# Patient Record
Sex: Male | Born: 1945 | Race: Black or African American | Hispanic: No | Marital: Married | Smoking: Former smoker
Health system: Southern US, Community
[De-identification: ages and names within clinical notes are randomized; demographics above are authoritative.]

## PROBLEM LIST (undated history)

## (undated) DIAGNOSIS — I671 Cerebral aneurysm, nonruptured: Secondary | ICD-10-CM

## (undated) DIAGNOSIS — R7303 Prediabetes: Secondary | ICD-10-CM

## (undated) DIAGNOSIS — I10 Essential (primary) hypertension: Secondary | ICD-10-CM

## (undated) DIAGNOSIS — E039 Hypothyroidism, unspecified: Secondary | ICD-10-CM

## (undated) DIAGNOSIS — E119 Type 2 diabetes mellitus without complications: Secondary | ICD-10-CM

## (undated) DIAGNOSIS — R972 Elevated prostate specific antigen [PSA]: Secondary | ICD-10-CM

## (undated) DIAGNOSIS — Z7901 Long term (current) use of anticoagulants: Secondary | ICD-10-CM

## (undated) DIAGNOSIS — Z8719 Personal history of other diseases of the digestive system: Secondary | ICD-10-CM

## (undated) DIAGNOSIS — E785 Hyperlipidemia, unspecified: Secondary | ICD-10-CM

## (undated) DIAGNOSIS — F32A Depression, unspecified: Secondary | ICD-10-CM

## (undated) DIAGNOSIS — F419 Anxiety disorder, unspecified: Secondary | ICD-10-CM

## (undated) DIAGNOSIS — E079 Disorder of thyroid, unspecified: Secondary | ICD-10-CM

## (undated) HISTORY — DX: Elevated prostate specific antigen (PSA): R97.20

## (undated) HISTORY — DX: Long term (current) use of anticoagulants: Z79.01

## (undated) HISTORY — DX: Anxiety disorder, unspecified: F41.9

## (undated) HISTORY — DX: Hyperlipidemia, unspecified: E78.5

## (undated) HISTORY — PX: TONSILLECTOMY: SUR1361

## (undated) HISTORY — DX: Depression, unspecified: F32.A

---

## 2007-07-06 DIAGNOSIS — G459 Transient cerebral ischemic attack, unspecified: Secondary | ICD-10-CM

## 2007-07-06 HISTORY — DX: Transient cerebral ischemic attack, unspecified: G45.9

## 2011-10-11 DIAGNOSIS — I10 Essential (primary) hypertension: Secondary | ICD-10-CM | POA: Insufficient documentation

## 2011-10-11 DIAGNOSIS — E039 Hypothyroidism, unspecified: Secondary | ICD-10-CM | POA: Insufficient documentation

## 2011-10-11 DIAGNOSIS — F419 Anxiety disorder, unspecified: Secondary | ICD-10-CM | POA: Insufficient documentation

## 2011-10-11 DIAGNOSIS — G4700 Insomnia, unspecified: Secondary | ICD-10-CM | POA: Insufficient documentation

## 2013-10-31 DIAGNOSIS — R42 Dizziness and giddiness: Secondary | ICD-10-CM | POA: Insufficient documentation

## 2013-10-31 LAB — HM HEPATITIS C SCREENING LAB: HM Hepatitis Screen: POSITIVE

## 2015-01-08 LAB — HM COLONOSCOPY

## 2016-08-17 DIAGNOSIS — R457 State of emotional shock and stress, unspecified: Secondary | ICD-10-CM | POA: Insufficient documentation

## 2019-07-02 LAB — PSA: PSA: 4.39

## 2020-04-24 HISTORY — PX: CEREBRAL ANGIOGRAM: SHX1326

## 2020-05-27 DIAGNOSIS — G459 Transient cerebral ischemic attack, unspecified: Secondary | ICD-10-CM | POA: Insufficient documentation

## 2020-05-27 DIAGNOSIS — I671 Cerebral aneurysm, nonruptured: Secondary | ICD-10-CM | POA: Insufficient documentation

## 2020-08-08 HISTORY — PX: SP EMBOLIZATION INTRACRANIAL: HXRAD103

## 2020-08-08 HISTORY — PX: OTHER SURGICAL HISTORY: SHX169

## 2020-08-10 ENCOUNTER — Other Ambulatory Visit: Payer: Self-pay

## 2020-08-10 ENCOUNTER — Emergency Department
Admission: EM | Admit: 2020-08-10 | Discharge: 2020-08-10 | Disposition: A | Discharge status: Home / Self Care | Payer: Medicare Other | Attending: Emergency Medicine | Admitting: Emergency Medicine

## 2020-08-10 DIAGNOSIS — I10 Essential (primary) hypertension: Secondary | ICD-10-CM | POA: Insufficient documentation

## 2020-08-10 DIAGNOSIS — R339 Retention of urine, unspecified: Secondary | ICD-10-CM | POA: Insufficient documentation

## 2020-08-10 DIAGNOSIS — R1030 Lower abdominal pain, unspecified: Secondary | ICD-10-CM | POA: Diagnosis not present

## 2020-08-10 DIAGNOSIS — Z87891 Personal history of nicotine dependence: Secondary | ICD-10-CM | POA: Insufficient documentation

## 2020-08-10 HISTORY — DX: Disorder of thyroid, unspecified: E07.9

## 2020-08-10 HISTORY — DX: Essential (primary) hypertension: I10

## 2020-08-10 LAB — URINALYSIS, COMPLETE (UACMP) WITH MICROSCOPIC
Bacteria, UA: NONE SEEN
Bilirubin Urine: NEGATIVE
Glucose, UA: NEGATIVE mg/dL
Ketones, ur: NEGATIVE mg/dL
Leukocytes,Ua: NEGATIVE
Nitrite: NEGATIVE
Protein, ur: NEGATIVE mg/dL
Specific Gravity, Urine: 1.012 (ref 1.005–1.030)
Squamous Epithelial / HPF: NONE SEEN (ref 0–5)
pH: 6 (ref 5.0–8.0)

## 2020-08-10 NOTE — ED Provider Notes (Signed)
[facility] Emergency Department Provider Note   ____________________________________________   Event Date/Time   First MD Initiated Contact with Patient 08/10/20 1020     (approximate)  I have reviewed the triage vital signs and the nursing notes.   HISTORY  Chief Complaint Urinary Retention    HPI Christopher Arroyo is a 75 y.o. male the stated past medical history of hypertension and thyroid diseases presents for urinary retention over the past 24 hours.  Patient states that he was admitted to [facility] for a endarterectomy of his carotid and had a Foley placed for this procedure.  Patient states that when the Foley catheter came out he has peed once before discharge and then has been unable to urinate since last evening.  Patient states he does have a history of prostate issues and complains of suprapubic abdominal pain that he describes as "stretching" that does not radiate and is caused his abdomen to become swollen and hard.  Patient also relates that this pain radiates into his groin and penis.  Patient currently denies any vision changes, tinnitus, difficulty speaking, facial droop, sore throat, chest pain, shortness of breath, nausea/vomiting/diarrhea, dysuria, or weakness/numbness/paresthesias in any extremity         Past Medical History:  Diagnosis Date  . Hypertension   . Thyroid disease     There are no problems to display for this patient.   History reviewed. No pertinent surgical history.  Prior to Admission medications   Not on File    Allergies Patient has no known allergies.  History reviewed. No pertinent family history.  Social History Social History   Tobacco Use  . Smoking status: Former Smoker  Substance Use Topics  . Alcohol use: Not Currently    Review of Systems Constitutional: No fever/chills Eyes: No visual changes. ENT: No sore throat. Cardiovascular: Denies chest pain. Respiratory: Denies shortness of  breath. Gastrointestinal: Endorses abdominal pain.  No nausea, no vomiting.  No diarrhea. Genitourinary: Negative for dysuria.  Positive for urinary retention Musculoskeletal: Negative for acute arthralgias Skin: Negative for rash. Neurological: Negative for headaches, weakness/numbness/paresthesias in any extremity Psychiatric: Negative for suicidal ideation/homicidal ideation   ____________________________________________   PHYSICAL EXAM:  VITAL SIGNS: ED Triage Vitals  Enc Vitals Group     BP 08/10/20 1025 (!) 130/95     Pulse Rate 08/10/20 1025 85     Resp 08/10/20 1025 18     Temp --      Temp src --      SpO2 08/10/20 1025 100 %     Weight 08/10/20 1026 160 lb (72.6 kg)     Height 08/10/20 1026 5\' 8"  (1.727 m)     Head Circumference --      Peak Flow --      Pain Score 08/10/20 1025 7     Pain Loc --      Pain Edu? --      Excl. in GC? --    Constitutional: Alert and oriented. Well appearing and in no acute distress. Eyes: Conjunctivae are normal. PERRL. Head: Atraumatic. Nose: No congestion/rhinnorhea. Mouth/Throat: Mucous membranes are moist. Neck: No stridor Cardiovascular: Grossly normal heart sounds.  Good peripheral circulation. Respiratory: Normal respiratory effort.  No retractions. Gastrointestinal: Suprapubic abdominal distention with tenderness to palpation.  Soft. No distention. Musculoskeletal: No obvious deformities Neurologic:  Normal speech and language. No gross focal neurologic deficits are appreciated. Skin:  Skin is warm and dry. No rash noted. Psychiatric: Mood and affect are  normal. Speech and behavior are normal.  ____________________________________________   LABS (all labs ordered are listed, but only abnormal results are displayed)  Labs Reviewed  URINALYSIS, COMPLETE (UACMP) WITH MICROSCOPIC - Abnormal; Notable for the following components:      Result Value   Color, Urine YELLOW (*)    APPearance CLEAR (*)    Hgb urine dipstick  LARGE (*)    All other components within normal limits    PROCEDURES  Procedure(s) performed (including Critical Care):  .1-3 Lead EKG Interpretation Performed by: Merwyn Katos, MD Authorized by: Merwyn Katos, MD     Interpretation: normal     ECG rate:  84   ECG rate assessment: normal     Rhythm: sinus rhythm     Ectopy: none     Conduction: normal       ____________________________________________   INITIAL IMPRESSION / ASSESSMENT AND PLAN / ED COURSE  As part of my medical decision making, I reviewed the following data within the electronic MEDICAL RECORD NUMBER Nursing notes reviewed and incorporated, Labs reviewed, Old chart reviewed, and Notes from prior ED visits reviewed and incorporated        75 year old male with one day of acute urinary retention. Well appearing/nonseptic. Tolerating PO.  ED Workup: UA ED Interventions: Catheter placement  Patient exam and history not consistent with cauda equina, infectious etiology, constipation based retention/intraabdominal mass/AAA, trauma, nephro/urolithiasis, drug reaction, cancer.  Disposition: Discharge with catheter placed to leg bag and urology follow up within 1 week. Strict return precautions and catheter care discussed.      ____________________________________________   FINAL CLINICAL IMPRESSION(S) / ED DIAGNOSES  Final diagnoses:  Urinary retention     ED Discharge Orders    None       Note:  This document was prepared using Dragon voice recognition software and may include unintentional dictation errors.   Merwyn Katos, MD 08/10/20 [phone]

## 2020-08-10 NOTE — ED Triage Notes (Signed)
Pt arrives to ER via [facility] for urinary retention. Had surgery yest at [facility]. Had a foley, removed, was able to urinate before being DC. States last urination was 3pm yesterday. A&O, ambulatory. Hx prostate issues. States abd swollen and hard. States groin and penis pain as well. EMS VSS. 124/91, HR 56, 97% RA

## 2020-08-13 ENCOUNTER — Telehealth: Payer: Self-pay

## 2020-08-13 ENCOUNTER — Encounter: Payer: Self-pay | Admitting: Urology

## 2020-08-13 ENCOUNTER — Ambulatory Visit (INDEPENDENT_AMBULATORY_CARE_PROVIDER_SITE_OTHER): Payer: Medicare Other | Admitting: Physician Assistant

## 2020-08-13 ENCOUNTER — Other Ambulatory Visit: Payer: Self-pay

## 2020-08-13 ENCOUNTER — Ambulatory Visit: Payer: Medicare Other | Admitting: Physician Assistant

## 2020-08-13 VITALS — BP 160/104 | HR 71 | Ht 68.0 in | Wt 160.0 lb

## 2020-08-13 DIAGNOSIS — N401 Enlarged prostate with lower urinary tract symptoms: Secondary | ICD-10-CM

## 2020-08-13 DIAGNOSIS — N138 Other obstructive and reflux uropathy: Secondary | ICD-10-CM

## 2020-08-13 DIAGNOSIS — R3129 Other microscopic hematuria: Secondary | ICD-10-CM | POA: Diagnosis not present

## 2020-08-13 DIAGNOSIS — R972 Elevated prostate specific antigen [PSA]: Secondary | ICD-10-CM | POA: Diagnosis not present

## 2020-08-13 DIAGNOSIS — R339 Retention of urine, unspecified: Secondary | ICD-10-CM

## 2020-08-13 NOTE — Progress Notes (Signed)
Patient returned to clinic this afternoon with reports of difficulty urinating and lower abdominal discomfort.  He was offered Foley catheter replacement versus CIC teaching and elected for the former, see separate procedure note below.  He reported resolution of lower abdominal pain with Foley placement. We will plan for repeat voiding trial in 1 week on Flomax 0.8mg  daily (previously 0.4mg ).  Simple Catheter Placement  Due to urinary retention patient is present today for a foley cath placement.  Patient was cleaned and prepped in a sterile fashion with betadine. A 16 FR coude foley catheter was inserted, urine return was noted  >357ml, urine was yellow in color.  The balloon was filled with 10cc of sterile water.  A leg bag was attached for drainage. Patient tolerated well, no complications were noted   Performed by: Eligha Bridegroom, CMA  Additional notes/ Follow up: Repeat VT 1 week, double Flomax

## 2020-08-13 NOTE — Progress Notes (Signed)
08/13/2020 9:16 AM   Christopher Arroyo [birth year] [id number]  Referring provider: No referring provider defined for this encounter.  Chief Complaint  Patient presents with  . Urinary Retention    HPI: 75 year old gentleman status post endarterectomy who developed postoperative urinary retention.  He presented to the emergency room on 08/10/2020 unable to urinate.  He was able to urinate x1 prior to being discharged but then had difficulty thereafter.  He did have a Foley intraoperatively.  Urinalysis in the emergency room showed no evidence of infection, he did have incidental 6-10 red blood cells per high-power field otherwise unremarkable.  He reports that he was started on Flomax by his PCP about a year ago.  He reports that he was having occasional difficulty getting his stream started at the time.  His not had any recent issues.  He also mentions that that he was told his PSA was started to go up a little bit last year.  He is not seen his PCP this year to have this rechecked.  Review of records indicate that his PSA has been as high as 4.97 on 05/2018.  This is slowly trending back downwards to 4.02 on 06/24/2019.  He is never previously seen a urologist.  He is very anxious to have his catheter removed.  Has been leaking around the catheter.  Voiding trial today was completed, only 100 cc was able to be instilled he had a large bladder spasm and empties completely.   PMH: Past Medical History:  Diagnosis Date  . Hypertension   . Thyroid disease     Surgical History: History reviewed. No pertinent surgical history.  Home Medications:  Allergies as of 08/13/2020   No Known Allergies     Medication List       Accurate as of August 13, 2020  9:16 AM. If you have any questions, ask your nurse or doctor.        aspirin 81 MG chewable tablet Chew by mouth.   citalopram 20 MG tablet Commonly known as: CELEXA Take by mouth.   clopidogrel 75 MG tablet Commonly  known as: PLAVIX PLEASE SEE ATTACHED FOR DETAILED DIRECTIONS   Fluoxetine HCl (PMDD) 10 MG Tabs Take by mouth.   levothyroxine 75 MCG tablet Commonly known as: SYNTHROID PLEASE SEE ATTACHED FOR DETAILED DIRECTIONS   lisinopril-hydrochlorothiazide 20-25 MG tablet Commonly known as: ZESTORETIC Take 1 tablet by mouth daily.   Multi-Vitamin tablet Take 1 tablet by mouth daily.   oxyCODONE 5 MG immediate release tablet Commonly known as: Oxy IR/ROXICODONE Take by mouth.   rosuvastatin 5 MG tablet Commonly known as: CRESTOR Take 5 mg by mouth daily.   tamsulosin 0.4 MG Caps capsule Commonly known as: FLOMAX TAKE 1 CAPSULE BY MOUTH ONCE DAILY. TAKE 30 MINUTES AFTER SAME MEAL EACH DAY   traZODone 50 MG tablet Commonly known as: DESYREL TAKE 1 - 2 TABLETS BY MOUTH NIGHTLY.       Allergies: No Known Allergies  Family History: History reviewed. No pertinent family history.  Social History:  reports that he has quit smoking. He does not have any smokeless tobacco history on file. He reports previous alcohol use. No history on file for drug use.   Physical Exam: BP (!) 160/104   Pulse 71   Ht 5\' 8"  (1.727 m)   Wt 160 lb (72.6 kg)   BMI 24.33 kg/m   Constitutional:  Alert and oriented, No acute distress. HEENT: [place] AT, moist mucus membranes.  Trachea midline, no  masses. Cardiovascular: No clubbing, cyanosis, or edema. Respiratory: Normal respiratory effort, no increased work of breathing. Skin: No rashes, bruises or suspicious lesions. Neurologic: Grossly intact, no focal deficits, moving all 4 extremities. Psychiatric: Normal mood and affect.  Assessment & Plan:    1. Urinary retention Postoperative urinary retention likely multifactorial including underlying history of BPH  Successful voiding trial today, strict urinary retention precautions reviewed.  Advised to return to our clinic later this afternoon is if if he is having any difficulty urinating  whatsoever.  Plan to reassess his urinary symptoms in a few weeks to assess for any underlying incomplete bladder emptying, uncontrolled symptoms, as well as update his prostate cancer screening especially in the setting of a previously elevated PSA.  He is agreeable this plan.  Continue Flomax   2. Benign prostatic hyperplasia with urinary obstruction As above  3. Elevated PSA As above  4. Microscopic hematuria Incidental microscopic hematuria in the emergency room, likely secondary to catheter trauma from previous Foley  We'll repeat urinalysis at next visit to ensure that he doesn't need a hematuria evaluation   Return to care in 4 weeks for IPSS/PSA/DRE/PVR/ UA    Vanna Scotland, MD  [facility] [address] [place], [place] [address] [phone]

## 2020-08-13 NOTE — Telephone Encounter (Signed)
Pt called stating he is unable to urinate, has strong urge but is only dribbling. Patient had V+T this morning with Dr. Apolinar Junes. Scheduled pt to come in at 2 pm . Pt verbalized understanding.

## 2020-08-13 NOTE — Progress Notes (Signed)
Fill and Pull Catheter Removal  Patient is present today for a catheter removal.  Patient was cleaned and prepped in a sterile fashion 100 ml of sterile water/ saline was instilled into the bladder when the patient felt the urge to urinate. 10 ml of water was then drained from the balloon.  A 16 FR foley cath was removed from the bladder no complications were noted .  Patient as then given some time to void on their own.  Patient can void  100 ml on their own after some time.  Patient tolerated well.  Performed by: Gerarda Gunther, RMA  Follow up/ Additional notes:

## 2020-08-18 ENCOUNTER — Ambulatory Visit: Payer: Medicare Other | Admitting: Physician Assistant

## 2020-08-18 ENCOUNTER — Ambulatory Visit: Payer: Medicare Other | Admitting: Urology

## 2020-08-19 ENCOUNTER — Other Ambulatory Visit: Payer: Self-pay

## 2020-08-19 ENCOUNTER — Ambulatory Visit (INDEPENDENT_AMBULATORY_CARE_PROVIDER_SITE_OTHER): Payer: Medicare Other | Admitting: Physician Assistant

## 2020-08-19 ENCOUNTER — Ambulatory Visit: Payer: Medicare Other | Admitting: Physician Assistant

## 2020-08-19 DIAGNOSIS — R339 Retention of urine, unspecified: Secondary | ICD-10-CM | POA: Diagnosis not present

## 2020-08-19 LAB — BLADDER SCAN AMB NON-IMAGING

## 2020-08-19 MED ORDER — TAMSULOSIN HCL 0.4 MG PO CAPS: 0.8000 mg | ORAL_CAPSULE | Freq: Every day | ORAL | 0 refills | Status: DC

## 2020-08-19 NOTE — Progress Notes (Signed)
08/19/2020 3:33 PM   Jon Lall [birth year] [id number]  CC: Chief Complaint  Patient presents with  . Urinary Retention    Voiding Trial    HPI: Christopher Arroyo is a 75 y.o. male who recently developed postoperative urinary retention following endarterectomy who presents today for repeat voiding trial.  He was first seen in clinic by Dr. Apolinar Junes 6 days ago for his first outpatient voiding trial, which he failed.  We have increased Flomax to 0.8 mg daily since that visit.    Today he reports moderate catheter discomfort. He has been tolerating increased Flomax without orthostasis.  PMH: Past Medical History:  Diagnosis Date  . Hypertension   . Thyroid disease     Surgical History: No past surgical history on file.  Home Medications:  Allergies as of 08/19/2020   No Known Allergies     Medication List       Accurate as of August 19, 2020  3:33 PM. If you have any questions, ask your nurse or doctor.        aspirin 81 MG chewable tablet Chew by mouth.   citalopram 20 MG tablet Commonly known as: CELEXA Take by mouth.   clopidogrel 75 MG tablet Commonly known as: PLAVIX PLEASE SEE ATTACHED FOR DETAILED DIRECTIONS   Fluoxetine HCl (PMDD) 10 MG Tabs Take by mouth.   levothyroxine 75 MCG tablet Commonly known as: SYNTHROID PLEASE SEE ATTACHED FOR DETAILED DIRECTIONS   lisinopril-hydrochlorothiazide 20-25 MG tablet Commonly known as: ZESTORETIC Take 1 tablet by mouth daily.   Multi-Vitamin tablet Take 1 tablet by mouth daily.   oxyCODONE 5 MG immediate release tablet Commonly known as: Oxy IR/ROXICODONE Take by mouth.   rosuvastatin 5 MG tablet Commonly known as: CRESTOR Take 5 mg by mouth daily.   tamsulosin 0.4 MG Caps capsule Commonly known as: FLOMAX Take 2 capsules (0.8 mg total) by mouth daily. What changed: See the new instructions. Changed by: [name], PA-C   traZODone 50 MG tablet Commonly known as: DESYREL TAKE 1 - 2  TABLETS BY MOUTH NIGHTLY.       Allergies:  No Known Allergies  Family History: No family history on file.  Social History:   reports that he has quit smoking. He does not have any smokeless tobacco history on file. He reports previous alcohol use. No history on file for drug use.  Physical Exam: There were no vitals taken for this visit.  Constitutional:  Alert and oriented, no acute distress, nontoxic appearing HEENT: [place], AT Cardiovascular: No clubbing, cyanosis, or edema Respiratory: Normal respiratory effort, no increased work of breathing Skin: No rashes, bruises or suspicious lesions Neurologic: Grossly intact, no focal deficits, moving all 4 extremities Psychiatric: Normal mood and affect  Laboratory Data: Results for orders placed or performed in visit on 08/19/20  BLADDER SCAN AMB NON-IMAGING  Result Value Ref Range   Scan Result    Assessment & Plan:   1. Urinary retention Only catheter removed in the morning, see separate procedure note for details.  Patient returned to clinic in the afternoon.  He reports drinking approximately 30 ounces of fluid.  He has been able to urinate.  PVR 193 mL.  Patient denies lower abdominal discomfort.  Voiding trial passed.  Counseled him to return to clinic with lower abdominal discomfort or the inability to void.  He expressed understanding.  Counseled him to continue Flomax 0.8 mg daily with plans for symptom recheck in 4 weeks with PVR, PSA, and DRE. -  BLADDER SCAN AMB NON-IMAGING - tamsulosin (FLOMAX) 0.4 MG CAPS capsule; Take 2 capsules (0.8 mg total) by mouth daily.  Dispense: 30 capsule; Refill: 0   Return in about 4 weeks (around 09/16/2020) for Symptom recheck with UA, PVR, PSA, DRE.  [name], PA-C  [facility] [address] [place], [place] [address] [phone]

## 2020-08-19 NOTE — Progress Notes (Signed)
Catheter Removal  Patient is present today for a catheter removal.  9ml of water was drained from the balloon. A 16FR coude foley cath was removed from the bladder no complications were noted . Patient tolerated well.  Performed by: Richanda Darin, PA-C   Follow up/ Additional notes: Push fluids and RTC this afternoon for PVR. 

## 2020-09-01 ENCOUNTER — Other Ambulatory Visit: Payer: Self-pay | Admitting: Physician Assistant

## 2020-09-01 DIAGNOSIS — R339 Retention of urine, unspecified: Secondary | ICD-10-CM

## 2020-09-02 DIAGNOSIS — E119 Type 2 diabetes mellitus without complications: Secondary | ICD-10-CM

## 2020-09-02 HISTORY — DX: Type 2 diabetes mellitus without complications: E11.9

## 2020-09-02 LAB — LIPID PANEL
Cholesterol: 211 — AB (ref 0–200)
HDL: 57 (ref 35–70)
LDL Cholesterol: 120
Triglycerides: 170 — AB (ref 40–160)

## 2020-09-10 ENCOUNTER — Encounter: Payer: Self-pay | Admitting: Urology

## 2020-09-10 ENCOUNTER — Ambulatory Visit: Payer: Self-pay | Admitting: Urology

## 2020-09-16 ENCOUNTER — Ambulatory Visit: Payer: Medicare Other | Admitting: Physician Assistant

## 2020-10-07 ENCOUNTER — Other Ambulatory Visit: Payer: Self-pay | Admitting: Physician Assistant

## 2020-10-07 DIAGNOSIS — R339 Retention of urine, unspecified: Secondary | ICD-10-CM

## 2020-10-29 ENCOUNTER — Ambulatory Visit: Payer: Medicare Other | Admitting: Internal Medicine

## 2020-11-14 ENCOUNTER — Ambulatory Visit (INDEPENDENT_AMBULATORY_CARE_PROVIDER_SITE_OTHER): Payer: Medicare Other | Admitting: Urology

## 2020-11-14 ENCOUNTER — Other Ambulatory Visit: Payer: Self-pay

## 2020-11-14 ENCOUNTER — Encounter: Payer: Self-pay | Admitting: Urology

## 2020-11-14 VITALS — BP 154/86 | HR 90

## 2020-11-14 DIAGNOSIS — R972 Elevated prostate specific antigen [PSA]: Secondary | ICD-10-CM

## 2020-11-14 DIAGNOSIS — R339 Retention of urine, unspecified: Secondary | ICD-10-CM | POA: Diagnosis not present

## 2020-11-14 DIAGNOSIS — R3129 Other microscopic hematuria: Secondary | ICD-10-CM

## 2020-11-14 DIAGNOSIS — N401 Enlarged prostate with lower urinary tract symptoms: Secondary | ICD-10-CM

## 2020-11-14 DIAGNOSIS — N138 Other obstructive and reflux uropathy: Secondary | ICD-10-CM

## 2020-11-14 LAB — BLADDER SCAN AMB NON-IMAGING: Scan Result: 507

## 2020-11-14 NOTE — Progress Notes (Addendum)
11/14/2020 11:06 AM   Christopher Arroyo 20-Apr-1946 [id number]  Referring provider: No referring provider defined for this encounter.  Chief Complaint  Patient presents with  . Urinary Retention        Urological history: 1. Urinary retention -postoperative urinary retention after an endarterectomy 08/2020  2. Elevated PSA -PSA Trend  4.02 in 06/2019  4.39 in 06/2018  4.97 in 05/2018  3.93 in 06/2017  3. BPH -managed with tamsulosin 0.8 mg daily  4. High risk hematuria -former smoker -micro heme with Foley catheter   HPI: Christopher Arroyo is a 75 y.o. male who presents today for the inability to urinate.    He had failed a voiding trial on 08/13/2020, but he had a successful trial on 08/19/2020.  He was instructed to return to the office in one month for a recheck on his UA, PSA, I PSS, PVR and exam, but he did not present for that appointment.     He was under the impression he was told that everything was fine and he did not need any further follow up.    He states that he had been having difficulty with urination, a weak urinary stream and lower back pain.  He states he has been taking the tamsulosin 0.4 mg, not the 0.8 mg that was recommended.  This morning he could not urinate.  He is now having intense suprapubic pain.    Patient denies any modifying or aggravating factors.  Patient denies any gross hematuria, dysuria or flank pain.  Patient denies any fevers, chills, nausea or vomiting.   His PVR is 507 mL.    PMH: Past Medical History:  Diagnosis Date  . Hypertension   . Thyroid disease     Surgical History: No past surgical history on file.  Home Medications:  Allergies as of 11/14/2020   No Known Allergies     Medication List       Accurate as of Nov 14, 2020 11:59 PM. If you have any questions, ask your nurse or doctor.        citalopram 20 MG tablet Commonly known as: CELEXA Take by mouth.   clopidogrel 75 MG tablet Commonly known as:  PLAVIX PLEASE SEE ATTACHED FOR DETAILED DIRECTIONS   Fluoxetine HCl (PMDD) 10 MG Tabs Take by mouth.   levothyroxine 75 MCG tablet Commonly known as: SYNTHROID PLEASE SEE ATTACHED FOR DETAILED DIRECTIONS   lisinopril-hydrochlorothiazide 20-25 MG tablet Commonly known as: ZESTORETIC Take 1 tablet by mouth daily.   Multi-Vitamin tablet Take 1 tablet by mouth daily.   oxyCODONE 5 MG immediate release tablet Commonly known as: Oxy IR/ROXICODONE Take by mouth.   rosuvastatin 5 MG tablet Commonly known as: CRESTOR Take 5 mg by mouth daily.   tamsulosin 0.4 MG Caps capsule Commonly known as: FLOMAX TAKE 2 CAPSULES BY MOUTH EVERY DAY   traZODone 50 MG tablet Commonly known as: DESYREL TAKE 1 - 2 TABLETS BY MOUTH NIGHTLY.       Allergies: No Known Allergies  Family History: No family history on file.  Social History:  reports that he has quit smoking. He has never used smokeless tobacco. He reports previous alcohol use. No history on file for drug use.  ROS: Pertinent ROS in HPI  Physical Exam: BP (!) 154/86   Pulse 90   Constitutional:  Well nourished. Alert and oriented, No acute distress. HEENT: [place] AT, mask in place.  Trachea midline Cardiovascular: No clubbing, cyanosis, or edema. Respiratory: Normal respiratory effort,  no increased work of breathing. GU: No CVA tenderness.  No bladder fullness or masses.  Patient with uncircumcised phallus. Foreskin easily retracted  Urethral meatus is patent.  No penile discharge. No penile lesions or rashes. Scrotum without lesions, cysts, rashes and/or edema.   Neurologic: Grossly intact, no focal deficits, moving all 4 extremities. Psychiatric: Normal mood and affect.  Laboratory Data: Hemoglobin A1C 4.8 - 5.6 % 6.5High   Comment: A1c Glycemic Goal: <7.0%   **Goals should be individualized; more or less stringent A1c glycemic goals may be appropriate for individual patients.    (Adopted from: 2020 ADA Standards of  Medical Care In Diabetes)  Point of Care A1c testing is not FDA-approved for the diagnosis of Diabetes.  Estimated Average Glucose mg/dL 417   Resulting Agency  [facility]   Narrative Performed by [facility] Screening or Diagnosis of Diabetes Mellitus*  A1c Reference Interval    Interpretation  4.8 - 5.6           Normal  5.7 - 6.4           Dysglycemia  >6.4             Diabetes Mellitus   *Not recommended for diagnosis of diabetes in children with Cystic Fibrosis or with symptoms suggestive of acute onset type 1 diabetes.    A1c Glycemic Goal: <7.0 %   **Goals should be individualized; more or less stringent A1c glycemic goals may be appropriate for individual patients.  (Adopted from: 2020 ADA Standards of Medical Care In Diabetes)  Specimen Collected: 09/02/20 14:24 Last Resulted: 09/02/20 17:26  Received From: [facility]  Result Received: 10/08/20 07:41   Triglycerides 0 - 150 mg/dL [id number]  [facility]  Cholesterol <=200 mg/dL [id number]  [facility]  HDL 40 - 60 mg/dL 57  [facility]  LDL Calculated 40 - 99 mg/dL [id number]  [facility]  Comment: NHLBI Recommended Ranges, LDL Cholesterol, for Adults (20+yrs) (ATPIII), mg/dL  Optimal       <858  Near Optimal    100-129  Borderline High   130-159  High        160-189  Very High      >=190  NHLBI Recommended Ranges, LDL Cholesterol, for Children (2-19 yrs), mg/dL  Desirable      <850  Borderline High   110-129  High         >=130  VLDL Cholesterol Cal 12 - 42 mg/dL 34  [facility]  Chol/HDL Ratio 1.0 - 4.5 3.7  [facility]  Non-HDL Cholesterol 70 - 130 mg/dL [id number]  [facility]  Comment: Non-HDL Cholesterol Recommended  Ranges (mg/dL)  Optimal   <786  Near Optimal 130 - 159  Borderline High 160 - 189  High      190 - 219  Very High   >220  FASTING  Unknown  [facility]  Specimen Collected: 09/02/20 14:24 Last Resulted: 09/02/20 16:57  Received From: [facility]  Result Received: 10/08/20 07:41   Urinalysis    Component Value Date/Time   COLORURINE YELLOW (A) 08/10/2020 1023   APPEARANCEUR Cloudy (A) 11/17/2020 1604   LABSPEC 1.012 08/10/2020 1023   PHURINE 6.0 08/10/2020 1023   GLUCOSEU Negative 11/17/2020 1604   HGBUR LARGE (A) 08/10/2020 1023   BILIRUBINUR Negative 11/17/2020 1604   KETONESUR NEGATIVE 08/10/2020 1023   PROTEINUR  1+ (A) 11/17/2020 1604   PROTEINUR NEGATIVE 08/10/2020 1023   NITRITE Negative 11/17/2020 1604   NITRITE NEGATIVE 08/10/2020 1023   LEUKOCYTESUR Trace (A) 11/17/2020 1604   LEUKOCYTESUR NEGATIVE 08/10/2020 1023  I have reviewed the labs.   Pertinent Imaging: Results for JACHOB, MCCLEAN (MRN [id number]) as of 11/24/2020 19:52  Ref. Range 11/14/2020 09:38  Scan Result Unknown [id number]     Simple Catheter Placement Due to urinary retention patient is present today for a foley cath placement.  Patient was cleaned and prepped in a sterile fashion with betadine. A 16 FR Coude foley catheter was inserted, urine return was noted  550 ml, urine was yellow in color.  The balloon was filled with 10cc of sterile water. Foreskin was pulled over glans.  A leg bag was attached for drainage. Patient was also given a night bag to take home and was given instruction on how to change from one bag to another.  Patient was given instruction on proper catheter care.  Patient tolerated well, no complications were noted    Assessment & Plan:    1. Urinary retention -explained to the patient that he was to return in March for a follow up visit and he needed to be taking tamsulosin 0.8 mg daily -Foley placed today -he is wanting further explanation as  to why he continues to have issues with urination and I have recommended that he undergo a cystoscopy and TRUS -I have explained to the patient that they will  be scheduled for a cystoscopy in our office to evaluate their bladder.  The cystoscopy consists of passing a tube with a lens up through their urethra and into their urinary bladder.   We will inject the urethra with a lidocaine gel prior to introducing the cystoscope to help with any discomfort during the procedure.   After the procedure, they might experience blood in the urine and discomfort with urination.  This will abate after the first few voids.  I have  encouraged the patient to increase water intake  during this time.  Patient denies any allergies to lidocaine.  -explained that the TRUS involved a rectal probe to ultrasound the prostate   2. Elevated PSA -patient still needs a follow up PSA  3. BPH -continue tamsulosin 0.8 mg daily -TRUS pending   4. High risk hematuria -micro heme likely due to catheter trauma -will need follow up UA at some point    Return for cysto/TRUS for urinary retention .  These notes generated with voice recognition software. I apologize for typographical errors.  Michiel Cowboy, PA-C  [facility] [address]  [address] [place], [place] [address] [phone]

## 2020-11-17 ENCOUNTER — Ambulatory Visit: Payer: Medicare Other | Admitting: Physician Assistant

## 2020-11-17 ENCOUNTER — Ambulatory Visit (INDEPENDENT_AMBULATORY_CARE_PROVIDER_SITE_OTHER): Payer: Medicare Other | Admitting: Physician Assistant

## 2020-11-17 ENCOUNTER — Other Ambulatory Visit: Payer: Self-pay

## 2020-11-17 VITALS — BP 142/78 | HR 71 | Temp 98.0°F | Ht 68.0 in | Wt 152.0 lb

## 2020-11-17 DIAGNOSIS — N3289 Other specified disorders of bladder: Secondary | ICD-10-CM | POA: Diagnosis not present

## 2020-11-17 LAB — BLADDER SCAN AMB NON-IMAGING

## 2020-11-17 NOTE — Progress Notes (Signed)
11/17/2020 1:12 PM   Christopher Arroyo Nov 04, 1945 [id number]  CC: Chief Complaint  Patient presents with  . Urinary Retention    HPI: Christopher Arroyo is a 75 y.o. male with PMH BPH, elevated PSA, and recurrent urinary retention who presents today for evaluation of bladder spasms.  He was seen in clinic most recently 3 days ago by Christopher Arroyo and was found to be in acute urinary retention.  Foley catheter was placed at that time.  Today he reports intermittent sensations of urinary urgency and burning at the tip of his penis associated with urine leaking around his Foley catheter tubing.  He feels he has no urinary control and is quite uncomfortable. He denies fever, chills, nausea, and vomiting. Bladder scan with 44mL with Foley in place.  He states he was voiding without difficulty until the morning that he was seen in clinic, when he suddenly lost the ability to void. He denies dysuria associated with this.  He has resumed Flomax 0.8 mg daily per Christopher Arroyo's instructions last week.  He had been taking 0.4 mg daily leading up to this, despite having previously been increased to 0.8mg  daily.  PMH: Past Medical History:  Diagnosis Date  . Hypertension   . Thyroid disease     Surgical History: No past surgical history on file.  Home Medications:  Allergies as of 11/17/2020   No Known Allergies     Medication List       Accurate as of Nov 17, 2020  1:12 PM. If you have any questions, ask your nurse or doctor.        citalopram 20 MG tablet Commonly known as: CELEXA Take by mouth.   clopidogrel 75 MG tablet Commonly known as: PLAVIX PLEASE SEE ATTACHED FOR DETAILED DIRECTIONS   Fluoxetine HCl (PMDD) 10 MG Tabs Take by mouth.   levothyroxine 75 MCG tablet Commonly known as: SYNTHROID PLEASE SEE ATTACHED FOR DETAILED DIRECTIONS   lisinopril-hydrochlorothiazide 20-25 MG tablet Commonly known as: ZESTORETIC Take 1 tablet by mouth daily.   Multi-Vitamin tablet Take  1 tablet by mouth daily.   oxyCODONE 5 MG immediate release tablet Commonly known as: Oxy IR/ROXICODONE Take by mouth.   rosuvastatin 5 MG tablet Commonly known as: CRESTOR Take 5 mg by mouth daily.   tamsulosin 0.4 MG Caps capsule Commonly known as: FLOMAX TAKE 2 CAPSULES BY MOUTH EVERY DAY   traZODone 50 MG tablet Commonly known as: DESYREL TAKE 1 - 2 TABLETS BY MOUTH NIGHTLY.       Allergies:  No Known Allergies  Family History: No family history on file.  Social History:   reports that he has quit smoking. He has never used smokeless tobacco. He reports previous alcohol use. No history on file for drug use.  Physical Exam: BP (!) 142/78   Pulse 71   Temp 98 F (36.7 C) (Oral)   Ht 5\' 8"  (1.727 m)   Wt 152 lb (68.9 kg)   SpO2 98%   BMI 23.11 kg/m   Constitutional:  Alert and oriented, no acute distress, nontoxic appearing HEENT: Christopher Arroyo, AT Cardiovascular: No clubbing, cyanosis, or edema Respiratory: Normal respiratory effort, no increased work of breathing Skin: No rashes, bruises or suspicious lesions Neurologic: Grossly intact, no focal deficits, moving all 4 extremities Psychiatric: Normal mood and affect  Laboratory Data: Results for orders placed or performed in visit on 11/17/20  BLADDER SCAN AMB NON-IMAGING  Result Value Ref Range   Scan Result 8mL    Scan Result  96mL    Assessment & Plan:   1. Bladder spasm Patient experiencing painful bladder spasms and urinary leakage following Foley catheter placement 3 days ago for management of recurrent acute urinary retention.  He is now back on previously prescribed Flomax 0.8 mg daily.  We discussed that his treatment options at this point include an early voiding trial versus initiation of anticholinergic medications for management of his bladder spasms.  Given that he has resumed twice daily Flomax, he would prefer to initiate a voiding trial and I think this is reasonable.  I removed his Foley catheter in  clinic this morning, see separate procedure note for details.    Patient returned to clinic this afternoon for repeat PVR.  He has been able to urinate. PVR WNL. Voiding trial passed. He reports some dysuria today that is progressively improving. Counseled patient to continue Flomax 0.8mg  daily and keep follow-up appt with Dr. Apolinar Arroyo for cystoscopy; he expressed understanding. Will send urine for culture today and treat per results if he has persistent dysuria. - BLADDER SCAN AMB NON-IMAGING - Urinalysis, Complete - CULTURE, URINE COMPREHENSIVE   Return if symptoms worsen or fail to improve.  Christopher Ching, PA-C  [facility] [address], [address] [place], [place] [address] [phone]

## 2020-11-17 NOTE — Patient Instructions (Signed)
Continue Flomax 0.8mg  (2 capsules) daily.

## 2020-11-17 NOTE — Progress Notes (Signed)
Catheter Removal  Patient is present today for a catheter removal.  9ml of water was drained from the balloon. A 16FR coude foley cath was removed from the bladder no complications were noted . Patient tolerated well.  Performed by: Makila Colombe, PA-C   Follow up/ Additional notes: Push fluids and RTC this afternoon for PVR. 

## 2020-11-20 LAB — URINALYSIS, COMPLETE
Bilirubin, UA: NEGATIVE
Glucose, UA: NEGATIVE
Ketones, UA: NEGATIVE
Nitrite, UA: NEGATIVE
Specific Gravity, UA: 1.02 (ref 1.005–1.030)
Urobilinogen, Ur: 0.2 mg/dL (ref 0.2–1.0)
pH, UA: 6 (ref 5.0–7.5)

## 2020-11-20 LAB — MICROSCOPIC EXAMINATION

## 2020-11-21 ENCOUNTER — Other Ambulatory Visit: Payer: Self-pay | Admitting: Physician Assistant

## 2020-11-21 LAB — CULTURE, URINE COMPREHENSIVE

## 2020-11-21 MED ORDER — SULFAMETHOXAZOLE-TRIMETHOPRIM 800-160 MG PO TABS: 1.0000 | ORAL_TABLET | Freq: Two times a day (BID) | ORAL | 0 refills | Status: AC

## 2020-11-21 NOTE — Progress Notes (Signed)
Sending in Bactrim DS BID x3 days to sterilize the urine in advance of scheduled cystoscopy next week. I notified the patient via telephone and he expressed understanding. He denies dysuria today and states he is doing well on Flomax 0.8mg  daily.

## 2020-11-24 DIAGNOSIS — G44009 Cluster headache syndrome, unspecified, not intractable: Secondary | ICD-10-CM | POA: Insufficient documentation

## 2020-11-24 DIAGNOSIS — B1920 Unspecified viral hepatitis C without hepatic coma: Secondary | ICD-10-CM | POA: Insufficient documentation

## 2020-11-24 DIAGNOSIS — R768 Other specified abnormal immunological findings in serum: Secondary | ICD-10-CM | POA: Insufficient documentation

## 2020-11-26 ENCOUNTER — Ambulatory Visit (INDEPENDENT_AMBULATORY_CARE_PROVIDER_SITE_OTHER): Payer: Medicare Other | Admitting: Urology

## 2020-11-26 ENCOUNTER — Other Ambulatory Visit: Payer: Self-pay

## 2020-11-26 VITALS — BP 117/78 | HR 67 | Ht 68.0 in | Wt 152.0 lb

## 2020-11-26 DIAGNOSIS — N138 Other obstructive and reflux uropathy: Secondary | ICD-10-CM

## 2020-11-26 DIAGNOSIS — N401 Enlarged prostate with lower urinary tract symptoms: Secondary | ICD-10-CM

## 2020-11-26 DIAGNOSIS — Z87898 Personal history of other specified conditions: Secondary | ICD-10-CM

## 2020-11-26 NOTE — Patient Instructions (Signed)

## 2020-11-26 NOTE — Progress Notes (Signed)
11/26/20  No chief complaint on file.    HPI: 75 year old male with refractory urinary symptoms related to BPH who presents today to the office for cystoscopy and prostate sizing.   Please see previous notes for details.    He developed urinary retention in February and underwent multiple voiding trials.  He developed recurrent episode of retention in May.  He has been on Flomax in the interim.  He continues to have refractory symptoms.   Vitals:   11/26/20 0942  BP: 117/78  Pulse: 67   NED. A&Ox3.   No respiratory distress   Abd soft, NT, ND Normal phallus with bilateral descended testicles    Cystoscopy Procedure Note  Patient identification was confirmed, informed consent was obtained, and patient was prepped using Betadine solution.  Lidocaine jelly was administered per urethral meatus.    Preoperative abx where received prior to procedure.     Pre-Procedure: - Inspection reveals a normal caliber ureteral meatus.  Procedure: The flexible cystoscope was introduced without difficulty - No urethral strictures/lesions are present. - Enlarged prostate trilobar coaptation - Normal bladder neck - Bilateral ureteral obscured secondary to median lobe and elevated bladder neck - Bladder mucosa  reveals mild resolving catheter cystitis and posterior bladder wall otherwise no erythema or tumors - No bladder stones -Moderate trabeculation narrow posterior wall diverticulum  Retroflexion shows intravesical protrusion of lateral lobes as well as a discrete median lobe   Post-Procedure: - Patient tolerated the procedure well   Prostate transrectal ultrasound sizing   Informed consent was obtained after discussing risks/benefits of the procedure.  A time out was performed to ensure correct patient identity.   Pre-Procedure: -Transrectal probe was placed without difficulty -Transrectal Ultrasound performed revealing a 138.21 gm prostate measuring 6.56 x 5.56 x 7.23 cm  (length)     Assessment/ Plan:  1. Benign prostatic hyperplasia with urinary obstruction Lengthy discussion today about management options of his massive BPH and recurrent urinary retention  At this point, he would likely best served with an outlet procedure for the purpose of bladder preservation/health.  Alternatives including maximal pharmacotherapy were also reviewed.  We discussed alternatives including TURP vs. holmium laser enucleation of the prostate vs. greenlight laser ablation. Differences between the surgical procedures were discussed as well as the risks and benefits of each. He is most interested in HoLEP.  We discussed the common postoperative course following holep including need for overnight Foley catheter, temporary worsening of irritative voiding symptoms, and occasional stress incontinence which typically lasts up to 6 months but can persist. We discussed retrograde ejaculation and damage to surrounding structures including the urinary sphincter. Other uncommon complications including hematuria and urinary tract infection.  He understands all of the above and is willing to proceed as planned.  - Urinalysis, Complete - CULTURE, URINE COMPREHENSIVE  2. History of urinary retention Recurrent episodes, both provoked and unprovoked    Vanna Scotland, MD

## 2020-11-27 LAB — URINALYSIS, COMPLETE
Bilirubin, UA: NEGATIVE
Glucose, UA: NEGATIVE
Ketones, UA: NEGATIVE
Leukocytes,UA: NEGATIVE
Nitrite, UA: NEGATIVE
Protein,UA: NEGATIVE
RBC, UA: NEGATIVE
Specific Gravity, UA: 1.02 (ref 1.005–1.030)
Urobilinogen, Ur: 0.2 mg/dL (ref 0.2–1.0)
pH, UA: 6 (ref 5.0–7.5)

## 2020-11-27 LAB — MICROSCOPIC EXAMINATION
Bacteria, UA: NONE SEEN
RBC, Urine: NONE SEEN /hpf (ref 0–2)

## 2020-11-30 LAB — CULTURE, URINE COMPREHENSIVE

## 2020-12-03 ENCOUNTER — Telehealth: Payer: Self-pay | Admitting: *Deleted

## 2020-12-03 MED ORDER — NITROFURANTOIN MONOHYD MACRO 100 MG PO CAPS: ORAL_CAPSULE | ORAL | 0 refills | Status: DC

## 2020-12-03 NOTE — Telephone Encounter (Addendum)
Patient informed, voiced understanding. Sent in Macrobid to [facility].   ----- Message from Vanna Scotland, MD sent at 12/02/2020  8:22 AM EDT ----- This species is probably contaminant but as a precaution, go ahead treat him with Macrobid twice daily for 10 days starting 7 days prior to surgery.  Vanna Scotland, MD

## 2020-12-05 ENCOUNTER — Other Ambulatory Visit: Payer: Self-pay | Admitting: Urology

## 2020-12-05 DIAGNOSIS — N401 Enlarged prostate with lower urinary tract symptoms: Secondary | ICD-10-CM

## 2020-12-05 DIAGNOSIS — N138 Other obstructive and reflux uropathy: Secondary | ICD-10-CM

## 2020-12-15 ENCOUNTER — Encounter
Admission: RE | Admit: 2020-12-15 | Discharge: 2020-12-15 | Disposition: A | Discharge status: Home / Self Care | Payer: Medicare Other | Source: Ambulatory Visit | Attending: Urology | Admitting: Urology

## 2020-12-15 ENCOUNTER — Other Ambulatory Visit: Payer: Self-pay

## 2020-12-15 ENCOUNTER — Emergency Department
Admission: EM | Admit: 2020-12-15 | Discharge: 2020-12-15 | Disposition: A | Discharge status: Home / Self Care | Payer: Medicare Other | Attending: Emergency Medicine | Admitting: Emergency Medicine

## 2020-12-15 DIAGNOSIS — R339 Retention of urine, unspecified: Secondary | ICD-10-CM | POA: Insufficient documentation

## 2020-12-15 DIAGNOSIS — Z7982 Long term (current) use of aspirin: Secondary | ICD-10-CM | POA: Insufficient documentation

## 2020-12-15 DIAGNOSIS — Z7984 Long term (current) use of oral hypoglycemic drugs: Secondary | ICD-10-CM | POA: Insufficient documentation

## 2020-12-15 DIAGNOSIS — Z87891 Personal history of nicotine dependence: Secondary | ICD-10-CM | POA: Insufficient documentation

## 2020-12-15 DIAGNOSIS — E039 Hypothyroidism, unspecified: Secondary | ICD-10-CM | POA: Diagnosis not present

## 2020-12-15 DIAGNOSIS — I10 Essential (primary) hypertension: Secondary | ICD-10-CM | POA: Insufficient documentation

## 2020-12-15 HISTORY — DX: Depression, unspecified: F32.A

## 2020-12-15 HISTORY — DX: Personal history of other diseases of the digestive system: Z87.19

## 2020-12-15 HISTORY — DX: Prediabetes: R73.03

## 2020-12-15 LAB — URINALYSIS, COMPLETE (UACMP) WITH MICROSCOPIC
Bacteria, UA: NONE SEEN
Bilirubin Urine: NEGATIVE
Glucose, UA: NEGATIVE mg/dL
Hgb urine dipstick: NEGATIVE
Ketones, ur: NEGATIVE mg/dL
Leukocytes,Ua: NEGATIVE
Nitrite: NEGATIVE
Protein, ur: NEGATIVE mg/dL
Specific Gravity, Urine: 1.003 — ABNORMAL LOW (ref 1.005–1.030)
Squamous Epithelial / HPF: NONE SEEN (ref 0–5)
WBC, UA: NONE SEEN WBC/hpf (ref 0–5)
pH: 7 (ref 5.0–8.0)

## 2020-12-15 LAB — CBC WITH DIFFERENTIAL/PLATELET
Abs Immature Granulocytes: 0.03 10*3/uL (ref 0.00–0.07)
Basophils Absolute: 0 10*3/uL (ref 0.0–0.1)
Basophils Relative: 0 %
Eosinophils Absolute: 0.2 10*3/uL (ref 0.0–0.5)
Eosinophils Relative: 3 %
HCT: 37.7 % — ABNORMAL LOW (ref 39.0–52.0)
Hemoglobin: 12.8 g/dL — ABNORMAL LOW (ref 13.0–17.0)
Immature Granulocytes: 0 %
Lymphocytes Relative: 11 %
Lymphs Abs: 0.8 10*3/uL (ref 0.7–4.0)
MCH: 29.1 pg (ref 26.0–34.0)
MCHC: 34 g/dL (ref 30.0–36.0)
MCV: 85.7 fL (ref 80.0–100.0)
Monocytes Absolute: 0.5 10*3/uL (ref 0.1–1.0)
Monocytes Relative: 7 %
Neutro Abs: 5.4 10*3/uL (ref 1.7–7.7)
Neutrophils Relative %: 79 %
Platelets: 142 10*3/uL — ABNORMAL LOW (ref 150–400)
RBC: 4.4 MIL/uL (ref 4.22–5.81)
RDW: 13.7 % (ref 11.5–15.5)
WBC: 7 10*3/uL (ref 4.0–10.5)
nRBC: 0 % (ref 0.0–0.2)

## 2020-12-15 LAB — BASIC METABOLIC PANEL
Anion gap: 5 (ref 5–15)
BUN: 14 mg/dL (ref 8–23)
CO2: 26 mmol/L (ref 22–32)
Calcium: 8.3 mg/dL — ABNORMAL LOW (ref 8.9–10.3)
Chloride: 105 mmol/L (ref 98–111)
Creatinine, Ser: 0.94 mg/dL (ref 0.61–1.24)
GFR, Estimated: >60 mL/min (ref >60)
Glucose, Bld: 153 mg/dL — ABNORMAL HIGH (ref 70–99)
Potassium: 3.3 mmol/L — ABNORMAL LOW (ref 3.5–5.1)
Sodium: 136 mmol/L (ref 135–145)

## 2020-12-15 MED ORDER — LIDOCAINE HCL URETHRAL/MUCOSAL 2 % EX GEL
1.0000 "application " | Freq: Once | CUTANEOUS | Status: AC
  Administered 2020-12-15: 1 via URETHRAL
  Filled 2020-12-15: qty 10

## 2020-12-15 MED ORDER — CEPHALEXIN 500 MG PO CAPS: 500.0000 mg | ORAL_CAPSULE | Freq: Three times a day (TID) | ORAL | 0 refills | Status: AC

## 2020-12-15 MED ORDER — CEPHALEXIN 500 MG PO CAPS
500.0000 mg | ORAL_CAPSULE | Freq: Once | ORAL | Status: AC
  Administered 2020-12-15: 500 mg via ORAL
  Filled 2020-12-15: qty 1

## 2020-12-15 NOTE — Discharge Instructions (Addendum)
Continue your Flomax.  Take the antibiotics as prescribed 3 times a day for 5 days.  Call Dr. Delana Meyer office first thing in the morning to let her know that he had another episode of urinary retention.  Please follow the below instructions for Foley catheter.  Return to the emergency room for abdominal pain, flank pain, burning with urination or around the catheter, fever, nausea or vomiting.  Catheter care  Always wash your hands before and after touching your catheter.  Check the area around the urethra for inflammation or signs of infection. Signs of infection include irritated, swollen, red, or tender skin, or pus around the catheter.  Clean the area around the catheter with soap and water two times a day. Dry with a clean towel afterward.  Do not apply powder or lotion to the skin around the catheter.  To empty the urine collection bag  Wash your hands with soap and water.  Without touching the drain spout, remove the spout from its sleeve at the bottom of the collection bag. Open the valve on the spout.  Let the urine flow out of the bag and into the toilet or a container. Do not let the tubing or drain spout touch anything.  After you empty the bag, clean the end of the drain spout with tissue and water. Close the valve and put the drain spout back into its sleeve at the bottom of the collection bag.  Wash your hands with soap and water.

## 2020-12-15 NOTE — ED Triage Notes (Signed)
Pt states is here for urinary retention. Pt appears uncomfortable, states last was able to urinate was around 2.

## 2020-12-15 NOTE — ED Notes (Signed)
Pt immediately to restroom, will not allow vital signs at this time. [name], rn informed pt will need vital signs when he is out of restroom.

## 2020-12-15 NOTE — ED Provider Notes (Signed)
[facility] Emergency Department Provider Note  ____________________________________________  Time seen: Approximately 4:24 AM  I have reviewed the triage vital signs and the nursing notes.   HISTORY  Chief Complaint Urinary Retention   HPI Christopher Arroyo is a 75 y.o. male with a history of BPH who presents for evaluation of urinary retention.  Patient has been struggling on and off with urinary retention since having surgery February.  He is scheduled for a prostate resection.  His last Foley catheter was 2 weeks ago.  He was doing well until around 2 PM which is the last time he was able to urinate.  Patient arrives complaining of severe distention and lower abdominal pain for the last hour.  No nausea or vomiting, no dysuria, no fever or chills.  Patient endorses compliance with his Flomax.  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  Prior to Admission medications   Medication Sig Start Date End Date Taking? Authorizing Provider  cephALEXin (KEFLEX) 500 MG capsule Take 1 capsule (500 mg total) by mouth 3 (three) times daily for 5 days. 12/15/20 12/20/20 Yes Konstance Happel, [place], [place]  aspirin EC 81 MG tablet Take 81 mg by mouth daily. Swallow whole.    [provider]  b complex vitamins capsule Take 1 capsule by mouth daily.    [provider]  citalopram (CELEXA) 20 MG tablet Take 20 mg by mouth daily. 06/01/20   [provider]  clopidogrel (PLAVIX) 75 MG tablet Take 75 mg by mouth daily. 05/27/20   [provider]  dorzolamide-timolol (COSOPT) 22.3-6.8 MG/ML ophthalmic solution Place 1 drop into both eyes 2 (two) times daily.    [provider]  levothyroxine (SYNTHROID) 75 MCG tablet Take 75 mcg by mouth daily before breakfast. 06/24/20   [provider]  lisinopril-hydrochlorothiazide (ZESTORETIC) 20-25 MG tablet Take 1 tablet by mouth daily. 07/07/20   [provider]  metFORMIN (GLUCOPHAGE) 500 MG tablet Take 500 mg by mouth daily with breakfast.    [provider]  nitrofurantoin, macrocrystal-monohydrate, (MACROBID) 100 MG capsule Take 1 tablet twice a day for 10 days-start 7 days prior to surgery 12/03/20   Vanna Scotland, MD  rosuvastatin (CRESTOR) 20 MG tablet Take 20 mg by mouth daily. 03/21/20   [provider]  tamsulosin (FLOMAX) 0.4 MG CAPS capsule TAKE 2 CAPSULES BY MOUTH EVERY DAY Patient taking differently: Take 0.8 mg by mouth daily. 10/08/20   Vaillancourt, Lelon Mast, PA-C  traZODone (DESYREL) 50 MG tablet Take 50 mg by mouth at bedtime. 05/09/20   [provider]    Allergies Patient has no known allergies.  No family history on file.  Social History Social History   Tobacco Use   Smoking status: Former    Pack years: 0.00   Smokeless tobacco: Never  Substance Use Topics   Alcohol use: Not Currently    Review of Systems  Constitutional: Negative for fever. Eyes: Negative for visual changes. ENT:  Negative for sore throat. Neck: No neck pain  Cardiovascular: Negative for chest pain. Respiratory: Negative for shortness of breath. Gastrointestinal: Negative for abdominal pain, vomiting or diarrhea. Genitourinary: Negative for dysuria. + [place] retention Musculoskeletal: Negative for back pain. Skin: Negative for rash. Neurological: Negative for headaches, weakness or numbness. Psych: No SI or  HI  ____________________________________________   PHYSICAL EXAM:  VITAL SIGNS: ED Triage Vitals  Enc Vitals Group     BP 12/15/20 0408 (!) 142/106     Pulse Rate 12/15/20 0408 100     Resp 12/15/20 0359 20     Temp 12/15/20 0408 98.2 F (36.8 C)     Temp Source 12/15/20 0408 Oral     SpO2 --      Weight 12/15/20 0354 152 lb (68.9 kg)     Height 12/15/20 0354 5\' 8"  (1.727 m)     Head Circumference --      Peak Flow --      Pain Score --      Pain Loc --      Pain Edu? --      Excl. in GC? --     Constitutional: Alert and oriented, pacing in the room in significant discomfort HEENT:      Head: Normocephalic and atraumatic.         Eyes: Conjunctivae are normal. Sclera is non-icteric.       Mouth/Throat: Mucous membranes are moist.       Neck: Supple with no signs of meningismus. Cardiovascular: Regular rate and rhythm. No murmurs, gallops, or rubs.  Respiratory: Normal respiratory effort. Lungs are clear to auscultation bilaterally.  Gastrointestinal: Distended bladder with tenderness to palpation Genitourinary: Bladder scan showing 481 cc Musculoskeletal:  No edema, cyanosis, or erythema of extremities. Neurologic: Normal speech and language. Face is symmetric. Moving all extremities. No gross focal neurologic deficits are appreciated. Skin: Skin is warm, dry and intact. No rash noted. Psychiatric: Mood and affect are normal. Speech and behavior are normal.  ____________________________________________   LABS (all labs ordered are listed, but only abnormal results are displayed)  Labs Reviewed  BASIC METABOLIC PANEL - Abnormal; Notable for the following components:      Result Value   Potassium 3.3 (*)    Glucose, Bld 153 (*)    Calcium 8.3 (*)    All other components within normal limits  CBC WITH DIFFERENTIAL/PLATELET - Abnormal; Notable for the following components:   Hemoglobin 12.8 (*)    HCT 37.7 (*)    Platelets 142 (*)    All other components within  normal limits  URINALYSIS, COMPLETE (UACMP) WITH MICROSCOPIC - Abnormal; Notable for the following components:   Color, Urine YELLOW (*)    APPearance HAZY (*)    Specific Gravity, Urine 1.003 (*)    All other components within normal limits  URINE CULTURE   ____________________________________________  EKG  none  ____________________________________________  RADIOLOGY  none  ____________________________________________   PROCEDURES  Procedure(s) performed: None Procedures Critical Care performed:  None ____________________________________________   INITIAL IMPRESSION / ASSESSMENT AND PLAN / ED COURSE  75 y.o. male with a history of BPH who presents for evaluation of urinary retention.  Bladder scan showing 481 cc.  Patient pacing in the room in significant discomfort from distention.  A 16 French coud was placed with no difficulty.  UA and basic blood work has been sent to rule out overlying UTI or acute kidney injury.  Old medical records review including patient's several recent  visits to urology for BPH and intermittent urinary retention  _________________________ 5:29 AM on 12/15/2020 ----------------------------------------- Labs with no signs of UTI or acute kidney injury.  We will put patient on a short course of Keflex to prevent infection from developing.  Discussed Foley care and follow-up with urology.  Discussed my standard return precautions    _____________________________________________ Please note:  Patient was evaluated in Emergency Department today for the symptoms described in the history of present illness. Patient was evaluated in the context of the global COVID-19 pandemic, which necessitated consideration that the patient might be at risk for infection with the SARS-CoV-2 virus that causes COVID-19. Institutional protocols and algorithms that pertain to the evaluation of patients at risk for COVID-19 are in a state of rapid change based on information  released by regulatory bodies including the CDC and federal and state organizations. These policies and algorithms were followed during the patient's care in the ED.  Some ED evaluations and interventions may be delayed as a result of limited staffing during the pandemic.   [place] Controlled Substance Database was reviewed by me. ____________________________________________   FINAL CLINICAL IMPRESSION(S) / ED DIAGNOSES   Final diagnoses:  Urinary retention      NEW MEDICATIONS STARTED DURING THIS VISIT:  ED Discharge Orders          Ordered    cephALEXin (KEFLEX) 500 MG capsule  3 times daily        12/15/20 0528             Note:  This document was prepared using Dragon voice recognition software and may include unintentional dictation errors.    Don Perking, [place], MD 12/15/20 [phone]

## 2020-12-15 NOTE — Patient Instructions (Signed)
Your procedure is scheduled on: 6.23.2022 Report to DAY SURGERY DEPARTMENT LOCATED ON 2ND FLOOR MEDICAL MALL ENTRANCE. To find out your arrival time please call [phone] between 1PM - 3PM on 12/24/2020.  Remember: Instructions that are not followed completely may result in serious medical risk, up to and including death, or upon the discretion of your surgeon and anesthesiologist your surgery may need to be rescheduled.     _X__ 1. Do not eat food,or drink liquids after midnight the night before your procedure.                  __X__2.  On the morning of surgery brush your teeth with toothpaste and water, you                 may rinse your mouth with mouthwash if you wish.  Do not swallow any              toothpaste of mouthwash.     _X__ 3.  No Alcohol for 24 hours before or after surgery.   _X__ 4.  Do Not Smoke or use e-cigarettes For 24 Hours Prior to Your Surgery.                 Do not use any chewable tobacco products for at least 6 hours prior to                 surgery.  ____  5.  Bring all medications with you on the day of surgery if instructed.   __X__  6.  Notify your doctor if there is any change in your medical condition      (cold, fever, infections).     Do not wear jewelry, make-up, hairpins, clips or nail polish. Do not wear lotions, powders, or perfumes.  Do not shave 48 hours prior to surgery. Men may shave face and neck. Do not bring valuables to the hospital.    [facility] is not responsible for any belongings or valuables.  Contacts, dentures/partials or body piercings may not be worn into surgery. Bring a case for your contacts, glasses or hearing aids, a denture cup will be supplied. Leave your suitcase in the car. After surgery it may be brought to your room. For patients admitted to the hospital, discharge time is determined by your treatment team.   Patients discharged the day of surgery will not be allowed to drive home.   Please read over the  following fact sheets that you were given:    __X__ Take these medicines the morning of surgery with A SIP OF WATER:    1. citalopram (CELEXA) 20 MG tablet  2. levothyroxine (SYNTHROID) 75 MCG tablet  3. rosuvastatin (CRESTOR) 20 MG tablet  4.tamsulosin (FLOMAX) 0.4 MG CAPS capsule  5.  6.  ____ Fleet Enema (as directed)   ___ Use CHG Soap/SAGE wipes as directed  ____ Use inhalers on the day of surgery  __x__ Stop metformin/Janumet/Farxiga 2 days prior to surgery    ____ Take 1/2 of usual insulin dose the night before surgery. No insulin the morning          of surgery.   __X_ Stop Blood Thinners Coumadin/Plavix/Xarelto/Pleta/Pradaxa/Eliquis/Effient/Aspirin  on  Or contact your Surgeon, Cardiologist or Medical Doctor regarding  ability to stop your blood thinners  ( as instructed by neurologist)  __X__ Stop Anti-inflammatories 7 days before surgery such as Advil, Ibuprofen, Motrin,  BC or Goodies Powder, Naprosyn, Naproxen, Aleve,   __X__  Stop all herbal supplements, fish oil or vitamin E until after surgery for 7 days.  ____ Bring C-Pap to the hospital.

## 2020-12-16 ENCOUNTER — Telehealth: Payer: Self-pay | Admitting: Urology

## 2020-12-16 LAB — URINE CULTURE: Culture: NO GROWTH

## 2020-12-16 NOTE — Telephone Encounter (Signed)
Called Dr. Valora Piccolo back, his office closed at 430.  Left message on machine to return my message.  Left personal cell phone number.

## 2020-12-16 NOTE — Telephone Encounter (Signed)
Dr. Coralyn Pear from [facility] called and left a message requesting a return call to discuss patients clearance for Holep surgery on 12/25/20. Phone number is [phone]

## 2020-12-17 ENCOUNTER — Telehealth: Payer: Self-pay

## 2020-12-17 ENCOUNTER — Telehealth: Payer: Self-pay | Admitting: Urology

## 2020-12-17 ENCOUNTER — Other Ambulatory Visit
Admission: RE | Admit: 2020-12-17 | Discharge: 2020-12-17 | Disposition: A | Discharge status: Home / Self Care | Payer: Medicare Other | Source: Ambulatory Visit | Attending: Urology | Admitting: Urology

## 2020-12-17 ENCOUNTER — Ambulatory Visit: Payer: Medicare Other | Admitting: Internal Medicine

## 2020-12-17 ENCOUNTER — Other Ambulatory Visit: Payer: Self-pay

## 2020-12-17 DIAGNOSIS — Z01810 Encounter for preprocedural cardiovascular examination: Secondary | ICD-10-CM | POA: Insufficient documentation

## 2020-12-17 NOTE — Telephone Encounter (Signed)
Pt aware and verbalized understanding. Patient does not think he is able to do CIC. Advised pt we will call him once we hear back from neurosurgeon

## 2020-12-17 NOTE — Telephone Encounter (Signed)
I am awaiting callback from his neurosurgeon about whether or not we can stop his Plavix which is necessary for this upcoming procedure.  If he cannot safely be taken off the Plavix, his procedure will need to be postponed  Given his failed multiple voiding trials, its unlikely that he is going to be able to void spontaneously without surgery.  One option would be for him to come in and learn how to CIC.  If he is interested in this, he can be set up for this with Sam on a Friday.  Vanna Scotland, MD

## 2020-12-17 NOTE — Telephone Encounter (Signed)
Patient called the office today to schedule a follow up from his ER visit for urinary retention.  They placed a catheter and told him to call the office to schedule a voiding trial.   I noticed that he is scheduled for surgery.  Do I need to schedule a voiding trial?  Please advise.

## 2020-12-17 NOTE — Telephone Encounter (Signed)
Patient came into clinic complaining of bladder spasms and asking when he can get his catheter out.

## 2020-12-18 ENCOUNTER — Telehealth: Payer: Self-pay | Admitting: *Deleted

## 2020-12-18 NOTE — Telephone Encounter (Signed)
Dr. Valora Piccolo called to discuss patient's upcoming surgery [phone]

## 2020-12-19 ENCOUNTER — Encounter: Payer: Self-pay | Admitting: Urgent Care

## 2020-12-19 ENCOUNTER — Telehealth: Payer: Self-pay | Admitting: *Deleted

## 2020-12-19 DIAGNOSIS — R339 Retention of urine, unspecified: Secondary | ICD-10-CM

## 2020-12-19 NOTE — Telephone Encounter (Signed)
Patient called would like to clarify if it was coordinated with Neurology to discontinue Plavix

## 2020-12-19 NOTE — Telephone Encounter (Signed)
Per Dr. Apolinar Junes continue Plavix until finalized with Neurology-may have to delay surgery. Patient voiced understanding.

## 2020-12-22 ENCOUNTER — Other Ambulatory Visit: Payer: Self-pay

## 2020-12-22 ENCOUNTER — Ambulatory Visit: Payer: Medicare Other | Admitting: *Deleted

## 2020-12-22 DIAGNOSIS — R339 Retention of urine, unspecified: Secondary | ICD-10-CM

## 2020-12-22 MED ORDER — TAMSULOSIN HCL 0.4 MG PO CAPS: 0.8000 mg | ORAL_CAPSULE | Freq: Every day | ORAL | 3 refills | Status: DC

## 2020-12-22 NOTE — Progress Notes (Signed)
Patient came in today and states his leg bag is leaking. Changed  leg bag and give him a night bag too .

## 2020-12-22 NOTE — Telephone Encounter (Signed)
Left another voice mail on Dr. Arna Medici cell phone to return call  Vanna Scotland, MD

## 2020-12-22 NOTE — Telephone Encounter (Signed)
Patient called the office this morning to follow up on the status of his surgery (currently scheduled for 6/23).    Has anyone been able to reach patient's neurologist to coordinate his Plavix?  Patient is also inquiring about his catheter leaking. Please contact patient to discuss and advise.

## 2020-12-22 NOTE — Addendum Note (Signed)
Addended by: Martha Clan on: 12/22/2020 03:48 PM   Modules accepted: Orders

## 2020-12-22 NOTE — Telephone Encounter (Signed)
Spoke with patient and explained bladder spasms due to catheter in place. Went over possible solutions to help with spasms such as leg strap adjustment and slack in the line to minimize tugging and pulling. Patient should try and use pads/depends to help with leakage. Tamsulosin was refilled per his request since he is taking 2 times daily. It was explained that Dr. Apolinar Junes and Dr. Valora Piccolo are trying to get in contact with each other and both have each other's personal numbers. Once clearance is discussed a reschedule date will be found and patient will be contacted with an update

## 2020-12-24 NOTE — Telephone Encounter (Signed)
Per Dr. Apolinar Junes patient was contacted and explained that per Dr. Valora Piccolo patient should stay on Plavix for 50months to avoid increased risk of stroke. HOLEP cannot be done while on plavix so for now we will need to defer surgery until August. Patient should come to the office and learn CIC in the interm for retention management. Patient is in hesitant agreement with this plan. Scheduled for CIC teaching appointment with Sam on Friday @ 11:30. He will also see Tacey Ruiz that day to discuss rescheduling surgery to a date in august.

## 2020-12-25 ENCOUNTER — Ambulatory Visit: Admission: RE | Admit: 2020-12-25 | Payer: Medicare Other | Source: Home / Self Care | Admitting: Urology

## 2020-12-25 ENCOUNTER — Encounter: Admission: RE | Payer: Self-pay | Source: Home / Self Care

## 2020-12-25 SURGERY — ENUCLEATION, PROSTATE, USING LASER, WITH MORCELLATION
Anesthesia: General

## 2020-12-26 ENCOUNTER — Encounter: Payer: Self-pay | Admitting: Physician Assistant

## 2020-12-26 ENCOUNTER — Other Ambulatory Visit: Payer: Self-pay | Admitting: Urology

## 2020-12-26 ENCOUNTER — Ambulatory Visit: Payer: Medicare Other | Admitting: Physician Assistant

## 2020-12-26 ENCOUNTER — Other Ambulatory Visit: Payer: Self-pay

## 2020-12-26 VITALS — BP 105/69 | HR 76 | Ht 68.0 in | Wt 153.0 lb

## 2020-12-26 DIAGNOSIS — R338 Other retention of urine: Secondary | ICD-10-CM

## 2020-12-26 DIAGNOSIS — N401 Enlarged prostate with lower urinary tract symptoms: Secondary | ICD-10-CM | POA: Diagnosis not present

## 2020-12-26 NOTE — Progress Notes (Addendum)
Catheter Removal  Patient is present today for a catheter removal.  26m of water was drained from the balloon. A 16FR coude foley cath was removed from the bladder no complications were noted . Patient tolerated well.  Performed by: SDebroah Loop PA-C   Continuous Intermittent Catheterization  Due to BPH with urinary retention patient is present today for a teaching of self I & O Catheterization. Patient was given detailed verbal and printed instructions of self catheterization. Patient was cleaned and prepped in a clean fashion.  With instruction and assistance patient inserted a 14FR [employer] SpeediCath Flex Coude Pro and urine return was noted 50 ml, urine was yellow in color. Patient tolerated well, no complications were noted Patient was given a sample bag with supplies to take home.  Instructions were given for patient to cath 3+ times daily.  An order was placed with [employer] for catheters to be sent to the patient's home.  Performed by: SDebroah Loop PA-C   Additional Notes: Patient met with LDenny Peontoday to discuss surgical scheduling in August.

## 2020-12-26 NOTE — Patient Instructions (Signed)

## 2021-02-04 ENCOUNTER — Telehealth: Payer: Self-pay

## 2021-02-04 NOTE — Telephone Encounter (Signed)
Surgical Clearance for patient to stop Plavix 5-7 ok to continue ASA prior to surgery was sent to Dr. Coralyn Pear, Neurosurgery @[phone].  Awaiting response

## 2021-02-05 ENCOUNTER — Other Ambulatory Visit: Payer: Self-pay

## 2021-02-05 ENCOUNTER — Encounter: Payer: Self-pay | Admitting: Internal Medicine

## 2021-02-05 ENCOUNTER — Ambulatory Visit (INDEPENDENT_AMBULATORY_CARE_PROVIDER_SITE_OTHER): Payer: Medicare Other | Admitting: Internal Medicine

## 2021-02-05 VITALS — BP 136/86 | HR 72 | Temp 98.1°F | Ht 68.0 in | Wt 161.0 lb

## 2021-02-05 DIAGNOSIS — D539 Nutritional anemia, unspecified: Secondary | ICD-10-CM | POA: Diagnosis not present

## 2021-02-05 DIAGNOSIS — I10 Essential (primary) hypertension: Secondary | ICD-10-CM

## 2021-02-05 DIAGNOSIS — E876 Hypokalemia: Secondary | ICD-10-CM | POA: Diagnosis not present

## 2021-02-05 DIAGNOSIS — E785 Hyperlipidemia, unspecified: Secondary | ICD-10-CM | POA: Diagnosis not present

## 2021-02-05 DIAGNOSIS — Z0001 Encounter for general adult medical examination with abnormal findings: Secondary | ICD-10-CM

## 2021-02-05 DIAGNOSIS — E039 Hypothyroidism, unspecified: Secondary | ICD-10-CM | POA: Insufficient documentation

## 2021-02-05 DIAGNOSIS — E118 Type 2 diabetes mellitus with unspecified complications: Secondary | ICD-10-CM

## 2021-02-05 DIAGNOSIS — R7303 Prediabetes: Secondary | ICD-10-CM | POA: Insufficient documentation

## 2021-02-05 DIAGNOSIS — Z23 Encounter for immunization: Secondary | ICD-10-CM

## 2021-02-05 LAB — BASIC METABOLIC PANEL
BUN: 11 mg/dL (ref 6–23)
CO2: 32 mEq/L (ref 19–32)
Calcium: 9.3 mg/dL (ref 8.4–10.5)
Chloride: 100 mEq/L (ref 96–112)
Creatinine, Ser: 1.12 mg/dL (ref 0.40–1.50)
GFR: 65.59 mL/min (ref >60.00)
Glucose, Bld: 95 mg/dL (ref 70–99)
Potassium: 4.3 mEq/L (ref 3.5–5.1)
Sodium: 138 mEq/L (ref 135–145)

## 2021-02-05 LAB — CBC WITH DIFFERENTIAL/PLATELET
Basophils Absolute: 0 10*3/uL (ref 0.0–0.1)
Basophils Relative: 0.3 % (ref 0.0–3.0)
Eosinophils Absolute: 0.1 10*3/uL (ref 0.0–0.7)
Eosinophils Relative: 1.7 % (ref 0.0–5.0)
HCT: 42.4 % (ref 39.0–52.0)
Hemoglobin: 14 g/dL (ref 13.0–17.0)
Lymphocytes Relative: 18.8 % (ref 12.0–46.0)
Lymphs Abs: 1.3 10*3/uL (ref 0.7–4.0)
MCHC: 33.1 g/dL (ref 30.0–36.0)
MCV: 89.1 fl (ref 78.0–100.0)
Monocytes Absolute: 0.4 10*3/uL (ref 0.1–1.0)
Monocytes Relative: 6.2 % (ref 3.0–12.0)
Neutro Abs: 4.8 10*3/uL (ref 1.4–7.7)
Neutrophils Relative %: 73 % (ref 43.0–77.0)
Platelets: 161 10*3/uL (ref 150.0–400.0)
RBC: 4.76 Mil/uL (ref 4.22–5.81)
RDW: 15 % (ref 11.5–15.5)
WBC: 6.6 10*3/uL (ref 4.0–10.5)

## 2021-02-05 LAB — HEPATIC FUNCTION PANEL
ALT: 16 U/L (ref 0–53)
AST: 18 U/L (ref 0–37)
Albumin: 4.4 g/dL (ref 3.5–5.2)
Alkaline Phosphatase: 37 U/L — ABNORMAL LOW (ref 39–117)
Bilirubin, Direct: 0.1 mg/dL (ref 0.0–0.3)
Total Bilirubin: 0.8 mg/dL (ref 0.2–1.2)
Total Protein: 7.6 g/dL (ref 6.0–8.3)

## 2021-02-05 LAB — VITAMIN B12: Vitamin B-12: 441 pg/mL (ref 211–911)

## 2021-02-05 LAB — IRON: Iron: 134 ug/dL (ref 42–165)

## 2021-02-05 LAB — LIPID PANEL
Cholesterol: 228 mg/dL — ABNORMAL HIGH (ref 0–200)
HDL: 65 mg/dL (ref >39.00)
LDL Cholesterol: 132 mg/dL — ABNORMAL HIGH (ref 0–99)
NonHDL: 162.64
Total CHOL/HDL Ratio: 4
Triglycerides: 155 mg/dL — ABNORMAL HIGH (ref 0.0–149.0)
VLDL: 31 mg/dL (ref 0.0–40.0)

## 2021-02-05 LAB — FERRITIN: Ferritin: 200.2 ng/mL (ref 22.0–322.0)

## 2021-02-05 LAB — FOLATE: Folate: 21 ng/mL (ref >5.9)

## 2021-02-05 LAB — TSH: TSH: 2.53 u[IU]/mL (ref 0.35–5.50)

## 2021-02-05 LAB — MAGNESIUM: Magnesium: 2.2 mg/dL (ref 1.5–2.5)

## 2021-02-05 LAB — HEMOGLOBIN A1C: Hgb A1c MFr Bld: 5.9 % (ref 4.6–6.5)

## 2021-02-05 MED ORDER — LEVOTHYROXINE SODIUM 75 MCG PO TABS: 75.0000 ug | ORAL_TABLET | Freq: Every day | ORAL | 1 refills | Status: DC

## 2021-02-05 MED ORDER — ROSUVASTATIN CALCIUM 20 MG PO TABS: 20.0000 mg | ORAL_TABLET | Freq: Every day | ORAL | 1 refills | Status: DC

## 2021-02-05 NOTE — Progress Notes (Signed)
Subjective:  Patient ID: Christopher Arroyo, male    DOB: January 18, 1946  Age: 75 y.o. MRN: [id number]  CC: Annual Exam, Hypertension, Diabetes, Hyperlipidemia, Hypothyroidism, and Anemia  This visit occurred during the SARS-CoV-2 public health emergency.  Safety protocols were in place, including screening questions prior to the visit, additional usage of staff PPE, and extensive cleaning of exam room while observing appropriate contact time as indicated for disinfecting solutions.    HPI Winslow Ederer presents for a CPX and to establish.  He tells me that he has been evaluated recently by an orthopedic surgeon for shoulder and back pain.  He walks about 1.5 miles a day.  He does not experience CP, DOE, diaphoresis, dizziness, or lightheadedness.  He tells me that he is scheduled to have prostate surgery to treat BPH.  History Teresa has a past medical history of Depression, History of hiatal hernia, Hypertension, Pre-diabetes, Thyroid disease, and TIA (transient ischemic attack) (2009).   He has a past surgical history that includes Tonsillectomy.   His family history is not on file.He reports that he quit smoking about 38 years ago. His smoking use included cigarettes. He has never used smokeless tobacco. He reports previous alcohol use. He reports previous drug use. Drug: Heroin.  Outpatient Medications Prior to Visit  Medication Sig Dispense Refill   aspirin EC 81 MG tablet Take 81 mg by mouth daily. Swallow whole.     b complex vitamins capsule Take 1 capsule by mouth daily.     citalopram (CELEXA) 20 MG tablet Take 20 mg by mouth daily.     clopidogrel (PLAVIX) 75 MG tablet Take 75 mg by mouth daily.     dorzolamide-timolol (COSOPT) 22.3-6.8 MG/ML ophthalmic solution Place 1 drop into both eyes 2 (two) times daily.     lisinopril-hydrochlorothiazide (ZESTORETIC) 20-25 MG tablet Take 1 tablet by mouth daily.     tamsulosin (FLOMAX) 0.4 MG CAPS capsule Take 2 capsules (0.8 mg total) by mouth  daily. 60 capsule 3   traZODone (DESYREL) 50 MG tablet Take 50 mg by mouth at bedtime.     levothyroxine (SYNTHROID) 75 MCG tablet Take 75 mcg by mouth daily before breakfast.     metFORMIN (GLUCOPHAGE) 500 MG tablet Take 500 mg by mouth daily with breakfast.     nitrofurantoin, macrocrystal-monohydrate, (MACROBID) 100 MG capsule Take 1 tablet twice a day for 10 days-start 7 days prior to surgery 20 capsule 0   rosuvastatin (CRESTOR) 20 MG tablet Take 20 mg by mouth daily.     No facility-administered medications prior to visit.    ROS Review of Systems  Constitutional:  Negative for appetite change, chills, diaphoresis, fatigue, fever and unexpected weight change.  HENT: Negative.    Eyes: Negative.  Negative for visual disturbance.  Respiratory:  Negative for cough, chest tightness, shortness of breath and wheezing.   Cardiovascular:  Negative for chest pain, palpitations and leg swelling.  Gastrointestinal:  Negative for abdominal pain, blood in stool, diarrhea, nausea and vomiting.  Endocrine: Negative.   Genitourinary: Negative.  Negative for difficulty urinating.  Musculoskeletal:  Positive for arthralgias, back pain and gait problem. Negative for myalgias.  Skin: Negative.  Negative for pallor.  Neurological:  Positive for numbness. Negative for dizziness, weakness, light-headedness and headaches.  Hematological:  Negative for adenopathy. Does not bruise/bleed easily.  Psychiatric/Behavioral:  Positive for confusion and decreased concentration. Negative for sleep disturbance and suicidal ideas. The patient is not nervous/anxious.    Objective:  BP 136/86 (BP  Location: Left Arm, Patient Position: Sitting, Cuff Size: Large)   Pulse 72   Temp 98.1 F (36.7 C) (Oral)   Ht 5\' 8"  (1.727 m)   Wt 161 lb (73 kg)   SpO2 99%   BMI 24.48 kg/m   Physical Exam Vitals reviewed.  HENT:     Nose: Nose normal.     Mouth/Throat:     Mouth: Mucous membranes are moist.  Eyes:      Conjunctiva/sclera: Conjunctivae normal.  Cardiovascular:     Rate and Rhythm: Normal rate and regular rhythm.     Heart sounds: Normal heart sounds, S1 normal and S2 normal.     Comments: EKG- NSR, 64 bpm Normal EKG Pulmonary:     Effort: Pulmonary effort is normal.     Breath sounds: No stridor. No wheezing, rhonchi or rales.  Abdominal:     General: Abdomen is flat. There is no distension.     Palpations: There is no mass.     Tenderness: There is no abdominal tenderness. There is no guarding.     Hernia: No hernia is present.  Musculoskeletal:        General: Normal range of motion.     Cervical back: Neck supple.     Right lower leg: No edema.     Left lower leg: No edema.  Skin:    General: Skin is warm and dry.     Findings: No rash.  Neurological:     Mental Status: He is alert. Mental status is at baseline.     Cranial Nerves: No dysarthria or facial asymmetry.     Motor: Motor function is intact.     Coordination: Coordination abnormal.     Gait: Gait abnormal.  Psychiatric:        Attention and Perception: He is inattentive.        Mood and Affect: Mood normal.        Speech: Speech is tangential.        Behavior: Behavior is slowed. Behavior is not withdrawn.        Thought Content: Thought content normal.        Cognition and Memory: Cognition is impaired.    Lab Results  Component Value Date   WBC 6.6 02/05/2021   HGB 14.0 02/05/2021   HCT 42.4 02/05/2021   PLT 161.0 02/05/2021   GLUCOSE 95 02/05/2021   CHOL 228 (H) 02/05/2021   TRIG 155.0 (H) 02/05/2021   HDL 65.00 02/05/2021   LDLCALC 132 (H) 02/05/2021   ALT 16 02/05/2021   AST 18 02/05/2021   NA 138 02/05/2021   K 4.3 02/05/2021   CL 100 02/05/2021   CREATININE 1.12 02/05/2021   BUN 11 02/05/2021   CO2 32 02/05/2021   TSH 2.53 02/05/2021   PSA 4.39 07/02/2019   HGBA1C 5.9 02/05/2021    Magnetic resonance angiography, head without contrast material.   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 contrast.   MRA Head- Time of flight MRA of the intracranial circulation was performed without I.V. contrast as a separate acquisition.   FINDINGS:   MRI brain findings-   Extensive encephalomalacia and gliosis is again seen throughout  the right MCA territory prior MCA infarct. Remote infarcts are also again seen in the right cerebellar hemisphere, left frontal lobe, and bilateral occipital lobes.   Confluent FLAIR signal abnormality throughout the remaining supratentorial white matter likely reflect advanced chronic white matter microangiopathy. The ventricles are stable in size. There is no evidence of acute intracranial hemorrhage or infarct. Cortically based curvilinear SWI signal likely reflects hemosiderin deposition.   No mass. 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   There is a left petrous apex effusion, unchanged.   MRA head findings-   The MRA sequences were acquired with a separate acquisition using a pulse sequence designed to provide unique information about the blood flowing in the lumen of the intracranial vessels. This information is complimentary to, and different from the information contained in routine brain imaging sequences.   There is mild loss of signal in the right cavernous ICA related to the pipeline stent. The previously seen right cavernous ICA aneurysm is no longer seen on the time-of-flight MRA sequence following interval pipeline embolization. There is ill-defined enhancement of the excluded aneurysm on the postcontrast sequences. No new aneurysm is identified. There is no hemodynamicallysignificant  stenosis or occlusion.  Procedure Note  Lin LandsmanZamora Gonzalez, Karie Fetcharlos Armando, MD - 11/22/2020  Formatting of this note might be different from the original.  EXAM:  Magnetic resonance imaging, brain without and with contrast material.   Magnetic resonance angiography, head without contrast material.   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 contrast.   MRA Head- Time of flight MRA of the intracranial circulation was performed without I.V. contrast as a separate acquisition.   FINDINGS:   MRI brain findings-   Extensive encephalomalacia and gliosis is again seen throughout the right MCA territory prior MCA infarct. Remote infarcts are also again seen in the right cerebellar hemisphere, left frontal lobe, and bilateral occipital lobes.   Confluent FLAIR signal abnormality throughout the remaining supratentorial white matter likely reflect advanced chronic white matter microangiopathy. The ventricles are stable in size. There is no evidence of acute intracranial hemorrhage or infarct. Cortically based curvilinear SWI signal likely reflects hemosiderin deposition.   No mass. 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   There is a left petrous apex effusion, unchanged.   MRA head findings-   The MRA sequences were acquired with a separate acquisition using a pulse sequence designed to provide unique information about the blood flowing in the lumen of the intracranial vessels. This information is complimentary to, and different from the information contained in routine brain imaging sequences.   There is mild loss of signal in the right cavernous ICA  related to the pipeline stent. The previously seen right cavernous ICA aneurysm is no longer seen on the time-of-flight MRA sequence following interval pipeline embolization. There is ill-defined enhancement of the excluded aneurysm on the postcontrast sequences. No new aneurysm is identified. There is no hemodynamically significant stenosis or occlusion.   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 Postcontrast enhancement of the excluded aneurysm may reflect thrombus or granulation tissue.   Stable sequela of  multiple remote infarcts described above. Exam End: 11/22/20 13:51   Specimen Collected: 11/22/20 13:51 Last Resulted: 11/22/20 14:23      Assessment & Plan:   Dhani was seen today for annual exam, hypertension, diabetes, hyperlipidemia, hypothyroidism and anemia.  Diagnoses and all orders for this visit:  Encounter for general adult medical examination with abnormal findings- Exam completed, labs reviewed, vaccines reviewed and updated, cancer screenings are up-to-date, patient education was given.  Hypokalemia- His K+ is normal now. -     Basic metabolic panel; Future -     Magnesium; Future -     Magnesium -     Basic metabolic panel  Primary hypertension- His BP is well controlled. -     Urinalysis, Routine w reflex microscopic; Future -     EKG 12-Lead -     Urinalysis, Routine w reflex microscopic  Type II diabetes mellitus with manifestations (HCC)- His A1C is down to 5.9%. Will d'c metformin. -     Hemoglobin A1c; Future -     Microalbumin / creatinine urine ratio; Future -     HM Diabetes Foot Exam -     Ambulatory referral to Ophthalmology -     Microalbumin / creatinine urine ratio -     Hemoglobin A1c  Acquired hypothyroidism- His TSH is in the normal range.  He will stay on the current dose of levothyroxine. -     TSH; Future -     TSH  Hyperlipidemia with target LDL less  than 70- I recommend that he take a statin for CV risk reduction. -     Lipid panel; Future -     Hepatic function panel; Future -     Hepatic function panel -     Lipid panel -     levothyroxine (SYNTHROID) 75 MCG tablet; Take 1 tablet (75 mcg total) by mouth daily before breakfast. -     rosuvastatin (CRESTOR) 20 MG tablet; Take 1 tablet (20 mg total) by mouth daily.  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  I will screen for vitamin deficiencies. -     CBC with Differential/Platelet; Future -     Vitamin B12; Future -     Iron; Future -     Vitamin B1; Future -     Folate; Future -     Ferritin; Future -     Ferritin -     Folate -     Vitamin B1 -     Iron -     Vitamin B12 -     CBC with Differential/Platelet  I have discontinued Malikhi Ponciano's nitrofurantoin (macrocrystal-monohydrate) and metFORMIN. I have also changed his rosuvastatin. Additionally, I am having him start on Shingrix. Lastly, I am having him maintain his citalopram, clopidogrel, lisinopril-hydrochlorothiazide, traZODone, aspirin EC, b complex vitamins, dorzolamide-timolol, tamsulosin, and levothyroxine.  Meds ordered this encounter  Medications   DISCONTD: levothyroxine (SYNTHROID) 75 MCG tablet    Sig: Take 1 tablet (75 mcg total) by mouth daily before breakfast.    Dispense:  90 tablet    Refill:  1   rosuvastatin (CRESTOR) 20 MG tablet    Sig: Take 1 tablet (20 mg total) by mouth daily.    Dispense:  90 tablet    Refill:  1   Zoster Vaccine Adjuvanted [facility]) injection    Sig: Inject 0.5 mLs into the muscle once for 1 dose.    Dispense:  0.5 mL    Refill:  1  levothyroxine (SYNTHROID) 75 MCG tablet    Sig: Take 1 tablet (75 mcg total) by mouth daily before breakfast.    Dispense:  90 tablet    Refill:  1      Follow-up: Return in about 3 months (around 05/08/2021).  Sanda Linger, MD

## 2021-02-05 NOTE — Patient Instructions (Signed)
[employer] medicine (25th ed., pp. 1059-1068). [place], [place]: Elsevier.">  Anemia  Anemia is a condition in which there is not enough red blood cells or hemoglobin in the blood. Hemoglobin is a substance in red blood cells thatcarries oxygen. When you do not have enough red blood cells or hemoglobin (are anemic), your body cannot get enough oxygen and your organs may not work properly. Asa result, you may feel very tired or have other problems. What are the causes? Common causes of anemia include: Excessive bleeding. Anemia can be caused by excessive bleeding inside or outside the body, including bleeding from the intestines or from heavy menstrual periods in females. Poor nutrition. Long-lasting (chronic) kidney, thyroid, and liver disease. Bone marrow disorders, spleen problems, and blood disorders. Cancer and treatments for cancer. HIV (human immunodeficiency virus) and AIDS (acquired immunodeficiency syndrome). Infections, medicines, and autoimmune disorders that destroy red blood cells. What are the signs or symptoms? Symptoms of this condition include: Minor weakness. Dizziness. Headache, or difficulties concentrating and sleeping. Heartbeats that feel irregular or faster than normal (palpitations). Shortness of breath, especially with exercise. Pale skin, lips, and nails, or cold hands and feet. Indigestion and nausea. Symptoms may occur suddenly or develop slowly. If your anemia is mild, you maynot have symptoms. How is this diagnosed? This condition is diagnosed based on blood tests, your medical history, and a physical exam. In some cases, a test may be needed in which cells are removed from the soft tissue inside of a bone and looked at under a microscope (bone marrow biopsy). Your health care provider may also check your stool (feces) for blood and may do additional testing to look for the cause of yourbleeding. Other tests may include: Imaging tests, such as a CT scan or  MRI. A procedure to see inside your esophagus and stomach (endoscopy). A procedure to see inside your colon and rectum (colonoscopy). How is this treated? Treatment for this condition depends on the cause. If you continue to lose a lot of blood, you may need to be treated at a hospital. Treatment may include: Taking supplements of iron, vitamin B12, or folic acid. Taking a hormone medicine (erythropoietin) that can help to stimulate red blood cell growth. Having a blood transfusion. This may be needed if you lose a lot of blood. Making changes to your diet. Having surgery to remove your spleen. Follow these instructions at home: Take over-the-counter and prescription medicines only as told by your health care provider. Take supplements only as told by your health care provider. Follow any diet instructions that you were given by your health care provider. Keep all follow-up visits as told by your health care provider. This is important. Contact a health care provider if: You develop new bleeding anywhere in the body. Get help right away if: You are very weak. You are short of breath. You have pain in your abdomen or chest. You are dizzy or feel faint. You have trouble concentrating. You have bloody stools, black stools, or tarry stools. You vomit repeatedly or you vomit up blood. These symptoms may represent a serious problem that is an emergency. Do not wait to see if the symptoms will go away. Get medical help right away. Call your local emergency services (911 in the [place] Do not drive yourself to the hospital. Summary Anemia is a condition in which you do not have enough red blood cells or enough of a substance in your red blood cells that carries oxygen (hemoglobin). Symptoms may occur suddenly   or develop slowly. If your anemia is mild, you may not have symptoms. This condition is diagnosed with blood tests, a medical history, and a physical exam. Other tests may be  needed. Treatment for this condition depends on the cause of the anemia. This information is not intended to replace advice given to you by your health care provider. Make sure you discuss any questions you have with your healthcare provider. Document Revised: 05/29/2019 Document Reviewed: 05/29/2019 Elsevier Patient Education  2022 [employer].

## 2021-02-06 MED ORDER — LEVOTHYROXINE SODIUM 75 MCG PO TABS: 75.0000 ug | ORAL_TABLET | Freq: Every day | ORAL | 1 refills | Status: DC

## 2021-02-06 MED ORDER — SHINGRIX 50 MCG/0.5ML IM SUSR: 0.5000 mL | Freq: Once | INTRAMUSCULAR | 1 refills | Status: AC

## 2021-02-06 NOTE — Telephone Encounter (Signed)
Called Dr. Merdis Delay office to check on status of clearance [phone]), left a message for a return call. Patient called office stating that he was informed from Dr. Merdis Delay office that he will require an Angiogram prior which was scheduled for 02/19/21. Would like to see if this  can be moved up so as to avoid rescheduling surgery again per patient's request.

## 2021-02-11 ENCOUNTER — Other Ambulatory Visit: Payer: Self-pay | Admitting: Internal Medicine

## 2021-02-11 ENCOUNTER — Encounter: Payer: Self-pay | Admitting: Internal Medicine

## 2021-02-11 LAB — VITAMIN B1: Vitamin B1 (Thiamine): 13 nmol/L (ref 8–30)

## 2021-02-13 ENCOUNTER — Other Ambulatory Visit: Payer: Self-pay | Admitting: Internal Medicine

## 2021-02-16 ENCOUNTER — Other Ambulatory Visit: Admission: RE | Admit: 2021-02-16 | Payer: Medicare Other | Source: Ambulatory Visit

## 2021-02-16 HISTORY — DX: Cerebral aneurysm, nonruptured: I67.1

## 2021-02-17 ENCOUNTER — Telehealth: Payer: Self-pay | Admitting: Urology

## 2021-02-17 NOTE — Telephone Encounter (Signed)
I called Dr.Yapp's office yesterday 02/16/21 to check on clearance and spoke to [place]. She told me she would send a message to Dr.Yapp and would try to call me back by the end of the day. I told her if I was out of office please leave a detailed message on my direct line and gave her my number. I came in today and had no message so I called Dr. Merdis Delay office and spoke to [place] who checked to see if there was a response from Dr. Coralyn Pear and she said "you know what I don't even think he is in office today let me check" She then told me Dr. Coralyn Pear is on vacation and not scheduled to return until 02/20/21 which is the date of Christopher Arroyo's Angiogram. Wilburn Mylar then read notes from Christopher Arroyo's last visit with Dr. Coralyn Pear that stated pt is to continue his aspirin and Plavix until his Angiogram.

## 2021-02-18 ENCOUNTER — Other Ambulatory Visit: Payer: Self-pay

## 2021-02-18 ENCOUNTER — Encounter
Admission: RE | Admit: 2021-02-18 | Discharge: 2021-02-18 | Disposition: A | Discharge status: Home / Self Care | Payer: Medicare Other | Source: Ambulatory Visit | Attending: Urology | Admitting: Urology

## 2021-02-18 HISTORY — DX: Hypothyroidism, unspecified: E03.9

## 2021-02-18 HISTORY — DX: Type 2 diabetes mellitus without complications: E11.9

## 2021-02-18 NOTE — Patient Instructions (Addendum)
Your procedure is scheduled on: Monday, August 22 Report to the Registration Desk on the 1st floor of the [facility] To find out your arrival time, please call [phone] between 1PM - 3PM on: Friday, August 19  REMEMBER: Instructions that are not followed completely may result in serious medical risk, up to and including death; or upon the discretion of your surgeon and anesthesiologist your surgery may need to be rescheduled.  Do not eat or drink after midnight the night before surgery.  No gum chewing, lozengers or hard candies.  TAKE THESE MEDICATIONS THE MORNING OF SURGERY WITH A SIP OF WATER:  Citalopram (Celexa) Cosopt eye drops Levothyroxine Rosuvastatin (Crestor) Tamsulosin (Flomax)  Stop Metformin 2 days prior to surgery. Last day to take metformin is Friday, August 19. Resume AFTER surgery.  Per neurologist, stop Plavix today, August 17. Resume AFTER surgery per surgeons instruction.  One week prior to surgery: starting today, August 17 Stop Anti-inflammatories (NSAIDS) such as Advil, Aleve, Ibuprofen, Motrin, Naproxen, Naprosyn and Aspirin based products such as Excedrin, Goodys Powder, BC Powder. Stop ANY OVER THE COUNTER supplements until after surgery. Stop multiple vitamin You may however, continue to take Tylenol if needed for pain up until the day of surgery.  No Alcohol for 24 hours before or after surgery.  No Smoking including e-cigarettes for 24 hours prior to surgery.  No chewable tobacco products for at least 6 hours prior to surgery.  No nicotine patches on the day of surgery.  Do not use any "recreational" drugs for at least a week prior to your surgery.  Please be advised that the combination of cocaine and anesthesia may have negative outcomes, up to and including death. If you test positive for cocaine, your surgery will be cancelled.  On the morning of surgery brush your teeth with toothpaste and water, you may rinse your mouth with mouthwash  if you wish. Do not swallow any toothpaste or mouthwash.  Do not wear jewelry.  Do not wear lotions, powders, or perfumes.   Do not shave body from the neck down 48 hours prior to surgery just in case you cut yourself which could leave a site for infection.   Contact lenses, hearing aids and dentures may not be worn into surgery.  Do not bring valuables to the hospital. [facility] is not responsible for any missing/lost belongings or valuables.   Notify your doctor if there is any change in your medical condition (cold, fever, infection).  Wear comfortable clothing (specific to your surgery type) to the hospital.  After surgery, you can help prevent lung complications by doing breathing exercises.  Take deep breaths and cough every 1-2 hours. Your doctor may order a device called an Incentive Spirometer to help you take deep breaths.  If you are being discharged the day of surgery, you will not be allowed to drive home. You will need a responsible adult (18 years or older) to drive you home and stay with you that night.   If you are taking public transportation, you will need to have a responsible adult (18 years or older) with you. Please confirm with your physician that it is acceptable to use public transportation.   Please call the Pre-admissions Testing Dept. at [phone] if you have any questions about these instructions.  Surgery Visitation Policy:  Patients undergoing a surgery or procedure may have one family member or support person with them as long as that person is not COVID-19 positive or experiencing its  symptoms.  That person may remain in the waiting area during the procedure.

## 2021-02-18 NOTE — Telephone Encounter (Signed)
Christopher Arroyo with Dr. Merdis Delay office called at 11:20am to tell me Dr. Coralyn Pear sent her a message stating it is o.k for pt. To stop his Plavix today so he may have his Surgery with Dr. Apolinar Junes on Monday 02/23/21. I ask her to make sure and call the pt. To let him know. I also called the pt. To let him know.

## 2021-02-20 ENCOUNTER — Encounter: Payer: Self-pay | Admitting: Urgent Care

## 2021-02-20 ENCOUNTER — Telehealth: Payer: Self-pay | Admitting: Urgent Care

## 2021-02-20 ENCOUNTER — Encounter: Payer: Self-pay | Admitting: Urology

## 2021-02-20 HISTORY — PX: CAROTID ANGIOGRAM: SHX5765

## 2021-02-20 NOTE — Progress Notes (Signed)
Perioperative Services Pre-Admission/Anesthesia Testing    Date: 02/20/21  Name: Christopher Arroyo MRN:   [id number]  Re: Presurgical clearance  Planned Surgical Procedure(s):    Clinical Notes:  Patient is scheduled for the above procedure on 02/23/2021 with Dr. Vanna Scotland, MD.  Patient with known RIGHT clinoidal ICA aneurysm. He is s/p placement of a 4.75 x 14 mm RIGHT pipeline embolization device (PED) on 08/08/2020.  This procedure necessitated the use of daily DAPT therapy (ASA + clopidogrel) x 3-4 months postoperatively.  Patient has been taking these medications as prescribed with no evidence of GI bleeding.  Of note, urological procedure initially scheduled for 12/25/2020, however following conversation between Dr. Apolinar Junes and Dr. Valora Piccolo, the decision was made to postpone allowing for additional time on DAPT therapy and allowing for repeat imaging prior to patient receiving Arroyo anesthesia.  Patient scheduled for cerebral angiogram on 02/20/2021.  Multiple attempts have been made to reach out to neurosurgery regarding patient's upcoming urological procedure. [facility] Urology staff has spoken to nurse at [facility] spine and neurosurgery clinic regarding need for clearance to hold DAPT therapy and proceed with planned surgical intervention.  Signed clearance form has been requested, however not received.  In review of the notes from the patient surgeons office, it is noted that Dr. Arna Medici nurse Gayleen Orem, RN) called and gave verbal clearance for patient to stop his clopidogrel on 02/18/2021 and proceed with planned surgical intervention on 02/23/2021.  Ideally, we would like to have signed surgical clearance on the chart for review by the surgical/anesthetic team, therefore  clearance form was generated by PAT APP and faxed to Dr. Arna Medici office on 02/18/2021 and again on 02/20/2021. Of note, paper clearance form also sent by primary attending surgeon's office.  As of 1530 PM today, I have not received  any communication from neurosurgery office.  Care everywhere indicates that patient is currently at [facility] awaiting to undergo his cerebral angiogram. Perhaps the signed clearance form will come following completion of the angiogram later today.   Pertinent Diagnostics:  IR EMBOLIZATION INTRACRANIAL SPINAL performed on 08/08/2020 Successful placement of a pipeline embolization device 4.75 x 14 mm for treatment of a right ICA cavernous segment aneurysm.  IR CEREBRAL ARTERIOGRAM performed on 04/24/2020 9 x 8 x 9 mm aneurysm within the cavernous segment of the right internal carotid artery.  Small bleb near origin of the right ophthalmic artery, best seen on 3D reconstructions.  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  Old infarct is seen in the right cerebellar hemisphere measuring 2.3 cm x 1 cm.  Old infarct is noted in the left medial occipital lobe measuring 1.9 cm x 1.3 cm.  Old infarct is seen in the superior aspect left frontal lobe measuring 1.8 cm x 1.4 cm.  Areas of increased signal intensity are noted centrum semiovale of the superior aspect of each cerebral hemisphere consistent with microvascular ischemic change and likely old lacunar infarcts. No extra-axial fluid collections are noted. Cerebral ventricles are normal. No acute infarcts are detected.  Near the right anterior clinoid process, there appears to be a focal outpouching arising from the paraclinoid right internal carotid artery measuring 7 x 7 mm  Impression and Plan:  Christopher Arroyo has been referred for pre-anesthesia review and clearance prior to him undergoing the planned anesthetic and procedural courses.  Patient has a known clinoidal ICA aneurysm that is status post RIGHT pipeline embolization. Patient having repeat embolization. Signed  clearance form has been requested from neurosurgery by PAT and [facility] Urology staff, however to date, nothing has been received. There is clinical documentation in Epic ([facility]) that suggests that clearance has been issued. Patient has been holding his clopidogrel since 02/18/2021.  With the above in mind, barring any significant acute changes in the patient's overall condition, it is anticipated that he will be able to proceed with the planned surgical intervention. Any acute changes in clinical condition may necessitate his procedure being postponed and/or cancelled. Patient will meet with anesthesia team (MD and/or CRNA) on this day of his procedure for preoperative evaluation/assessment.   Pre-surgical instructions were reviewed with the patient during his PAT appointment and questions were fielded by PAT clinical staff. Patient was advised that if any questions or concerns arise prior to his procedure then he should return a call to PAT and/or his surgeon's office to discuss.  Quentin Mulling, MSN, APRN, FNP-C, CEN [facility]  Peri-operative Services Nurse Practitioner Phone: [phone] 02/20/21 3:30 PM  NOTE: This note has been prepared using Dragon dictation software. Despite my best ability to proofread, there is always the potential that unintentional transcriptional errors may still occur from this process.

## 2021-02-23 ENCOUNTER — Ambulatory Visit
Admission: RE | Admit: 2021-02-23 | Discharge: 2021-02-23 | Disposition: A | Discharge status: Home / Self Care | Payer: Medicare Other | Attending: Urology | Admitting: Urology

## 2021-02-23 ENCOUNTER — Encounter
Admission: RE | Disposition: A | Discharge status: Home / Self Care | Payer: Self-pay | Source: Home / Self Care | Attending: Urology

## 2021-02-23 ENCOUNTER — Ambulatory Visit: Payer: Medicare Other | Admitting: Urgent Care

## 2021-02-23 ENCOUNTER — Encounter: Payer: Self-pay | Admitting: Urology

## 2021-02-23 ENCOUNTER — Other Ambulatory Visit: Payer: Self-pay

## 2021-02-23 DIAGNOSIS — N320 Bladder-neck obstruction: Secondary | ICD-10-CM | POA: Diagnosis not present

## 2021-02-23 DIAGNOSIS — Z7901 Long term (current) use of anticoagulants: Secondary | ICD-10-CM | POA: Diagnosis not present

## 2021-02-23 DIAGNOSIS — E119 Type 2 diabetes mellitus without complications: Secondary | ICD-10-CM | POA: Diagnosis not present

## 2021-02-23 DIAGNOSIS — N138 Other obstructive and reflux uropathy: Secondary | ICD-10-CM

## 2021-02-23 DIAGNOSIS — R338 Other retention of urine: Secondary | ICD-10-CM | POA: Diagnosis not present

## 2021-02-23 DIAGNOSIS — N401 Enlarged prostate with lower urinary tract symptoms: Secondary | ICD-10-CM | POA: Diagnosis present

## 2021-02-23 DIAGNOSIS — R339 Retention of urine, unspecified: Secondary | ICD-10-CM

## 2021-02-23 HISTORY — DX: Cerebral aneurysm, nonruptured: I67.1

## 2021-02-23 LAB — GLUCOSE, CAPILLARY
Glucose-Capillary: 126 mg/dL — ABNORMAL HIGH (ref 70–99)
Glucose-Capillary: 128 mg/dL — ABNORMAL HIGH (ref 70–99)

## 2021-02-23 SURGERY — Holmium Laser Enucleation of the Prostate with Morcellation
Anesthesia: General | Site: Prostate

## 2021-02-23 MED ORDER — MEPERIDINE HCL 25 MG/ML IJ SOLN: 6.2500 mg | INTRAMUSCULAR | Status: DC | PRN

## 2021-02-23 MED ORDER — FAMOTIDINE 20 MG PO TABS: 20.0000 mg | ORAL_TABLET | Freq: Once | ORAL | Status: AC

## 2021-02-23 MED ORDER — SODIUM CHLORIDE 0.9 % IR SOLN
Status: DC | PRN
  Administered 2021-02-23 (×5): 3000 mL

## 2021-02-23 MED ORDER — HYDROCODONE-ACETAMINOPHEN 5-325 MG PO TABS
1.0000 | ORAL_TABLET | Freq: Once | ORAL | Status: AC
  Administered 2021-02-23: 1 via ORAL

## 2021-02-23 MED ORDER — PROPOFOL 10 MG/ML IV BOLUS
INTRAVENOUS | Status: AC
  Filled 2021-02-23: qty 20

## 2021-02-23 MED ORDER — ONDANSETRON HCL 4 MG/2ML IJ SOLN: 4.0000 mg | Freq: Once | INTRAMUSCULAR | Status: DC | PRN

## 2021-02-23 MED ORDER — LIDOCAINE HCL (CARDIAC) PF 100 MG/5ML IV SOSY
PREFILLED_SYRINGE | INTRAVENOUS | Status: DC | PRN
  Administered 2021-02-23: 100 mg via INTRAVENOUS

## 2021-02-23 MED ORDER — OXYBUTYNIN CHLORIDE 5 MG PO TABS: 5.0000 mg | ORAL_TABLET | Freq: Three times a day (TID) | ORAL | 0 refills | Status: DC | PRN

## 2021-02-23 MED ORDER — SODIUM CHLORIDE 0.9 % IV SOLN: INTRAVENOUS | Status: DC

## 2021-02-23 MED ORDER — SUGAMMADEX SODIUM 200 MG/2ML IV SOLN
INTRAVENOUS | Status: DC | PRN
  Administered 2021-02-23: 200 mg via INTRAVENOUS

## 2021-02-23 MED ORDER — CHLORHEXIDINE GLUCONATE 0.12 % MT SOLN
OROMUCOSAL | Status: AC
  Administered 2021-02-23: 15 mL via OROMUCOSAL
  Filled 2021-02-23: qty 15

## 2021-02-23 MED ORDER — FENTANYL CITRATE (PF) 100 MCG/2ML IJ SOLN
INTRAMUSCULAR | Status: DC | PRN
  Administered 2021-02-23: 50 ug via INTRAVENOUS

## 2021-02-23 MED ORDER — PROPOFOL 10 MG/ML IV BOLUS
INTRAVENOUS | Status: DC | PRN
  Administered 2021-02-23: 150 mg via INTRAVENOUS

## 2021-02-23 MED ORDER — CEFAZOLIN SODIUM-DEXTROSE 2-4 GM/100ML-% IV SOLN
INTRAVENOUS | Status: AC
  Filled 2021-02-23: qty 100

## 2021-02-23 MED ORDER — ONDANSETRON HCL 4 MG/2ML IJ SOLN
INTRAMUSCULAR | Status: DC | PRN
Start: 2021-02-23 — End: 2021-02-23
  Administered 2021-02-23: 4 mg via INTRAVENOUS

## 2021-02-23 MED ORDER — ACETAMINOPHEN 10 MG/ML IV SOLN
INTRAVENOUS | Status: DC | PRN
  Administered 2021-02-23: 1000 mg via INTRAVENOUS

## 2021-02-23 MED ORDER — ONDANSETRON HCL 4 MG/2ML IJ SOLN
INTRAMUSCULAR | Status: AC
  Filled 2021-02-23: qty 2

## 2021-02-23 MED ORDER — ORAL CARE MOUTH RINSE: 15.0000 mL | Freq: Once | OROMUCOSAL | Status: AC

## 2021-02-23 MED ORDER — LACTATED RINGERS IV SOLN: INTRAVENOUS | Status: DC | PRN

## 2021-02-23 MED ORDER — FENTANYL CITRATE (PF) 100 MCG/2ML IJ SOLN
INTRAMUSCULAR | Status: AC
  Filled 2021-02-23: qty 2

## 2021-02-23 MED ORDER — DEXAMETHASONE SODIUM PHOSPHATE 10 MG/ML IJ SOLN
INTRAMUSCULAR | Status: DC | PRN
  Administered 2021-02-23: 4 mg via INTRAVENOUS

## 2021-02-23 MED ORDER — FAMOTIDINE 20 MG PO TABS
ORAL_TABLET | ORAL | Status: AC
  Administered 2021-02-23: 20 mg via ORAL
  Filled 2021-02-23: qty 1

## 2021-02-23 MED ORDER — HYDROCODONE-ACETAMINOPHEN 5-325 MG PO TABS
ORAL_TABLET | ORAL | Status: AC
  Filled 2021-02-23: qty 1

## 2021-02-23 MED ORDER — HYDROCODONE-ACETAMINOPHEN 5-325 MG PO TABS: 1.0000 | ORAL_TABLET | Freq: Four times a day (QID) | ORAL | 0 refills | Status: DC | PRN

## 2021-02-23 MED ORDER — DEXAMETHASONE SODIUM PHOSPHATE 10 MG/ML IJ SOLN
INTRAMUSCULAR | Status: AC
  Filled 2021-02-23: qty 1

## 2021-02-23 MED ORDER — TAMSULOSIN HCL 0.4 MG PO CAPS: 0.4000 mg | ORAL_CAPSULE | Freq: Every day | ORAL | 3 refills | Status: DC

## 2021-02-23 MED ORDER — SODIUM CHLORIDE FLUSH 0.9 % IV SOLN
INTRAVENOUS | Status: AC
  Filled 2021-02-23: qty 10

## 2021-02-23 MED ORDER — HYDRALAZINE HCL 20 MG/ML IJ SOLN
INTRAMUSCULAR | Status: AC
  Filled 2021-02-23: qty 1

## 2021-02-23 MED ORDER — CEFAZOLIN SODIUM-DEXTROSE 2-4 GM/100ML-% IV SOLN
2.0000 g | INTRAVENOUS | Status: AC
  Administered 2021-02-23: 2 g via INTRAVENOUS

## 2021-02-23 MED ORDER — ACETAMINOPHEN 10 MG/ML IV SOLN
INTRAVENOUS | Status: AC
  Filled 2021-02-23: qty 100

## 2021-02-23 MED ORDER — PHENYLEPHRINE HCL (PRESSORS) 10 MG/ML IV SOLN
INTRAVENOUS | Status: DC | PRN
  Administered 2021-02-23 (×2): 100 ug via INTRAVENOUS

## 2021-02-23 MED ORDER — HYDRALAZINE HCL 20 MG/ML IJ SOLN
10.0000 mg | Freq: Once | INTRAMUSCULAR | Status: AC
  Administered 2021-02-23: 10 mg via INTRAVENOUS
  Filled 2021-02-23: qty 0.5

## 2021-02-23 MED ORDER — EPHEDRINE SULFATE-NACL 50-0.9 MG/10ML-% IV SOSY
PREFILLED_SYRINGE | INTRAVENOUS | Status: DC | PRN
  Administered 2021-02-23 (×3): 5 mg via INTRAVENOUS

## 2021-02-23 MED ORDER — SUGAMMADEX SODIUM 500 MG/5ML IV SOLN
INTRAVENOUS | Status: AC
  Filled 2021-02-23: qty 5

## 2021-02-23 MED ORDER — ROCURONIUM BROMIDE 100 MG/10ML IV SOLN
INTRAVENOUS | Status: DC | PRN
  Administered 2021-02-23: 5 mg via INTRAVENOUS
  Administered 2021-02-23: 50 mg via INTRAVENOUS

## 2021-02-23 MED ORDER — FUROSEMIDE 10 MG/ML IJ SOLN
INTRAMUSCULAR | Status: DC | PRN
  Administered 2021-02-23: 10 mg via INTRAMUSCULAR

## 2021-02-23 MED ORDER — CHLORHEXIDINE GLUCONATE 0.12 % MT SOLN: 15.0000 mL | Freq: Once | OROMUCOSAL | Status: AC

## 2021-02-23 MED ORDER — FENTANYL CITRATE (PF) 100 MCG/2ML IJ SOLN: 25.0000 ug | INTRAMUSCULAR | Status: DC | PRN

## 2021-02-23 MED ORDER — LIDOCAINE HCL (PF) 2 % IJ SOLN
INTRAMUSCULAR | Status: AC
  Filled 2021-02-23: qty 5

## 2021-02-23 SURGICAL SUPPLY — 32 items
ADAPTER IRRIG TUBE 2 SPIKE SOL (ADAPTER) ×4 IMPLANT
BAG URINE DRAIN 2000ML AR STRL (UROLOGICAL SUPPLIES) IMPLANT
BAG URO DRAIN 4000ML (MISCELLANEOUS) IMPLANT
CATH FOL 2WAY LX 20X30 (CATHETERS) ×2 IMPLANT
CATH FOL 2WAY LX 22X30 (CATHETERS) IMPLANT
CATH FOLEY 3WAY 30CC 22FR (CATHETERS) IMPLANT
CATH URETL OPEN 5X70 (CATHETERS) ×2 IMPLANT
CONTAINER COLLECT MORCELLATR (MISCELLANEOUS) ×1 IMPLANT
DRAPE 3/4 80X56 (DRAPES) ×2 IMPLANT
DRAPE UTILITY 15X26 TOWEL STRL (DRAPES) IMPLANT
FIBER LASER FLEXIVA PULSE 550 (Laser) ×2 IMPLANT
FILTER OVERFLOW MORCELLATOR (FILTER) ×1 IMPLANT
GAUZE 4X4 16PLY ~~LOC~~+RFID DBL (SPONGE) ×4 IMPLANT
GLOVE SURG ENC MOIS LTX SZ6.5 (GLOVE) ×4 IMPLANT
GOWN STRL REUS W/ TWL LRG LVL3 (GOWN DISPOSABLE) ×2 IMPLANT
GOWN STRL REUS W/TWL LRG LVL3 (GOWN DISPOSABLE) ×2
HOLDER FOLEY CATH W/STRAP (MISCELLANEOUS) ×2 IMPLANT
IV NS IRRIG 3000ML ARTHROMATIC (IV SOLUTION) ×18 IMPLANT
KIT TURNOVER CYSTO (KITS) ×2 IMPLANT
MBRN O SEALING YLW 17 FOR INST (MISCELLANEOUS) ×2
MEMBRANE SLNG YLW 17 FOR INST (MISCELLANEOUS) ×1 IMPLANT
MORCELLATOR COLLECT CONTAINER (MISCELLANEOUS) ×2
MORCELLATOR OVERFLOW FILTER (FILTER) ×2
MORCELLATOR ROTATION 4.75 335 (MISCELLANEOUS) ×2 IMPLANT
PACK CYSTO AR (MISCELLANEOUS) ×2 IMPLANT
SET CYSTO W/LG BORE CLAMP LF (SET/KITS/TRAYS/PACK) IMPLANT
SET IRRIG Y TYPE TUR BLADDER L (SET/KITS/TRAYS/PACK) ×2 IMPLANT
SLEEVE PROTECTION STRL DISP (MISCELLANEOUS) ×4 IMPLANT
SYR TOOMEY IRRIG 70ML (MISCELLANEOUS) ×2
SYRINGE TOOMEY IRRIG 70ML (MISCELLANEOUS) ×1 IMPLANT
TUBE PUMP MORCELLATOR PIRANHA (TUBING) ×2 IMPLANT
WATER STERILE IRR 1000ML POUR (IV SOLUTION) ×2 IMPLANT

## 2021-02-23 NOTE — Transfer of Care (Signed)
Immediate Anesthesia Transfer of Care Note  Patient: Christopher Arroyo  Procedure(s) Performed: Holmium Laser Enucleation of the Prostate with Morcellation (Prostate)  Patient Location: PACU  Anesthesia Type:General  Level of Consciousness: awake  Airway & Oxygen Therapy: Patient Spontanous Breathing  Post-op Assessment: Report given to RN  Post vital signs: stable  Last Vitals:  Vitals Value Taken Time  BP 143/97 02/23/21 1101  Temp 36 C 02/23/21 1101  Pulse 66 02/23/21 1101  Resp 18 02/23/21 1101  SpO2 90 % 02/23/21 1101  Vitals shown include unvalidated device data.  Last Pain:  Vitals:   02/23/21 1101  TempSrc: Oral  PainSc: 0-No pain         Complications: No notable events documented.

## 2021-02-23 NOTE — Anesthesia Postprocedure Evaluation (Signed)
Anesthesia Post Note  Patient: Christopher Arroyo  Procedure(s) Performed: Holmium Laser Enucleation of the Prostate with Morcellation (Prostate)  Patient location during evaluation: PACU Anesthesia Type: General Level of consciousness: awake and alert, awake and oriented Pain management: pain level controlled Vital Signs Assessment: post-procedure vital signs reviewed and stable Respiratory status: spontaneous breathing, nonlabored ventilation and respiratory function stable Cardiovascular status: blood pressure returned to baseline and stable Postop Assessment: no apparent nausea or vomiting Anesthetic complications: no   No notable events documented.   Last Vitals:  Vitals:   02/23/21 1200 02/23/21 1206  BP: (!) 141/98 (!) 160/104  Pulse: 62 65  Resp: 20 14  Temp:  (!) 36.2 C  SpO2: 99% 98%    Last Pain:  Vitals:   02/23/21 1206  TempSrc: Temporal  PainSc: 0-No pain                 Manfred Arch

## 2021-02-23 NOTE — Anesthesia Procedure Notes (Signed)
Procedure Name: Intubation Date/Time: 02/23/2021 9:10 AM Performed by: Nani Ravens, CRNA Pre-anesthesia Checklist: Patient identified, Patient being monitored, Timeout performed, Emergency Drugs available and Suction available Patient Re-evaluated:Patient Re-evaluated prior to induction Oxygen Delivery Method: Circle system utilized Preoxygenation: Pre-oxygenation with 100% oxygen Induction Type: IV induction Ventilation: Mask ventilation without difficulty Laryngoscope Size: Mac, 3, McGraph and 4 Grade View: Grade I Tube type: Oral Tube size: 7.5 mm Number of attempts: 1 Airway Equipment and Method: Stylet Placement Confirmation: ETT inserted through vocal cords under direct vision, positive ETCO2 and breath sounds checked- equal and bilateral Secured at: 23 cm Tube secured with: Tape Dental Injury: Teeth and Oropharynx as per pre-operative assessment

## 2021-02-23 NOTE — H&P (Signed)
02/23/21 RRR CTAB  Previously learned self cath, has not had to do it home in the interim  Cleared by neurosurgery, off of Plavix but on ASA  HPI: 75 year old male with refractory urinary symptoms related to BPH who presents today to the office for cystoscopy and prostate sizing.   Please see previous notes for details.    He developed urinary retention in February and underwent multiple voiding trials.  He developed recurrent episode of retention in May.   He has been on Flomax in the interim.  He continues to have refractory symptoms.      Vitals:    11/26/20 0942  BP: 117/78  Pulse: 67    NED. A&Ox3.   No respiratory distress   Abd soft, NT, ND Normal phallus with bilateral descended testicles     Cystoscopy Procedure Note   Patient identification was confirmed, informed consent was obtained, and patient was prepped using Betadine solution.  Lidocaine jelly was administered per urethral meatus.     Preoperative abx where received prior to procedure.       Pre-Procedure: - Inspection reveals a normal caliber ureteral meatus.   Procedure: The flexible cystoscope was introduced without difficulty - No urethral strictures/lesions are present. - Enlarged prostate trilobar coaptation - Normal bladder neck - Bilateral ureteral obscured secondary to median lobe and elevated bladder neck - Bladder mucosa  reveals mild resolving catheter cystitis and posterior bladder wall otherwise no erythema or tumors - No bladder stones -Moderate trabeculation narrow posterior wall diverticulum   Retroflexion shows intravesical protrusion of lateral lobes as well as a discrete median lobe     Post-Procedure: - Patient tolerated the procedure well     Prostate transrectal ultrasound sizing   Informed consent was obtained after discussing risks/benefits of the procedure.  A time out was performed to ensure correct patient identity.   Pre-Procedure: -Transrectal probe was placed  without difficulty -Transrectal Ultrasound performed revealing a 138.21 gm prostate measuring 6.56 x 5.56 x 7.23 cm (length)     Assessment/ Plan:   1. Benign prostatic hyperplasia with urinary obstruction Lengthy discussion today about management options of his massive BPH and recurrent urinary retention   At this point, he would likely best served with an outlet procedure for the purpose of bladder preservation/health.  Alternatives including maximal pharmacotherapy were also reviewed.   We discussed alternatives including TURP vs. holmium laser enucleation of the prostate vs. greenlight laser ablation. Differences between the surgical procedures were discussed as well as the risks and benefits of each.  He is most interested in HoLEP.   We discussed the common postoperative course following holep including need for overnight Foley catheter, temporary worsening of irritative voiding symptoms, and occasional stress incontinence which typically lasts up to 6 months but can persist.  We discussed retrograde ejaculation and damage to surrounding structures including the urinary sphincter. Other uncommon complications including hematuria and urinary tract infection.   He understands all of the above and is willing to proceed as planned.   - Urinalysis, Complete - CULTURE, URINE COMPREHENSIVE   2. History of urinary retention Recurrent episodes, both provoked and unprovoked       Vanna Scotland, MD

## 2021-02-23 NOTE — Anesthesia Preprocedure Evaluation (Addendum)
Anesthesia Evaluation  Patient identified by MRN, date of birth, ID band Patient awake    Reviewed: Allergy & Precautions, NPO status , Patient's Chart, lab work & pertinent test results  Airway Mallampati: III  TM Distance: >3 FB Neck ROM: Full    Dental  (+) Teeth Intact   Pulmonary neg pulmonary ROS, former smoker,    Pulmonary exam normal        Cardiovascular hypertension, Pt. on medications negative cardio ROS Normal cardiovascular exam     Neuro/Psych  Headaches, PSYCHIATRIC DISORDERS Anxiety Depression TIA   GI/Hepatic hiatal hernia, (+) Hepatitis -  Endo/Other  diabetesHypothyroidism   Renal/GU negative Renal ROS  negative genitourinary   Musculoskeletal negative musculoskeletal ROS (+)   Abdominal   Peds negative pediatric ROS (+)  Hematology negative hematology ROS (+)   Anesthesia Other Findings Anxiety and depression   Current use of long term anticoagulation  DAPT (ASA + clopidogrel) Diabetes mellitus without complication History of hiatal hernia    HLD (hyperlipidemia)    Hypertension    Hypothyroidism    PSA elevation    Right internal carotid artery aneurysm  s/p pipeline embolization on 08/08/2020  T2DM (type 2 diabetes mellitus) (HCC) 09/02/2020   TIA (transient ischemic attack) 2009 left hand weakness, decrease fine motor skills     Reproductive/Obstetrics negative OB ROS                             Anesthesia Physical Anesthesia Plan  ASA: 3  Anesthesia Plan: General   Post-op Pain Management:    Induction: Intravenous  PONV Risk Score and Plan: 2 and Midazolam and Ondansetron  Airway Management Planned: Oral ETT and Video Laryngoscope Planned  Additional Equipment:   Intra-op Plan:   Post-operative Plan: Extubation in OR  Informed Consent: I have reviewed the patients History and Physical, chart, labs and discussed the procedure including the  risks, benefits and alternatives for the proposed anesthesia with the patient or authorized representative who has indicated his/her understanding and acceptance.     Dental advisory given  Plan Discussed with: CRNA, Anesthesiologist and Surgeon  Anesthesia Plan Comments:         Anesthesia Quick Evaluation

## 2021-02-23 NOTE — Discharge Instructions (Addendum)
AMBULATORY SURGERY  DISCHARGE INSTRUCTIONS   The drugs that you were given will stay in your system until tomorrow so for the next 24 hours you should not:  Drive an automobile Make any legal decisions Drink any alcoholic beverage   You may resume regular meals tomorrow.  Today it is better to start with liquids and gradually work up to solid foods.  You may eat anything you prefer, but it is better to start with liquids, then soup and crackers, and gradually work up to solid foods.   Please notify your doctor immediately if you have any unusual bleeding, trouble breathing, redness and pain at the surgery site, drainage, fever, or pain not relieved by medication.    Additional Instructions:        Please contact your physician with any problems or Same Day Surgery at [phone], Monday through Friday 6 am to 4 pm, or [place] [facility] number at [phone].Holmium Laser Enucleation of the Prostate (HoLEP)  HoLEP is a treatment for men with benign prostatic hyperplasia (BPH). The laser surgery removed blockages of urine flow, and is done without any incisions on the body.     What is HoLEP?  HoLEP is a type of laser surgery used to treat obstruction (blockage) of urine flow as a result of benign prostatic hyperplasia (BPH). In men with BPH, the prostate gland is not cancerous, but has become enlarged. An enlarged prostate can result in a number of urinary tract symptoms such as weak urinary stream, difficulty in starting urination, inability to urinate, frequent urination, or getting up at night to urinate.  HoLEP was developed in the 1990's as a more effective and less expensive surgical option for BPH, compared to other surgical options such as laser vaporization(PVP/greenlight laser), transurethral resection of the prostate(TURP), and open simple prostatectomy.   What happens during a HoLEP?  HoLEP requires general anesthesia ("asleep" throughout the  procedure).   An antibiotic is given to reduce the risk of infection  A surgical instrument called a resectoscope is inserted through the urethra (the tube that carries urine from the bladder). The resectoscope has a camera that allows the surgeon to view the internal structure of the prostate gland, and to see where the incisions are being made during surgery.  The laser is inserted into the resectoscope and is used to enucleate (free up) the enlarged prostate tissue from the capsule (outer shell) and then to seal up any blood vessels. The tissue that has been removed is pushed back into the bladder.  A morcellator is placed through the resectoscope, and is used to suction out the prostate tissue that has been pushed into the bladder.  When the prostate tissue has been removed, the resectoscope is removed, and a foley catheter is placed to allow healing and drain the urine from the bladder.     What happens after a HoLEP?  More than 90% of patients go home the same day a few hours after surgery. Less than 10% will be admitted to the hospital overnight for observation to monitor the urine, or if they have other medical problems.  Fluid is flushed through the catheter for about 1 hour after surgery to clear any blood from the urine. It is normal to have some blood in the urine after surgery. The need for blood transfusion is extremely rare.  Eating and drinking are permitted after the procedure once the patient has fully awakened from anesthesia.  The catheter is usually removed 2-3 days   after surgery- the patient will come to clinic to have the catheter removed and make sure they can urinate on their own.  It is very important to drink lots of fluids after surgery for one week to keep the bladder flushed.  At first, there may be some burning with urination, but this typically improved within a few hours to days. Most patients do not have a significant amount of pain, and narcotic pain  medications are rarely needed.  Symptoms of urinary frequency, urgency, and even leakage are NORMAL for the first few weeks after surgery as the bladder adjusts after having to work hard against blockage from the prostate for many years. This will improve, but can sometimes take several months.  The use of pelvic floor exercises (Kegel exercises) can help improve problems with urinary incontinence.   After catheter removal, patients will be seen at 6 weeks and 6 months for symptom check  No heavy lifting for at least 2-3 weeks after surgery, however patients can walk and do light activities the first day after surgery. Return to work time depends on occupation.    What are the advantages of HoLEP?  HoLEP has been studied in many different parts of the world and has been shown to be a safe and effective procedure. Although there are many types of BPH surgeries available, HoLEP offers a unique advantage in being able to remove a large amount of tissue without any incisions on the body, even in very large prostates, while decreasing the risk of bleeding and providing tissue for pathology (to look for cancer). This decreases the need for blood transfusions during surgery, minimizes hospital stay, and reduces the risk of needing repeat treatment.  What are the side effects of HoLEP?  Temporary burning and bleeding during urination. Some blood may be seen in the urine for weeks after surgery and is part of the healing process.  Urinary incontinence (inability to control urine flow) is expected in all patients immediately after surgery and they should wear pads for the first few days/weeks. This typically improves over the course of several weeks. Performing Kegel exercises can help decrease leakage from stress maneuvers such as coughing, sneezing, or lifting. The rate of long term leakage is very low. Patients may also have leakage with urgency and this may be treated with medication. The risk of urge  incontinence can be dependent on several factors including age, prostate size, symptoms, and other medical problems.  Retrograde ejaculation or "backwards ejaculation." In 75% of cases, the patient will not see any fluid during ejaculation after surgery.  Erectile function is generally not significantly affected.   What are the risks of HoLEP?  Injury to the urethra or development of scar tissue at a later date  Injury to the capsule of the prostate (typically treated with longer catheterization).  Injury to the bladder or ureteral orifices (where the urine from the kidney drains out)  Infection of the bladder, testes, or kidneys  Return of urinary obstruction at a later date requiring another operation (<2%)  Need for blood transfusion or re-operation due to bleeding  Failure to relieve all symptoms and/or need for prolonged catheterization after surgery  5-15% of patients are found to have previously undiagnosed prostate cancer in their specimen. Prostate cancer can be treated after HoLEP.  Standard risks of anesthesia including blood clots, heart attacks, etc  When should I call my doctor?  Fever over 101.3 degrees  Inability to urinate, or large blood clots in the urine   

## 2021-02-23 NOTE — Op Note (Signed)
Date of procedure: 02/23/21  Preoperative diagnosis:  BPH with BOO History of urinary retention  Postoperative diagnosis:  same   Procedure: HoLEP with morcellation  Surgeon: Vanna Scotland, MD  Anesthesia: General  Complications: None  Intraoperative findings: Trilobar coaptation with median lobe, moderately trabeculated bladder with saccules  EBL: minimal  Specimens: prostate chips  Drains: 20 Fr 2-way foley  Indication: Christopher Arroyo is a 75 y.o. patient with history of urinary retention, BPH with BOO.  After reviewing the management options for treatment, he elected to proceed with the above surgical procedure(s). We have discussed the potential benefits and risks of the procedure, side effects of the proposed treatment, the likelihood of the patient achieving the goals of the procedure, and any potential problems that might occur during the procedure or recuperation. Informed consent has been obtained.  Description of procedure:  The patient was taken to the operating room and general anesthesia was induced.  The patient was placed in the dorsal lithotomy position, prepped and draped in the usual sterile fashion, and preoperative antibiotics were administered. A preoperative time-out was performed.     A 26 French resectoscope sheath using a blunt angled obturator was introduced without dificulty into the bladder.  The bladder was carefully inspected and noted to be moderately trabeculated.  There is an elevated bladder neck with a very small intravesical component.  The trigone was able to be visualized with some manipulation and the UOs were visible away from the bladder neck itself.  The prostatic fossa had significant trilobar coaptation with greater than 5 cm prostatic length.  A 550 m laser fiber was then brought in and using settings of 2 J's and 50 Hz, 2 incisions were created at the 5:00 and 7:00 positions of the bladder neck on either side of the median lobe down to the  level of the bladder neck/capsular fibers.  The incision was carried down caudally meeting in the midline just above the verumontanum.  The median lobe was then enucleated from a caudal to cranial direction cleaving the adenoma off the underlying capsule rolling it towards the bladder neck and ultimately cleaving the mucosa to free the median lobe into the bladder.   Next, a semilunar incision was created at the prostatic apex on the left side again freeing up the adenoma from the underlying capsule.  Care was taken to avoid any resection past the verumontanum.  This incision was carried around laterally and cranially towards the bladder neck.  Ultimately, I was able to complete the anterior commissure mucosa and the adenoma into the bladder creating a widely patent prostatic fossa.     Next, the same similar incision was created at the right prostatic apex.   Once this was completed and cleared from the bladder neck, the prostatic fossa was noted to be widely patent.  Hemostasis was achieved using hemostatic fiber settings.  Bilateral UOs were visualized and free of any injury.  Finally, the 66 French resectoscope was exchanged for nephroscope and using the Piranha handpiece morcellator, the bladder was distended in each of the prostate chips were evacuated.  The bladder was irrigated several times and smaller chips were cleared from the bladder.  This point time, there were no residual fibers appreciated in the bladder.  Hemostasis was adequate.  10 mg of IV Lasix was administered to help with postoperative diuresis.  A 20 French two-way Foley catheter was then inserted over a catheter guide with 36 cc in the balloon.  The catheter irrigated easily  and well.  Patient was then clean and dry, repositioned supine position, reversed from anesthesia, taken to PACU in stable condition.   Plan: Patient will return to the office in 2 for voiding trial.      Vanna Scotland, M.D.

## 2021-02-24 LAB — SURGICAL PATHOLOGY

## 2021-02-25 ENCOUNTER — Other Ambulatory Visit: Payer: Self-pay

## 2021-02-25 ENCOUNTER — Ambulatory Visit: Payer: Medicare Other

## 2021-02-25 DIAGNOSIS — N400 Enlarged prostate without lower urinary tract symptoms: Secondary | ICD-10-CM

## 2021-02-25 NOTE — Progress Notes (Signed)
Catheter Removal  Patient is present today for a catheter removal.  57ml of water was drained from the balloon. A 20FR foley cath was removed from the bladder no complications were noted . Patient tolerated well.  Performed by: Kiffany Schelling, CMA   

## 2021-02-26 ENCOUNTER — Telehealth: Payer: Self-pay | Admitting: *Deleted

## 2021-02-26 ENCOUNTER — Ambulatory Visit: Payer: Medicare Other | Admitting: Internal Medicine

## 2021-02-26 NOTE — Telephone Encounter (Addendum)
Left patient VM with details, asked to return call with any questions.    ----- Message from Vanna Scotland, MD sent at 02/24/2021  4:54 PM EDT ----- Surgical pathology is unremarkable.   Vanna Scotland, MD

## 2021-03-04 ENCOUNTER — Encounter: Payer: Self-pay | Admitting: Urology

## 2021-03-04 ENCOUNTER — Other Ambulatory Visit: Payer: Self-pay

## 2021-03-04 ENCOUNTER — Ambulatory Visit (INDEPENDENT_AMBULATORY_CARE_PROVIDER_SITE_OTHER): Payer: Medicare Other | Admitting: Urology

## 2021-03-04 VITALS — BP 165/93 | HR 73 | Ht 68.0 in | Wt 158.0 lb

## 2021-03-04 DIAGNOSIS — N138 Other obstructive and reflux uropathy: Secondary | ICD-10-CM

## 2021-03-04 DIAGNOSIS — R102 Pelvic and perineal pain: Secondary | ICD-10-CM

## 2021-03-04 DIAGNOSIS — R300 Dysuria: Secondary | ICD-10-CM

## 2021-03-04 DIAGNOSIS — R972 Elevated prostate specific antigen [PSA]: Secondary | ICD-10-CM

## 2021-03-04 DIAGNOSIS — N401 Enlarged prostate with lower urinary tract symptoms: Secondary | ICD-10-CM

## 2021-03-04 LAB — BLADDER SCAN AMB NON-IMAGING

## 2021-03-04 MED ORDER — SULFAMETHOXAZOLE-TRIMETHOPRIM 800-160 MG PO TABS
1.0000 | ORAL_TABLET | Freq: Two times a day (BID) | ORAL | 0 refills | Status: DC
Start: 2021-03-04 — End: 2021-04-02

## 2021-03-04 MED ORDER — UROGESIC-BLUE 81.6 MG PO TABS: 1.0000 | ORAL_TABLET | Freq: Four times a day (QID) | ORAL | 0 refills | Status: DC | PRN

## 2021-03-04 NOTE — Progress Notes (Signed)
03/04/2021 2:12 PM   Christopher Arroyo 08/05/1945 [id number]  Referring provider: Etta Grandchild, MD [address] [place],  [place] [address]  Urological history: 1. Urinary retention -postoperative urinary retention after an endarterectomy 08/2020 -s/p HoLEP 02/23/2021  2. Elevated PSA -PSA Trend  4.02 in 06/2019  4.39 in 06/2018  4.97 in 05/2018  3.93 in 06/2017 -prostate chips from HoLEP 02/2021 - negative for malignancy  3. BPH -managed with tamsulosin 0.8 mg daily  4. High risk hematuria -former smoker -micro heme with Foley catheter   Chief Complaint  Patient presents with   Dysuria     HPI: Christopher Arroyo is a 75 y.o. male who presents today for pain after urinating.   For the last 2 days he has been experiencing a suprapubic pain that begins after the completion of urination.  He states he is having urinary frequency, but he does not urinate with a strong urinary stream.  He states he is emptying his bladder.  He is wearing depends at this time due to moments of incontinence.  Patient denies any modifying or aggravating factors.  Patient denies any gross hematuria, dysuria or suprapubic/flank pain.  Patient denies any fevers, chills, nausea or vomiting.    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   T2DM (type 2 diabetes mellitus) (HCC) 09/02/2020   TIA (transient ischemic attack) 2009   left hand weakness, decrease fine motor skills    Surgical History: Past Surgical History:  Procedure Laterality Date   CAROTID ANGIOGRAM Right 02/20/2021    CEREBRAL ANGIOGRAM N/A 04/24/2020   Location: [facility]   SP EMBOLIZATION INTRACRANIAL Right 08/08/2020   4.75 x 14 mm pipeline embolization device (PED) placement to RIGHT ICA cavernous segment aneurysm; Location: [facility]; Surgeon: Johnsie Cancel, MD   TONSILLECTOMY      Home Medications:  Allergies as of 03/04/2021   No Known Allergies      Medication List        Accurate as of March 04, 2021  2:12 PM. If you have any questions, ask your nurse or doctor.          aspirin EC 81 MG tablet Take 81 mg by mouth daily. Swallow whole.   citalopram 20 MG tablet Commonly known as: CELEXA Take 20 mg by mouth daily.   clopidogrel 75 MG tablet Commonly known as: PLAVIX Take 75 mg by mouth daily.   dorzolamide-timolol 22.3-6.8 MG/ML ophthalmic solution Commonly known as: COSOPT Place 1 drop into both eyes 2 (two) times daily.   HYDROcodone-acetaminophen 5-325 MG tablet Commonly known as: NORCO/VICODIN Take 1-2 tablets by mouth every 6 (six) hours as needed for moderate pain.   levothyroxine 75 MCG tablet Commonly known as: SYNTHROID Take 1 tablet (75 mcg total) by mouth daily before breakfast.   lisinopril-hydrochlorothiazide 20-25 MG tablet Commonly known as: ZESTORETIC Take 1 tablet by mouth daily.   metFORMIN 500 MG tablet Commonly known as: GLUCOPHAGE Take 500 mg by mouth daily with breakfast.   multivitamin with  minerals Tabs tablet Take 1 tablet by mouth daily.   oxybutynin 5 MG tablet Commonly known as: DITROPAN Take 1 tablet (5 mg total) by mouth every 8 (eight) hours as needed for bladder spasms.   rosuvastatin 20 MG tablet Commonly known as: CRESTOR Take 1 tablet (20 mg total) by mouth daily.   sulfamethoxazole-trimethoprim 800-160 MG tablet Commonly known as: BACTRIM DS Take 1 tablet by mouth every 12 (twelve) hours. Started by: Michiel Cowboy, PA-C   tamsulosin 0.4 MG Caps capsule Commonly known as: FLOMAX Take 1 capsule (0.4 mg total) by mouth daily.    traZODone 50 MG tablet Commonly known as: DESYREL Take 50 mg by mouth at bedtime.   Urogesic-Blue 81.6 MG Tabs Take 1 tablet (81.6 mg total) by mouth every 6 (six) hours as needed. Started by: Michiel Cowboy, PA-C        Allergies: No Known Allergies  Family History: No family history on file.  Social History:  reports that he quit smoking about 38 years ago. His smoking use included cigarettes. He has never used smokeless tobacco. He reports that he does not currently use alcohol. 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  ROS: Pertinent ROS in HPI  Physical Exam: BP (!) 165/93   Pulse 73   Ht 5\' 8"  (1.727 m)   Wt 158 lb (71.7 kg)   BMI 24.02 kg/m   Constitutional:  Well nourished. Alert and oriented, No acute distress. HEENT: [place] AT, mask in place.  Trachea midline Cardiovascular: No clubbing, cyanosis, or edema. Respiratory: Normal respiratory effort, no increased work of breathing. GU: No CVA tenderness.  No bladder fullness or masses.  Patient with uncircumcised phallus. Foreskin easily retracted  Urethral meatus is patent.  No penile discharge. No penile lesions or rashes. Scrotum without lesions, cysts, rashes and/or edema.  Testicles are located scrotally bilaterally. No masses are appreciated in the testicles. Left and right epididymis are normal. Neurologic: Grossly intact, no focal deficits, moving all 4 extremities. Psychiatric: Normal mood and affect.   Laboratory Data: Component     Latest Ref Rng & Units 02/05/2021  WBC     4.0 - 10.5 K/uL 6.6  RBC     4.22 - 5.81 Mil/uL 4.76  Hemoglobin     13.0 - 17.0 g/dL 04/07/2021  HCT     16.1 - 09.6 % 42.4  MCV     78.0 - 100.0 fl 89.1  MCH     26.0 - 34.0 pg   MCHC     30.0 - 36.0 g/dL 04.5  RDW     40.9 - 81.1 % 15.0  Platelets     150.0 - 400.0 K/uL 161.0  nRBC     0.0 - 0.2 %   Neutrophils     43.0 - 77.0 % 73.0  NEUT#     1.4 - 7.7 K/uL 4.8   Lymphocytes     12.0 - 46.0 % 18.8  Lymphocyte #     0.7 - 4.0 K/uL 1.3  Monocytes Relative     3.0 - 12.0 % 6.2  Monocyte #     0.1 - 1.0 K/uL 0.4  Eosinophil     0.0 - 5.0 % 1.7  Eosinophils Absolute     0.0 - 0.7 K/uL 0.1  Basophil     0.0 - 3.0 % 0.3  Basophils Absolute     0.0 - 0.1 K/uL 0.0  Immature Granulocytes     %  Abs Immature Granulocytes     0.00 - 0.07 K/uL   WBC, UA     0 - 5 /hpf   Epithelial Cells (non renal)     0 - 10 /hpf   Bacteria, UA     None seen/Few    Component     Latest Ref Rng & Units 02/05/2021  Sodium     135 - 145 mEq/L 138  Potassium     3.5 - 5.1 mEq/L 4.3  Chloride     96 - 112 mEq/L 100  CO2     19 - 32 mEq/L 32  Glucose     70 - 99 mg/dL 95  BUN     6 - 23 mg/dL 11  Creatinine     1.22 - 1.50 mg/dL 4.82  GFR     >50.03 mL/min 65.59  Calcium     8.4 - 10.5 mg/dL 9.3   Urinalysis Component     Latest Ref Rng & Units 03/04/2021  Specific Gravity, UA     1.005 - 1.030 1.025  pH, UA     5.0 - 7.5 6.0  Color, UA     Yellow Orange  Appearance Ur     Clear Cloudy (A)  Leukocytes,UA     Negative 1+ (A)  Protein,UA     Negative/Trace 3+ (A)  Glucose, UA     Negative Negative  Ketones, UA     Negative Negative  RBC, UA     Negative 3+ (A)  Bilirubin, UA     Negative Negative  Urobilinogen, Ur     0.2 - 1.0 mg/dL 0.2  Nitrite, UA     Negative Negative  Microscopic Examination      See below:   Component     Latest Ref Rng & Units 03/04/2021  WBC, UA     0 - 5 /hpf 11-30 (A)  RBC     0 - 2 /hpf >30 (A)  Epithelial Cells (non renal)     0 - 10 /hpf 0-10  Casts     None seen /lpf Present (A)  Cast Type     N/A Hyaline casts  Bacteria, UA     None seen/Few Few   I have reviewed the labs.   Pertinent Imaging: Results for ANDRIC, KERCE (MRN [id number]) as of 03/04/2021 14:10  Ref. Range 03/04/2021 13:45  Scan Result Unknown 82mL   Assessment & Plan:    1. Suprapubic pain -UA -urine culture -Start  Septra DS, twice daily for 7 days- will adjust if necessary once culture is available  -Given Urogesic blue, 12 tablets samples for the pain  2. Elevated PSA -Prostate chips from HoLEP procedure August 2022 negative for malignancy  3. BPH -PVR < 300 cc -s/p HoLEP  Return for Keep follow up appointment with Dr. Apolinar Junes 09/27.  These notes generated with voice recognition software. I apologize for typographical errors.  Michiel Cowboy, PA-C  [facility] [address]  [address] [place], [place] [address] [phone]

## 2021-03-06 LAB — MICROSCOPIC EXAMINATION: RBC, Urine: >30 /hpf — AB (ref 0–2)

## 2021-03-06 LAB — URINALYSIS, COMPLETE
Bilirubin, UA: NEGATIVE
Glucose, UA: NEGATIVE
Ketones, UA: NEGATIVE
Nitrite, UA: NEGATIVE
Specific Gravity, UA: 1.025 (ref 1.005–1.030)
Urobilinogen, Ur: 0.2 mg/dL (ref 0.2–1.0)
pH, UA: 6 (ref 5.0–7.5)

## 2021-03-08 LAB — CULTURE, URINE COMPREHENSIVE

## 2021-03-12 ENCOUNTER — Telehealth: Payer: Self-pay

## 2021-03-12 DIAGNOSIS — R102 Pelvic and perineal pain: Secondary | ICD-10-CM

## 2021-03-12 MED ORDER — UROGESIC-BLUE 81.6 MG PO TABS
1.0000 | ORAL_TABLET | Freq: Four times a day (QID) | ORAL | 0 refills | Status: DC | PRN
Start: 2021-03-12 — End: 2021-03-13

## 2021-03-12 NOTE — Telephone Encounter (Signed)
Pt called office stating he is still having pain after urinating. As per Dr. Apolinar Junes have pt start the Urogesic, can take AZO OTC as well. Also advised pt supportive care. Dr. Apolinar Junes offered pt to come in to clinic to pick up lidocaine jelly to insert into the urethra . Pt would like to try medications first. Pt verbalized understanding and will proceed to pharmacy.

## 2021-03-13 ENCOUNTER — Ambulatory Visit: Payer: Medicare Other | Admitting: Internal Medicine

## 2021-03-13 ENCOUNTER — Telehealth: Payer: Self-pay

## 2021-03-13 DIAGNOSIS — R102 Pelvic and perineal pain: Secondary | ICD-10-CM

## 2021-03-13 MED ORDER — UROGESIC-BLUE 81.6 MG PO TABS
1.0000 | ORAL_TABLET | Freq: Four times a day (QID) | ORAL | 0 refills | Status: DC | PRN
Start: 2021-03-13 — End: 2021-04-14

## 2021-03-13 NOTE — Telephone Encounter (Signed)
Pt called back again stating [facility]'t [facility], [facility] and they where preparing [facility] medication.

## 2021-03-13 NOTE — Telephone Encounter (Signed)
Incoming message on triage line from pt stating that he was expecting an RX to be sent to the pharmacy for Urogesic-Blue. Pharmacy states that they do not have RX. The medication is listed in the chart as a sample.

## 2021-03-13 NOTE — Telephone Encounter (Signed)
Please call this patient, he needed to know if he is getting the script, he is in pain and needs to hear from you  [phone]   [id number] [place], [place] - [address] Phone:  [phone]  Fax:  [phone]

## 2021-03-13 NOTE — Telephone Encounter (Signed)
Medication sent to pharmacy. Pt to get OTC AZO if needed as well

## 2021-03-30 NOTE — Progress Notes (Incomplete)
03/30/21 1:00 PM   Christopher Arroyo 1945-07-11 [id number]  Referring provider:  Etta Grandchild, MD [address] [place],  [place] [address] No chief complaint on file.    HPI: Christopher Arroyo is a 75 y.o.male with a personal history of urinary retention elevated PSA, BPH, and high risk hematuria, who presents today for a 6 week post-op HoLEP follow-up with IPSS and PVR.   He is s/p endarterectomy on 08/2020.   He is also s/p HoLEP on 02/23/2021. Intraoperative findings showed trilobar coaptation with median lobe, moderately trabeculated bladder with saccules. Prostate chips showed negative for malignancy.    He was recently seen in office by Michiel Cowboy, PA-C, for pain after urinating on 02/04/2021. UA showed 11-30 WBCs, greater than 30 RBCs and few bacteria. Urine culture was negative for infection.       PMH: Past Medical History:  Diagnosis Date   Anxiety and depression    Current use of long term anticoagulation    DAPT (ASA + clopidogrel)   Diabetes mellitus without complication (HCC)    History of hiatal hernia    HLD (hyperlipidemia)    Hypertension    Hypothyroidism    PSA elevation    Right internal carotid artery aneurysm    s/p pipeline embolization on 08/08/2020   T2DM (type 2 diabetes mellitus) (HCC) 09/02/2020   TIA (transient ischemic attack) 2009   left hand weakness, decrease fine motor skills    Surgical History: Past Surgical History:  Procedure Laterality Date   CAROTID ANGIOGRAM Right 02/20/2021   CEREBRAL ANGIOGRAM N/A 04/24/2020   Location: [facility]   SP EMBOLIZATION INTRACRANIAL Right 08/08/2020   4.75 x 14 mm pipeline embolization device (PED) placement to RIGHT ICA cavernous segment aneurysm; Location: [facility]; Surgeon: Johnsie Cancel, MD   TONSILLECTOMY      Home Medications:  Allergies as of 03/31/2021   No Known Allergies      Medication List        Accurate as of March 30, 2021  1:00 PM. If you have any questions, ask your nurse  or doctor.          aspirin EC 81 MG tablet Take 81 mg by mouth daily. Swallow whole.   citalopram 20 MG tablet Commonly known as: CELEXA Take 20 mg by mouth daily.   clopidogrel 75 MG tablet Commonly known as: PLAVIX Take 75 mg by mouth daily.   dorzolamide-timolol 22.3-6.8 MG/ML ophthalmic solution Commonly known as: COSOPT Place 1 drop into both eyes 2 (two) times daily.   HYDROcodone-acetaminophen 5-325 MG tablet Commonly known as: NORCO/VICODIN Take 1-2 tablets by mouth every 6 (six) hours as needed for moderate pain.   levothyroxine 75 MCG tablet Commonly known as: SYNTHROID Take 1 tablet (75 mcg total) by mouth daily before breakfast.   lisinopril-hydrochlorothiazide 20-25 MG tablet Commonly known as: ZESTORETIC Take 1 tablet by mouth daily.   metFORMIN 500 MG tablet Commonly known as: GLUCOPHAGE Take 500 mg by mouth daily with breakfast.   multivitamin with minerals Tabs tablet Take 1 tablet by mouth daily.   oxybutynin 5 MG tablet Commonly known as: DITROPAN Take 1 tablet (5 mg total) by mouth every 8 (eight) hours as needed for bladder spasms.   rosuvastatin 20 MG tablet Commonly known as: CRESTOR Take 1 tablet (20 mg total) by mouth daily.   sulfamethoxazole-trimethoprim 800-160 MG tablet Commonly known as: BACTRIM DS Take 1 tablet by mouth every 12 (twelve) hours.   tamsulosin 0.4 MG Caps capsule  Commonly known as: FLOMAX Take 1 capsule (0.4 mg total) by mouth daily.   traZODone 50 MG tablet Commonly known as: DESYREL Take 50 mg by mouth at bedtime.   Urogesic-Blue 81.6 MG Tabs Take 1 tablet (81.6 mg total) by mouth every 6 (six) hours as needed.        Allergies: No Known Allergies  Family History: No family history on file.  Social History:  reports that he quit smoking about 38 years ago. His smoking use included cigarettes. He has never used smokeless tobacco. He reports that he does not currently use alcohol. 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   Physical Exam: There were no vitals taken for this visit.  Constitutional:  Alert and oriented, No acute distress. HEENT: [place] AT, moist mucus membranes.  Trachea midline, no masses. Cardiovascular: No clubbing, cyanosis, or edema. Respiratory: Normal respiratory effort, no increased work of breathing. Skin: No rashes, bruises or suspicious lesions. Neurologic: Grossly intact, no focal deficits, moving all 4 extremities. Psychiatric: Normal mood and affect.  Laboratory Data:  Lab Results  Component Value Date   CREATININE 1.12 02/05/2021    Lab Results  Component Value Date   PSA 4.39 07/02/2019     Lab Results  Component Value Date   HGBA1C 5.9 02/05/2021    Urinalysis   Pertinent Imaging:  Assessment & Plan:     No follow-ups on file.  I,Kailey Littlejohn,acting as a Neurosurgeon for Vanna Scotland, MD.,have documented all relevant documentation on the behalf of Vanna Scotland, MD,as directed by  Vanna Scotland, MD while in the presence of Vanna Scotland, MD.   [facility] [address], [address] [place], [place] [address] [phone]

## 2021-03-31 ENCOUNTER — Encounter: Payer: Medicare Other | Admitting: Urology

## 2021-04-01 ENCOUNTER — Encounter: Payer: Self-pay | Admitting: Urology

## 2021-04-02 ENCOUNTER — Telehealth: Payer: Self-pay | Admitting: Urology

## 2021-04-02 ENCOUNTER — Other Ambulatory Visit: Payer: Self-pay | Admitting: *Deleted

## 2021-04-02 NOTE — Telephone Encounter (Signed)
Informed per [name], PA to keep follow up with Dr. Apolinar Junes. Patient advised.Voiced understanding. Patient is aware to call with any other symptoms.

## 2021-04-02 NOTE — Telephone Encounter (Signed)
Pt called office stating he is still having pain after urination post op.  He is out of abx, he has taken the entire RX.  He states pain is not as bad as it was, but it's still there.  On a scale from 1-10, he's a 4 now.  He didn't realize he had a post-op appt and was r/s.  [phone]

## 2021-04-02 NOTE — Telephone Encounter (Signed)
Spoke with patient regarding symptoms. He has been having frequency with some dysuria and taking Urogesic blue helped with those symptoms which he clarified he ran out of, Denies any other symptoms.

## 2021-04-14 ENCOUNTER — Other Ambulatory Visit: Payer: Self-pay

## 2021-04-14 ENCOUNTER — Encounter: Payer: Self-pay | Admitting: Urology

## 2021-04-14 ENCOUNTER — Ambulatory Visit (INDEPENDENT_AMBULATORY_CARE_PROVIDER_SITE_OTHER): Payer: Medicare Other | Admitting: Urology

## 2021-04-14 VITALS — BP 191/107 | HR 65 | Ht 68.0 in | Wt 158.0 lb

## 2021-04-14 DIAGNOSIS — R972 Elevated prostate specific antigen [PSA]: Secondary | ICD-10-CM

## 2021-04-14 DIAGNOSIS — R338 Other retention of urine: Secondary | ICD-10-CM

## 2021-04-14 DIAGNOSIS — N401 Enlarged prostate with lower urinary tract symptoms: Secondary | ICD-10-CM

## 2021-04-14 DIAGNOSIS — R102 Pelvic and perineal pain: Secondary | ICD-10-CM

## 2021-04-14 LAB — BLADDER SCAN AMB NON-IMAGING: Scan Result: 71

## 2021-04-14 NOTE — Progress Notes (Signed)
04/14/2021 2:32 PM   Christopher Arroyo Mar 21, 1946 [id number]  Referring provider: Etta Grandchild, MD [address] [place],  [place] [address]  Chief Complaint  Patient presents with   Post-op Follow-up    HPI: 75 year old male with personal history of prostamegaly, BPH with outlet obstruction, urinary retention status post holep on 02/2021 who returns today for routine follow-up.  His postoperative course was complicated by suprapubic pain.  He was seen by Lionel December and managed for presumptive UTI (culture negative ) as well as Urogesic blue.  Surgical pathology was benign, 53.5 g resection.  Today, he reports that he is extremely pleased with his flow in the ability to empty.  He denies any overt dysuria.  He did have an episode of a small blood clot a few days ago but none since.  He does feel like his urine is little bit cloudy.  His primary complaint today is he feels like he is getting kicked in the perineum immediately after voiding which lasts a few minutes and then resolves.  Initially, he was told to take ibuprofen which helped but this is no longer helping.  He is worried about side effects from taking this on a daily basis.   IPSS     Row Name 04/14/21 1400         International Prostate Symptom Score   How often have you had the sensation of not emptying your bladder? Less than 1 in 5     How often have you had to urinate less than every two hours? Less than 1 in 5 times     How often have you found you stopped and started again several times when you urinated? Not at All     How often have you found it difficult to postpone urination? Almost always     How often have you had a weak urinary stream? Not at All     How often have you had to strain to start urination? Less than half the time     How many times did you typically get up at night to urinate? 3 Times     Total IPSS Score 12           Quality of Life due to urinary symptoms   If you were to spend  the rest of your life with your urinary condition just the way it is now how would you feel about that? Unhappy              Score:  1-7 Mild 8-19 Moderate 20-35 Severe   PMH: Past Medical History:  Diagnosis Date   Anxiety and depression    Current use of long term anticoagulation    DAPT (ASA + clopidogrel)   Diabetes mellitus without complication (HCC)    History of hiatal hernia    HLD (hyperlipidemia)    Hypertension    Hypothyroidism    PSA elevation    Right internal carotid artery aneurysm    s/p pipeline embolization on 08/08/2020   T2DM (type 2 diabetes mellitus) (HCC) 09/02/2020   TIA (transient ischemic attack) 2009   left hand weakness, decrease fine motor skills    Surgical History: Past Surgical History:  Procedure Laterality Date   CAROTID ANGIOGRAM Right 02/20/2021   CEREBRAL ANGIOGRAM N/A 04/24/2020   Location: [facility]   SP EMBOLIZATION INTRACRANIAL Right 08/08/2020   4.75 x 14 mm pipeline embolization device (PED) placement to RIGHT ICA cavernous segment aneurysm; Location: [facility]; Surgeon: [name]  Valora Piccolo, MD   TONSILLECTOMY      Home Medications:  Allergies as of 04/14/2021   No Known Allergies      Medication List        Accurate as of April 14, 2021  2:32 PM. If you have any questions, ask your nurse or doctor.          STOP taking these medications    HYDROcodone-acetaminophen 5-325 MG tablet Commonly known as: NORCO/VICODIN Stopped by: Vanna Scotland, MD   tamsulosin 0.4 MG Caps capsule Commonly known as: FLOMAX Stopped by: Vanna Scotland, MD   Urogesic-Blue 81.6 MG Tabs Stopped by: Vanna Scotland, MD       TAKE these medications    aspirin EC 81 MG tablet Take 81 mg by mouth daily. Swallow whole.   citalopram 20 MG tablet Commonly known as: CELEXA Take 20 mg by mouth daily.   clopidogrel 75 MG tablet Commonly known as: PLAVIX Take 75 mg by mouth daily.   dorzolamide-timolol 22.3-6.8 MG/ML ophthalmic  solution Commonly known as: COSOPT Place 1 drop into both eyes 2 (two) times daily.   levothyroxine 75 MCG tablet Commonly known as: SYNTHROID Take 1 tablet (75 mcg total) by mouth daily before breakfast.   lisinopril-hydrochlorothiazide 20-25 MG tablet Commonly known as: ZESTORETIC Take 1 tablet by mouth daily.   metFORMIN 500 MG tablet Commonly known as: GLUCOPHAGE Take 500 mg by mouth daily with breakfast.   multivitamin with minerals Tabs tablet Take 1 tablet by mouth daily.   rosuvastatin 20 MG tablet Commonly known as: CRESTOR Take 1 tablet (20 mg total) by mouth daily.   traZODone 50 MG tablet Commonly known as: DESYREL Take 50 mg by mouth at bedtime.        Allergies: No Known Allergies  Family History: No family history on file.  Social History:  reports that he quit smoking about 38 years ago. His smoking use included cigarettes. He has never used smokeless tobacco. He reports that he does not currently use alcohol. 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   Physical Exam: BP (!) 191/107   Pulse 65   Ht 5\' 8"  (1.727 m)   Wt 158 lb (71.7 kg)   BMI 24.02 kg/m   Constitutional:  Alert and oriented, No acute distress. HEENT: [place] AT, moist mucus membranes.  Trachea midline, no masses. Cardiovascular: No clubbing, cyanosis, or edema. Skin: No rashes, bruises or suspicious lesions. Neurologic: Grossly intact, no focal deficits, moving all 4 extremities. Psychiatric: Normal mood and affect.  Laboratory Data: Lab Results  Component Value Date   WBC 6.6 02/05/2021   HGB 14.0 02/05/2021   HCT 42.4 02/05/2021   MCV 89.1 02/05/2021   PLT 161.0 02/05/2021    Lab Results  Component Value Date   CREATININE 1.12 02/05/2021    Lab Results  Component Value Date   PSA 4.39 07/02/2019    Lab Results  Component Value Date   HGBA1C 5.9 02/05/2021    Urinalysis Pending, unable to void today thus we  will have him drop off a urine tomorrow  Pertinent Imaging: Results for orders placed or performed in visit on 04/14/21  Bladder Scan (Post Void Residual) in office  Result Value Ref Range   Scan Result 71     Assessment & Plan:    1. Benign prostatic hyperplasia with urinary retention Significant improvement in his obstructive urinary symptoms with adequate emptying today  No longer on  any BPH medications including off of Flomax which is reassuring  He is having some symptoms outlined below, will address - Bladder Scan (Post Void Residual) in office - Urinalysis, Complete; Future  2. Perineal pain Suspect this is normal postoperative discomfort although will check urinalysis to rule out infection  There is a chance that he has some residual debris and if his symptoms fail to improve/resolve, would consider cystoscopy to rule out underlying retained fragment  We will have him follow-up more closely, IPSS PVR again in 6 weeks and if he feels improved at that point, would recommend cystoscopy  3. Elevated PSA We will plan to recheck PSA at the 35-month follow-up    Return in about 6 weeks (around 05/26/2021) for IPSS, PVR.  Vanna Scotland, MD  [facility] [address] [place], [place] [address] [phone]

## 2021-04-15 ENCOUNTER — Other Ambulatory Visit: Payer: Medicare Other

## 2021-04-15 DIAGNOSIS — N401 Enlarged prostate with lower urinary tract symptoms: Secondary | ICD-10-CM

## 2021-04-20 LAB — CULTURE, URINE COMPREHENSIVE

## 2021-04-22 ENCOUNTER — Telehealth: Payer: Self-pay | Admitting: *Deleted

## 2021-04-22 LAB — URINALYSIS, COMPLETE
Bilirubin, UA: NEGATIVE
Glucose, UA: NEGATIVE
Ketones, UA: NEGATIVE
Nitrite, UA: NEGATIVE
Specific Gravity, UA: 1.02 (ref 1.005–1.030)
Urobilinogen, Ur: 0.2 mg/dL (ref 0.2–1.0)
pH, UA: 5.5 (ref 5.0–7.5)

## 2021-04-22 LAB — MICROSCOPIC EXAMINATION
Epithelial Cells (non renal): NONE SEEN /hpf (ref 0–10)
WBC, UA: >30 /hpf — AB (ref 0–5)

## 2021-04-22 MED ORDER — SULFAMETHOXAZOLE-TRIMETHOPRIM 800-160 MG PO TABS: 1.0000 | ORAL_TABLET | Freq: Two times a day (BID) | ORAL | 0 refills | Status: DC

## 2021-04-22 NOTE — Telephone Encounter (Addendum)
Patient informed, RX sent in to [facility] as requested  ----- Message from Vanna Scotland, MD sent at 04/21/2021  9:18 AM EDT ----- Mr. Christopher Arroyo ended up growing a fairly low colony count of Enterobacter.  In light of his recent surgery, I do think we should go ahead and treat this.  Please prescribe Bactrim DS twice daily for 7 days.  Vanna Scotland, MD

## 2021-04-29 ENCOUNTER — Telehealth: Payer: Self-pay

## 2021-04-29 NOTE — Progress Notes (Signed)
Subjective:    Patient ID: Christopher Arroyo, male    DOB: 03-22-1946, 75 y.o.   MRN: [id number]  This visit occurred during the SARS-CoV-2 public health emergency.  Safety protocols were in place, including screening questions prior to the visit, additional usage of staff PPE, and extensive cleaning of exam room while observing appropriate contact time as indicated for disinfecting solutions.    HPI The patient is here for an acute visit.  Blood blotches .bruising on left side of rib cage - 2 days he noticed it - saw it in the mirror.  Sensitvie when he touches it.  He denies any trauma.  No SOB, cough, wheeze or fever.    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     Has been on abx - bacrim - uti  - has a couple of days left - symptoms improved [facility] - LBP - mm in nature on methocarbamol.  He also has notice a little swelling in his b/l buttock regions - that has been going on for a while.  No genital swelling.      Medications and allergies reviewed with patient and updated if appropriate.  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 Swallow whole.     citalopram (CELEXA) 20 MG tablet Take 20 mg by mouth daily.     dorzolamide-timolol (COSOPT) 22.3-6.8 MG/ML ophthalmic solution Place 1 drop into both eyes 2 (two) times daily.     levothyroxine (SYNTHROID) 75 MCG tablet Take 1 tablet (75 mcg total) by mouth daily before breakfast. 90 tablet 1   lisinopril-hydrochlorothiazide (ZESTORETIC) 20-25 MG tablet Take 1 tablet by mouth daily.     metFORMIN (GLUCOPHAGE) 500 MG tablet Take 500 mg by mouth daily with breakfast.     methocarbamol (ROBAXIN) 500 MG tablet Take 500 mg by mouth 2 (two) times daily as needed.     Multiple Vitamin (MULTIVITAMIN WITH MINERALS) TABS tablet Take 1 tablet by mouth daily.     rosuvastatin (CRESTOR) 20 MG tablet Take 1 tablet (20 mg total) by mouth daily. 90 tablet 1   sulfamethoxazole-trimethoprim (BACTRIM DS) 800-160 MG tablet Take 1 tablet by mouth 2 (two) times daily. 14 tablet 0   traZODone (DESYREL) 50 MG tablet Take 50 mg by mouth at bedtime.     No current facility-administered medications on file prior to visit.    Past Medical History:  Diagnosis Date   Anxiety and depression    Current use of long term anticoagulation    DAPT (ASA + clopidogrel)   Diabetes mellitus without complication (HCC)    History of  hiatal hernia    HLD (hyperlipidemia)    Hypertension    Hypothyroidism    PSA elevation    Right internal carotid artery aneurysm    s/p pipeline embolization on 08/08/2020   T2DM (type 2 diabetes mellitus) ([place]) 09/02/2020   TIA (transient ischemic attack) 2009   left hand weakness, decrease fine motor skills    Past Surgical History:  Procedure Laterality Date   CAROTID ANGIOGRAM Right 02/20/2021   CEREBRAL ANGIOGRAM N/A 04/24/2020   Location: [facility]   SP EMBOLIZATION INTRACRANIAL Right 08/08/2020   4.75 x 14 mm pipeline embolization device (PED) placement to RIGHT ICA cavernous segment aneurysm; Location: [facility]; Surgeon:  Ara Kussmaul, MD   TONSILLECTOMY      Social History   Socioeconomic History   Marital status: Married    Spouse name: Not on file   Number of children: Not on file   Years of education: Not on file   Highest education level: Not on file  Occupational History   Not on file  Tobacco Use   Smoking status: Former    Types: Cigarettes    Quit date: 70    Years since quitting: 38.8   Smokeless tobacco: Never  Vaping Use   Vaping Use: Never used  Substance and Sexual Activity   Alcohol use: Not Currently   Drug use: Not Currently    Types: Heroin    Comment: 1972 quit   Sexual activity: Not on file  Other Topics Concern   Not on file  Social History Narrative   Not on file   Social Determinants of Health   Financial Resource Strain: Not on file  Food Insecurity: Not on file  Transportation Needs: Not on file  Physical Activity: Not on file  Stress: Not on file  Social Connections: Not on file    No family history on file.  Review of Systems  Constitutional:  Positive for chills (3-4 nights ago). Negative for fever.  Respiratory:  Negative for cough, chest tightness, shortness of breath and wheezing.   Cardiovascular:  Negative for chest pain, palpitations and leg swelling.  Gastrointestinal:  Negative for abdominal pain and nausea.  Genitourinary:  Positive for frequency. Negative for dysuria and hematuria.  Neurological:  Negative for dizziness, light-headedness and headaches.      Objective:   Vitals:   04/30/21 1056  BP: 140/80  Pulse: 73  Temp: 98.1 F (36.7 C)  SpO2: 99%   BP Readings from Last 3 Encounters:  04/30/21 140/80  04/14/21 (!) 191/107  03/04/21 (!) 165/93   Wt Readings from Last 3 Encounters:  04/30/21 155 lb (70.3 kg)  04/14/21 158 lb (71.7 kg)  03/04/21 158 lb (71.7 kg)   Body mass index is 23.57 kg/m.   Physical Exam    Constitutional: Appears well-developed and well-nourished. No distress.  Head: Normocephalic and  atraumatic.  Neck: Neck supple. No tracheal deviation present. No thyromegaly present.  No cervical lymphadenopathy Cardiovascular: Normal rate, regular rhythm and normal heart sounds.  No murmur heard. No carotid bruit .  No edema Pulmonary/Chest: bruising on lateral left rib cage - two dark red bruises with surrounding faint yellow/blue colors, mild hematoma under darker areas, no swelling, mild tenderness with palpation.  No fluctuance.  No deformity.  Effort normal and breath sounds normal. No respiratory distress. No has no wheezes. No rales.  Abdomen: soft, NT, ND Skin: Skin is warm and dry. Not diaphoretic.        Assessment & Plan:  Bruising left lateral rib cage: Acute He noticed it two days ago - denies any trauma - even mild ? Cause On ASA but has been off of plavix for over two months No respiratory symptoms No other bruising Will check rib/chest xray Check cbc, cmp, pt-inr, esr   Overall he feels ok except for mild symptoms from his other medical problems   Will determine if further evaluation is needed after above tests.

## 2021-04-30 ENCOUNTER — Ambulatory Visit (INDEPENDENT_AMBULATORY_CARE_PROVIDER_SITE_OTHER): Payer: Medicare Other

## 2021-04-30 ENCOUNTER — Encounter: Payer: Self-pay | Admitting: Internal Medicine

## 2021-04-30 ENCOUNTER — Ambulatory Visit (INDEPENDENT_AMBULATORY_CARE_PROVIDER_SITE_OTHER): Payer: Medicare Other | Admitting: Internal Medicine

## 2021-04-30 ENCOUNTER — Other Ambulatory Visit: Payer: Self-pay

## 2021-04-30 VITALS — BP 140/80 | HR 73 | Temp 98.1°F | Ht 68.0 in | Wt 155.0 lb

## 2021-04-30 DIAGNOSIS — T148XXA Other injury of unspecified body region, initial encounter: Secondary | ICD-10-CM

## 2021-04-30 DIAGNOSIS — S299XXA Unspecified injury of thorax, initial encounter: Secondary | ICD-10-CM | POA: Diagnosis not present

## 2021-04-30 LAB — CBC WITH DIFFERENTIAL/PLATELET
Basophils Absolute: 0 10*3/uL (ref 0.0–0.1)
Basophils Relative: 0.5 % (ref 0.0–3.0)
Eosinophils Absolute: 0.1 10*3/uL (ref 0.0–0.7)
Eosinophils Relative: 1.5 % (ref 0.0–5.0)
HCT: 44.2 % (ref 39.0–52.0)
Hemoglobin: 14.6 g/dL (ref 13.0–17.0)
Lymphocytes Relative: 21 % (ref 12.0–46.0)
Lymphs Abs: 1.2 10*3/uL (ref 0.7–4.0)
MCHC: 33.1 g/dL (ref 30.0–36.0)
MCV: 87.3 fl (ref 78.0–100.0)
Monocytes Absolute: 0.4 10*3/uL (ref 0.1–1.0)
Monocytes Relative: 7.1 % (ref 3.0–12.0)
Neutro Abs: 4 10*3/uL (ref 1.4–7.7)
Neutrophils Relative %: 69.9 % (ref 43.0–77.0)
Platelets: 216 10*3/uL (ref 150.0–400.0)
RBC: 5.07 Mil/uL (ref 4.22–5.81)
RDW: 15 % (ref 11.5–15.5)
WBC: 5.7 10*3/uL (ref 4.0–10.5)

## 2021-04-30 LAB — COMPREHENSIVE METABOLIC PANEL
ALT: 19 U/L (ref 0–53)
AST: 29 U/L (ref 0–37)
Albumin: 5.1 g/dL (ref 3.5–5.2)
Alkaline Phosphatase: 44 U/L (ref 39–117)
BUN: 15 mg/dL (ref 6–23)
CO2: 29 mEq/L (ref 19–32)
Calcium: 9.6 mg/dL (ref 8.4–10.5)
Chloride: 97 mEq/L (ref 96–112)
Creatinine, Ser: 1.34 mg/dL (ref 0.40–1.50)
GFR: 52.8 mL/min — ABNORMAL LOW (ref >60.00)
Glucose, Bld: 89 mg/dL (ref 70–99)
Potassium: 3.9 mEq/L (ref 3.5–5.1)
Sodium: 133 mEq/L — ABNORMAL LOW (ref 135–145)
Total Bilirubin: 0.7 mg/dL (ref 0.2–1.2)
Total Protein: 8.9 g/dL — ABNORMAL HIGH (ref 6.0–8.3)

## 2021-04-30 LAB — SEDIMENTATION RATE: Sed Rate: 37 mm/hr — ABNORMAL HIGH (ref 0–20)

## 2021-04-30 LAB — PROTIME-INR
INR: 1 ratio (ref 0.8–1.0)
Prothrombin Time: 11.1 s (ref 9.6–13.1)

## 2021-04-30 NOTE — Telephone Encounter (Signed)
Patient called stating that he has "two red circle bruise like areas" on his left flank/back area. Patient states he has had no injury/fall to the area and is tender to the touch. The spots are not raised and do not appear rash like and do not itch. Patient wonders if this could be a possible side effect from the abx currently taking for UTI or from his COVID & Flu vaccine he received a week ago. It was explained that this would not be a typical reaction from abx and reactions typically occur with in 24-72 hours after starting medications. He denies fever, chills and does not have any urinary symptoms. Patient was advised to check in with PCP for further evaluation and follow up. Patient verbalized understanding

## 2021-04-30 NOTE — Patient Instructions (Signed)
   Have an xray and Blood work today.    Medications changes include :   none

## 2021-05-01 ENCOUNTER — Encounter: Payer: Self-pay | Admitting: Internal Medicine

## 2021-05-13 ENCOUNTER — Other Ambulatory Visit: Payer: Self-pay

## 2021-05-13 ENCOUNTER — Ambulatory Visit (INDEPENDENT_AMBULATORY_CARE_PROVIDER_SITE_OTHER): Payer: Medicare Other | Admitting: Internal Medicine

## 2021-05-13 ENCOUNTER — Encounter: Payer: Self-pay | Admitting: Internal Medicine

## 2021-05-13 ENCOUNTER — Ambulatory Visit (INDEPENDENT_AMBULATORY_CARE_PROVIDER_SITE_OTHER): Payer: Medicare Other

## 2021-05-13 VITALS — BP 160/104 | HR 64 | Temp 97.8°F | Resp 16 | Ht 68.0 in | Wt 159.0 lb

## 2021-05-13 DIAGNOSIS — M5441 Lumbago with sciatica, right side: Secondary | ICD-10-CM

## 2021-05-13 DIAGNOSIS — G8929 Other chronic pain: Secondary | ICD-10-CM

## 2021-05-13 DIAGNOSIS — Z125 Encounter for screening for malignant neoplasm of prostate: Secondary | ICD-10-CM | POA: Insufficient documentation

## 2021-05-13 DIAGNOSIS — E039 Hypothyroidism, unspecified: Secondary | ICD-10-CM | POA: Diagnosis not present

## 2021-05-13 DIAGNOSIS — I119 Hypertensive heart disease without heart failure: Secondary | ICD-10-CM

## 2021-05-13 DIAGNOSIS — R3121 Asymptomatic microscopic hematuria: Secondary | ICD-10-CM

## 2021-05-13 DIAGNOSIS — M5442 Lumbago with sciatica, left side: Secondary | ICD-10-CM | POA: Diagnosis not present

## 2021-05-13 DIAGNOSIS — I10 Essential (primary) hypertension: Secondary | ICD-10-CM

## 2021-05-13 LAB — BASIC METABOLIC PANEL
BUN: 10 mg/dL (ref 6–23)
CO2: 30 mEq/L (ref 19–32)
Calcium: 9.3 mg/dL (ref 8.4–10.5)
Chloride: 100 mEq/L (ref 96–112)
Creatinine, Ser: 1.01 mg/dL (ref 0.40–1.50)
GFR: 74.11 mL/min (ref >60.00)
Glucose, Bld: 103 mg/dL — ABNORMAL HIGH (ref 70–99)
Potassium: 4 mEq/L (ref 3.5–5.1)
Sodium: 136 mEq/L (ref 135–145)

## 2021-05-13 LAB — TSH: TSH: 4.6 u[IU]/mL (ref 0.35–5.50)

## 2021-05-13 LAB — PSA: PSA: 0.18 ng/mL (ref 0.10–4.00)

## 2021-05-13 NOTE — Patient Instructions (Signed)

## 2021-05-13 NOTE — Progress Notes (Signed)
Subjective:  Patient ID: Christopher Arroyo, male    DOB: 1946-01-17  Age: 75 y.o. MRN: [id number]  CC: Hypertension and Back Pain  This visit occurred during the SARS-CoV-2 public health emergency.  Safety protocols were in place, including screening questions prior to the visit, additional usage of staff PPE, and extensive cleaning of exam room while observing appropriate contact time as indicated for disinfecting solutions.    HPI Christopher Arroyo presents for f/up -  He complains of a 4 month hx of LBP that radiates into both glutes. He feels like there are "knots" in the area. He has gotten some symptom relief with muscle relaxants. His BP has been high (145/100,152/110) so he has been doubling the dose of the ACEI/thiazide diuretic.  Outpatient Medications Prior to Visit  Medication Sig Dispense Refill   aspirin EC 81 MG tablet Take 81 mg by mouth daily. Swallow whole.     citalopram (CELEXA) 20 MG tablet Take 20 mg by mouth daily.     dorzolamide-timolol (COSOPT) 22.3-6.8 MG/ML ophthalmic solution Place 1 drop into both eyes 2 (two) times daily.     levothyroxine (SYNTHROID) 75 MCG tablet Take 1 tablet (75 mcg total) by mouth daily before breakfast. 90 tablet 1   methocarbamol (ROBAXIN) 500 MG tablet Take 500 mg by mouth 2 (two) times daily as needed.     Multiple Vitamin (MULTIVITAMIN WITH MINERALS) TABS tablet Take 1 tablet by mouth daily.     rosuvastatin (CRESTOR) 20 MG tablet Take 1 tablet (20 mg total) by mouth daily. 90 tablet 1   traZODone (DESYREL) 50 MG tablet Take 50 mg by mouth at bedtime.     lisinopril-hydrochlorothiazide (ZESTORETIC) 20-25 MG tablet Take 1 tablet by mouth daily.     metFORMIN (GLUCOPHAGE) 500 MG tablet Take 500 mg by mouth daily with breakfast.     sulfamethoxazole-trimethoprim (BACTRIM DS) 800-160 MG tablet Take 1 tablet by mouth 2 (two) times daily. 14 tablet 0   No facility-administered medications prior to visit.    ROS Review of Systems   Constitutional:  Negative for appetite change, chills, diaphoresis, fatigue and fever.  HENT: Negative.  Negative for sore throat.   Eyes: Negative.   Respiratory:  Negative for cough, chest tightness, shortness of breath and wheezing.   Cardiovascular:  Negative for chest pain, palpitations and leg swelling.  Gastrointestinal:  Negative for abdominal pain, constipation, diarrhea, nausea and vomiting.  Endocrine: Negative.   Genitourinary: Negative.  Negative for difficulty urinating, dysuria, hematuria, scrotal swelling and urgency.  Musculoskeletal:  Positive for back pain. Negative for myalgias and neck pain.  Hematological:  Negative for adenopathy. Does not bruise/bleed easily.  Psychiatric/Behavioral: Negative.     Objective:  BP (!) 160/104 (BP Location: Right Arm, Patient Position: Sitting, Cuff Size: Large)   Pulse 64   Temp 97.8 F (36.6 C) (Oral)   Resp 16   Ht 5\' 8"  (1.727 m)   Wt 159 lb (72.1 kg)   SpO2 99%   BMI 24.18 kg/m   BP Readings from Last 3 Encounters:  05/13/21 (!) 160/104  04/30/21 140/80  04/14/21 (!) 191/107    Wt Readings from Last 3 Encounters:  05/13/21 159 lb (72.1 kg)  04/30/21 155 lb (70.3 kg)  04/14/21 158 lb (71.7 kg)    Physical Exam Vitals reviewed.  Constitutional:      Appearance: Normal appearance.  HENT:     Nose: Nose normal.     Mouth/Throat:     Mouth: Mucous  membranes are moist.  Eyes:     General: No scleral icterus.    Conjunctiva/sclera: Conjunctivae normal.  Cardiovascular:     Rate and Rhythm: Normal rate and regular rhythm.     Heart sounds: No murmur heard. Pulmonary:     Effort: Pulmonary effort is normal.     Breath sounds: No stridor. No wheezing, rhonchi or rales.  Abdominal:     General: Abdomen is flat.     Palpations: There is no mass.     Tenderness: There is no abdominal tenderness. There is no guarding.     Hernia: No hernia is present.  Musculoskeletal:        General: Normal range of motion.      Cervical back: Normal and neck supple.     Thoracic back: Normal.     Lumbar back: Normal. No tenderness or bony tenderness. Normal range of motion. Negative right straight leg raise test and negative left straight leg raise test.     Right lower leg: No edema.     Left lower leg: No edema.  Lymphadenopathy:     Cervical: No cervical adenopathy.  Skin:    General: Skin is warm and dry.     Findings: No rash.  Neurological:     General: No focal deficit present.     Mental Status: He is alert.  Psychiatric:        Mood and Affect: Mood normal.        Behavior: Behavior normal.    Lab Results  Component Value Date   WBC 5.7 04/30/2021   HGB 14.6 04/30/2021   HCT 44.2 04/30/2021   PLT 216.0 04/30/2021   GLUCOSE 103 (H) 05/13/2021   CHOL 228 (H) 02/05/2021   TRIG 155.0 (H) 02/05/2021   HDL 65.00 02/05/2021   LDLCALC 132 (H) 02/05/2021   ALT 19 04/30/2021   AST 29 04/30/2021   NA 136 05/13/2021   K 4.0 05/13/2021   CL 100 05/13/2021   CREATININE 1.01 05/13/2021   BUN 10 05/13/2021   CO2 30 05/13/2021   TSH 4.60 05/13/2021   PSA 0.18 05/13/2021   INR 1.0 04/30/2021   HGBA1C 5.9 02/05/2021    DG Lumbar Spine Complete  Result Date: 05/15/2021 CLINICAL DATA:  Continuous low back pain for several months EXAM: LUMBAR SPINE - COMPLETE 4+ VIEW COMPARISON:  None. FINDINGS: Frontal, bilateral oblique, lateral views of the lumbar spine are obtained. There are 5 non-rib-bearing lumbar type vertebral bodies in anatomic alignment. No acute displaced fractures. There is multilevel spondylosis and facet hypertrophy greatest at L4-5 and L5-S1. Sacroiliac joints are normal. Mild degenerative changes of the bilateral hips. IMPRESSION: 1. Multilevel spondylosis and facet hypertrophy, greatest in the lower lumbar spine. 2. No acute bony abnormality. Electronically Signed   By: Randa Ngo M.D.   On: 05/15/2021 12:16     Assessment & Plan:   Christopher Arroyo was seen today for hypertension and back  pain.  Diagnoses and all orders for this visit:  Primary hypertension- His blood pressure is not adequately well controlled.  I will check labs to screen for secondary causes and endorgan damage.  I do not think he is benefiting much from the ABC inhibitor so will discontinue it.  I have also asked him to upgrade to a more potent thiazide diuretic and to add a vasodilator and a CCB. -     Aldosterone + renin activity w/ ratio; Future -     Urinalysis, Routine w reflex  microscopic; Future -     Basic metabolic panel; Future -     TSH; Future -     Sodium, urine, random; Future -     Sodium, urine, random -     TSH -     Basic metabolic panel -     Urinalysis, Routine w reflex microscopic -     Aldosterone + renin activity w/ ratio -     amLODipine (NORVASC) 5 MG tablet; Take 1 tablet (5 mg total) by mouth daily. -     hydrALAZINE (APRESOLINE) 25 MG tablet; Take 1 tablet (25 mg total) by mouth 3 (three) times daily. -     indapamide (LOZOL) 1.25 MG tablet; Take 1 tablet (1.25 mg total) by mouth daily.  Chronic bilateral low back pain with bilateral sciatica- His plain films show DDD.  Will continue the muscle relaxers. -     DG Lumbar Spine Complete; Future  Acquired hypothyroidism- His TSH is in the normal range.  He will stay on the current dose of levothyroxine. -     TSH; Future -     TSH  Screening for prostate cancer -     PSA; Future -     PSA  LVH (left ventricular hypertrophy) due to hypertensive disease, without heart failure -     amLODipine (NORVASC) 5 MG tablet; Take 1 tablet (5 mg total) by mouth daily. -     hydrALAZINE (APRESOLINE) 25 MG tablet; Take 1 tablet (25 mg total) by mouth 3 (three) times daily. -     indapamide (LOZOL) 1.25 MG tablet; Take 1 tablet (1.25 mg total) by mouth daily.  Asymptomatic microscopic hematuria- I have asked him to undergo a CT (renal protocol) to screen for renal cell carcinoma, nephrolithiasis, or renal cysts. -     CT RENAL STONE  STUDY; Future  I have discontinued Christopher Arroyo's lisinopril-hydrochlorothiazide, metFORMIN, and sulfamethoxazole-trimethoprim. I am also having him start on amLODipine, hydrALAZINE, and indapamide. Additionally, I am having him maintain his citalopram, traZODone, aspirin EC, dorzolamide-timolol, rosuvastatin, levothyroxine, multivitamin with minerals, and methocarbamol.  Meds ordered this encounter  Medications   amLODipine (NORVASC) 5 MG tablet    Sig: Take 1 tablet (5 mg total) by mouth daily.    Dispense:  90 tablet    Refill:  0   hydrALAZINE (APRESOLINE) 25 MG tablet    Sig: Take 1 tablet (25 mg total) by mouth 3 (three) times daily.    Dispense:  270 tablet    Refill:  0   indapamide (LOZOL) 1.25 MG tablet    Sig: Take 1 tablet (1.25 mg total) by mouth daily.    Dispense:  90 tablet    Refill:  0      Follow-up: Return in about 4 weeks (around 06/10/2021).  Sanda Linger, MD

## 2021-05-14 DIAGNOSIS — R3121 Asymptomatic microscopic hematuria: Secondary | ICD-10-CM | POA: Insufficient documentation

## 2021-05-14 LAB — URINALYSIS, ROUTINE W REFLEX MICROSCOPIC
Bilirubin Urine: NEGATIVE
Ketones, ur: NEGATIVE
Leukocytes,Ua: NEGATIVE
Nitrite: NEGATIVE
Specific Gravity, Urine: <=1.005 — AB (ref 1.000–1.030)
Total Protein, Urine: NEGATIVE
Urine Glucose: NEGATIVE
Urobilinogen, UA: 0.2 (ref 0.0–1.0)
pH: 6 (ref 5.0–8.0)

## 2021-05-14 MED ORDER — HYDRALAZINE HCL 25 MG PO TABS: 25.0000 mg | ORAL_TABLET | Freq: Three times a day (TID) | ORAL | 0 refills | Status: DC

## 2021-05-14 MED ORDER — INDAPAMIDE 1.25 MG PO TABS: 1.2500 mg | ORAL_TABLET | Freq: Every day | ORAL | 0 refills | Status: DC

## 2021-05-14 MED ORDER — AMLODIPINE BESYLATE 5 MG PO TABS: 5.0000 mg | ORAL_TABLET | Freq: Every day | ORAL | 0 refills | Status: DC

## 2021-05-18 ENCOUNTER — Telehealth: Payer: Self-pay | Admitting: Internal Medicine

## 2021-05-18 NOTE — Telephone Encounter (Signed)
Pt scheduled for 11/15 @ 2.20pm

## 2021-05-18 NOTE — Telephone Encounter (Signed)
Patient calling in  Patient says at last visit provider discontinued lisinopril & had him start taking 3 new meds:  indapamide (LOZOL) 1.25 MG tablet amLODipine (NORVASC) 5 MG tablet hydrALAZINE (APRESOLINE) 25 MG tablet   Patient says since he has started taking these new meds he is having chest pains, feeling extremely tired & lethargic, & also feeling dizzy  Patient says he believes the dosage of these meds are too high & that is why he is getting sick & wants to stop taking these meds  Please call patient [phone]

## 2021-05-19 ENCOUNTER — Encounter: Payer: Self-pay | Admitting: Internal Medicine

## 2021-05-19 ENCOUNTER — Other Ambulatory Visit: Payer: Self-pay

## 2021-05-19 ENCOUNTER — Ambulatory Visit (INDEPENDENT_AMBULATORY_CARE_PROVIDER_SITE_OTHER): Payer: Medicare Other | Admitting: Internal Medicine

## 2021-05-19 VITALS — BP 142/90 | HR 73 | Temp 98.4°F | Resp 16 | Ht 68.0 in | Wt 154.0 lb

## 2021-05-19 DIAGNOSIS — I119 Hypertensive heart disease without heart failure: Secondary | ICD-10-CM | POA: Diagnosis not present

## 2021-05-19 DIAGNOSIS — I10 Essential (primary) hypertension: Secondary | ICD-10-CM | POA: Diagnosis not present

## 2021-05-19 LAB — ALDOSTERONE + RENIN ACTIVITY W/ RATIO
ALDO / PRA Ratio: 0.9 Ratio (ref 0.9–28.9)
Aldosterone: 4 ng/dL
Renin Activity: 4.66 ng/mL/h (ref 0.25–5.82)

## 2021-05-19 LAB — SODIUM, URINE, RANDOM: Sodium, Ur: 40 mmol/L (ref 28–272)

## 2021-05-19 MED ORDER — LISINOPRIL-HYDROCHLOROTHIAZIDE 20-25 MG PO TABS: 1.0000 | ORAL_TABLET | Freq: Every day | ORAL | 0 refills | Status: DC

## 2021-05-19 NOTE — Patient Instructions (Signed)

## 2021-05-19 NOTE — Progress Notes (Signed)
Subjective:  Patient ID: Christopher Arroyo, male    DOB: 1946-06-09  Age: 75 y.o. MRN: [id number]  CC: Hypertension  This visit occurred during the SARS-CoV-2 public health emergency.  Safety protocols were in place, including screening questions prior to the visit, additional usage of staff PPE, and extensive cleaning of exam room while observing appropriate contact time as indicated for disinfecting solutions.    HPI Christopher Arroyo presents for f/up -  He did not tolerate the combination of amlodipine, hydralazine, and indapamide.  He says it caused dizziness.  He is taking the ACE inhibitor and thiazide diuretic and says the dizziness has resolved and he thinks his blood pressure is well controlled.  Outpatient Medications Prior to Visit  Medication Sig Dispense Refill   amLODipine (NORVASC) 5 MG tablet Take 1 tablet (5 mg total) by mouth daily. 90 tablet 0   aspirin EC 81 MG tablet Take 81 mg by mouth daily. Swallow whole.     citalopram (CELEXA) 20 MG tablet Take 20 mg by mouth daily.     dorzolamide-timolol (COSOPT) 22.3-6.8 MG/ML ophthalmic solution Place 1 drop into both eyes 2 (two) times daily.     levothyroxine (SYNTHROID) 75 MCG tablet Take 1 tablet (75 mcg total) by mouth daily before breakfast. 90 tablet 1   methocarbamol (ROBAXIN) 500 MG tablet Take 500 mg by mouth 2 (two) times daily as needed.     Multiple Vitamin (MULTIVITAMIN WITH MINERALS) TABS tablet Take 1 tablet by mouth daily.     rosuvastatin (CRESTOR) 20 MG tablet Take 1 tablet (20 mg total) by mouth daily. 90 tablet 1   traZODone (DESYREL) 50 MG tablet Take 50 mg by mouth at bedtime.     hydrALAZINE (APRESOLINE) 25 MG tablet Take 1 tablet (25 mg total) by mouth 3 (three) times daily. 270 tablet 0   indapamide (LOZOL) 1.25 MG tablet Take 1 tablet (1.25 mg total) by mouth daily. 90 tablet 0   No facility-administered medications prior to visit.    ROS Review of Systems  Constitutional:  Negative for diaphoresis,  fatigue and unexpected weight change.  HENT: Negative.    Eyes: Negative.  Negative for visual disturbance.  Respiratory:  Negative for cough, shortness of breath and wheezing.   Cardiovascular:  Negative for chest pain, palpitations and leg swelling.  Gastrointestinal:  Negative for abdominal pain, diarrhea, nausea and vomiting.  Endocrine: Negative.   Genitourinary: Negative.  Negative for difficulty urinating and hematuria.  Musculoskeletal: Negative.  Negative for myalgias.  Skin: Negative.  Negative for pallor.  Neurological:  Negative for dizziness, weakness and light-headedness.  Hematological:  Negative for adenopathy. Does not bruise/bleed easily.  Psychiatric/Behavioral: Negative.     Objective:  BP (!) 142/90 (BP Location: Left Arm, Patient Position: Sitting, Cuff Size: Large)   Pulse 73   Temp 98.4 F (36.9 C) (Oral)   Resp 16   Ht 5\' 8"  (1.727 m)   Wt 154 lb (69.9 kg)   SpO2 98%   BMI 23.42 kg/m   BP Readings from Last 3 Encounters:  05/19/21 (!) 142/90  05/13/21 (!) 160/104  04/30/21 140/80    Wt Readings from Last 3 Encounters:  05/19/21 154 lb (69.9 kg)  05/13/21 159 lb (72.1 kg)  04/30/21 155 lb (70.3 kg)    Physical Exam Vitals reviewed.  HENT:     Nose: Nose normal.     Mouth/Throat:     Mouth: Mucous membranes are moist.  Eyes:     Conjunctiva/sclera:  Conjunctivae normal.  Cardiovascular:     Rate and Rhythm: Normal rate and regular rhythm.     Heart sounds: No murmur heard. Pulmonary:     Effort: Pulmonary effort is normal.     Breath sounds: No stridor. No wheezing, rhonchi or rales.  Abdominal:     General: Abdomen is flat. Bowel sounds are normal. There is no distension.     Palpations: Abdomen is soft. There is no hepatomegaly, splenomegaly or mass.     Tenderness: There is no abdominal tenderness.  Musculoskeletal:        General: Normal range of motion.     Cervical back: Neck supple.     Right lower leg: No edema.     Left lower  leg: No edema.  Lymphadenopathy:     Cervical: No cervical adenopathy.  Skin:    General: Skin is warm and dry.  Neurological:     General: No focal deficit present.     Mental Status: He is alert.  Psychiatric:        Mood and Affect: Mood normal.        Behavior: Behavior normal.    Lab Results  Component Value Date   WBC 5.7 04/30/2021   HGB 14.6 04/30/2021   HCT 44.2 04/30/2021   PLT 216.0 04/30/2021   GLUCOSE 103 (H) 05/13/2021   CHOL 228 (H) 02/05/2021   TRIG 155.0 (H) 02/05/2021   HDL 65.00 02/05/2021   LDLCALC 132 (H) 02/05/2021   ALT 19 04/30/2021   AST 29 04/30/2021   NA 136 05/13/2021   K 4.0 05/13/2021   CL 100 05/13/2021   CREATININE 1.01 05/13/2021   BUN 10 05/13/2021   CO2 30 05/13/2021   TSH 4.60 05/13/2021   PSA 0.18 05/13/2021   INR 1.0 04/30/2021   HGBA1C 5.9 02/05/2021    No results found.  Assessment & Plan:   Christopher Arroyo was seen today for hypertension.  Diagnoses and all orders for this visit:  Primary hypertension- He has not achieved his blood pressure goal of 130/80.  Will try to control his blood pressure with the combination of amlodipine, lisinopril, and hydrochlorothiazide. -     lisinopril-hydrochlorothiazide (ZESTORETIC) 20-25 MG tablet; Take 1 tablet by mouth daily.  LVH (left ventricular hypertrophy) due to hypertensive disease, without heart failure- There is no evidence of heart failure.  Will try to get better control of his blood pressure. -     lisinopril-hydrochlorothiazide (ZESTORETIC) 20-25 MG tablet; Take 1 tablet by mouth daily.  I have discontinued Christopher Arroyo's hydrALAZINE and indapamide. I am also having him maintain his citalopram, traZODone, aspirin EC, dorzolamide-timolol, rosuvastatin, levothyroxine, multivitamin with minerals, methocarbamol, amLODipine, and lisinopril-hydrochlorothiazide.  Meds ordered this encounter  Medications   lisinopril-hydrochlorothiazide (ZESTORETIC) 20-25 MG tablet    Sig: Take 1 tablet  by mouth daily.    Dispense:  90 tablet    Refill:  0     Follow-up: Return in about 3 months (around 08/19/2021).  Sanda Linger, MD

## 2021-05-20 ENCOUNTER — Inpatient Hospital Stay: Admission: RE | Admit: 2021-05-20 | Payer: Medicare Other | Source: Ambulatory Visit

## 2021-05-20 ENCOUNTER — Telehealth: Payer: Self-pay

## 2021-05-20 NOTE — Telephone Encounter (Signed)
Pt called stating he saw a new PCP yesterday who did a UA. The pcp found blood in urine and wanted pt to get CT scan today. Pt called to let us know he is canceling the appointment and would like Dr. Apolinar Junes to exam him and see if the CT scan is needed.

## 2021-05-25 NOTE — Progress Notes (Signed)
05/26/21 11:17 AM   Christopher Arroyo 10/08/1945 [id number]  Referring provider:  Janith Lima, MD [address] [place],  [place] [address] Chief Complaint  Patient presents with   Benign Prostatic Hypertrophy     HPI: Christopher Arroyo is a 75 y.o.male with a personal history of prostamegaly, BPH with outlet obstruction, urinary retention status post holep on 02/2021, and perineal pain who returns today for 6 week follow-up with IPSS, UA, and PVR.   His postoperative course was complicated by suprapubic pain.  He was seen by Christopher Arroyo and managed for presumptive UTI (culture negative ) as well as Urogesic blue.   Surgical pathology was benign, 53.5 g resection.  His most recent PSA as of 05/13/2021 was 0.18.   He reports today that he is doing better. He has been experiencing leakage after urination. He has a new PCP, who did a urine sample that revealed blood in his urine, his PCP suggested CT scan.  He reports that he is off all prostate medications.   He does mention that he had a urinalysis checked by his new primary care physician which showed some minor microscopic hematuria.  He reports that a noncontrast CT scan was ordered and recommended but he wanted the opinion of his urologist prior to pursuing this.   IPSS     Row Name 05/26/21 1100         International Prostate Symptom Score   How often have you had the sensation of not emptying your bladder? Less than 1 in 5     How often have you had to urinate less than every two hours? Less than 1 in 5 times     How often have you found you stopped and started again several times when you urinated? Not at All     How often have you found it difficult to postpone urination? More than half the time     How often have you had a weak urinary stream? Not at All     How often have you had to strain to start urination? Not at All     How many times did you typically get up at night to urinate? 2 Times     Total IPSS Score  8       Quality of Life due to urinary symptoms   If you were to spend the rest of your life with your urinary condition just the way it is now how would you feel about that? 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 on 08/08/2020   T2DM (type 2 diabetes mellitus) ([place]) 09/02/2020   TIA (transient ischemic attack) 2009   left hand weakness, decrease fine motor skills    Surgical History: Past Surgical History:  Procedure Laterality Date   CAROTID ANGIOGRAM Right 02/20/2021   CEREBRAL ANGIOGRAM N/A 04/24/2020   Location: [facility]   SP EMBOLIZATION INTRACRANIAL Right 08/08/2020   4.75 x 14 mm pipeline embolization device (PED) placement  to RIGHT ICA cavernous segment aneurysm; Location: [facility]; Surgeon: Christopher Kussmaul, MD   TONSILLECTOMY      Home Medications:  Allergies as of 05/26/2021   No Known Allergies      Medication List        Accurate as of May 26, 2021 11:17 AM. If you have any questions, ask your nurse or doctor.          amLODipine 5 MG tablet Commonly known as: NORVASC Take 1 tablet (5 mg total) by mouth daily.   aspirin EC 81 MG tablet Take 81 mg by mouth daily. Swallow whole.   citalopram 20 MG tablet Commonly known as: CELEXA Take 20 mg by mouth daily.   dorzolamide-timolol 22.3-6.8 MG/ML ophthalmic solution Commonly known as: COSOPT Place 1 drop into both eyes 2 (two) times daily.   levothyroxine 75 MCG tablet Commonly known as: SYNTHROID Take 1 tablet (75 mcg total) by mouth daily before breakfast.   lisinopril-hydrochlorothiazide 20-25 MG  tablet Commonly known as: ZESTORETIC Take 1 tablet by mouth daily.   methocarbamol 500 MG tablet Commonly known as: ROBAXIN Take 500 mg by mouth 2 (two) times daily as needed.   multivitamin with minerals Tabs tablet Take 1 tablet by mouth daily.   rosuvastatin 20 MG tablet Commonly known as: CRESTOR Take 1 tablet (20 mg total) by mouth daily.   traZODone 50 MG tablet Commonly known as: DESYREL Take 50 mg by mouth at bedtime.        Allergies: No Known Allergies  Family History: History reviewed. No pertinent family history.  Social History:  reports that he quit smoking about 38 years ago. His smoking use included cigarettes. He has never used smokeless tobacco. He reports that he does not currently use alcohol. 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   Physical Exam: BP (!) 150/105   Pulse 80   Ht 5\' 8"  (1.727 m)   Wt 150 lb (68 kg)   BMI 22.81 kg/m   Constitutional:  Alert and oriented, No acute distress. HEENT: [place] AT, moist mucus membranes.  Trachea midline, no masses. Cardiovascular: No clubbing, cyanosis, or edema. Respiratory: Normal respiratory effort, no increased work of breathing. Skin: No rashes, bruises or suspicious lesions. Neurologic: Grossly intact, no focal deficits, moving all 4 extremities. Psychiatric: Normal mood and affect.  Laboratory Data:  Lab Results  Component Value Date   CREATININE 1.01 05/13/2021    Lab Results  Component Value Date   PSA 0.18 05/13/2021   PSA 4.39 07/02/2019    Lab Results  Component Value Date   HGBA1C 5.9 02/05/2021    Urinalysis Negative, no microscopic blood  Pertinent Imaging: Results for orders placed or performed in visit on 05/26/21  BLADDER SCAN AMB NON-IMAGING  Result Value Ref Range   Scan Result 3 ml     Assessment & Plan:   BPH with urinary retention  - S/p HoLEP   - Symptoms improving   - Bladder Scan (Post Void Residual)  in office; adequate emptying today   - Urinalysis, Pending; Future  2. History of elevated PSA  - PSA within normal range   - Will plan to recheck PSA at the 6 month follow-up   3.  Microscopic hematuria -Urinalysis today is reassuring  -Given the proximity to the holep, would anticipate some microscopic heme.  His bladder was visualized at the time of surgery and noted to be free of any tumors.  Would hold off on microscopic hematuria at this time especially in light of negative urinalysis today.  We will recheck a UA in 6 months as well.  Return in 6 months for IPSS, PSA, PVR, and UA  I,Kailey Littlejohn,acting as a scribe for Vanna Scotland, MD.,have documented all relevant documentation on the behalf of Vanna Scotland, MD,as directed by  Vanna Scotland, MD while in the presence of Vanna Scotland, MD.  I have reviewed the above documentation for accuracy and completeness, and I agree with the above.   Vanna Scotland, MD   [facility] [address], [address] [place], [place] [address] [phone]  I spent 31 total minutes on the day of the encounter including pre-visit review of the medical record, face-to-face time with the patient, and post visit ordering of labs/imaging/tests.

## 2021-05-26 ENCOUNTER — Other Ambulatory Visit: Payer: Self-pay

## 2021-05-26 ENCOUNTER — Ambulatory Visit (INDEPENDENT_AMBULATORY_CARE_PROVIDER_SITE_OTHER): Payer: Medicare Other | Admitting: Urology

## 2021-05-26 ENCOUNTER — Encounter: Payer: Self-pay | Admitting: Urology

## 2021-05-26 VITALS — BP 150/105 | HR 80 | Ht 68.0 in | Wt 150.0 lb

## 2021-05-26 DIAGNOSIS — N401 Enlarged prostate with lower urinary tract symptoms: Secondary | ICD-10-CM | POA: Diagnosis not present

## 2021-05-26 DIAGNOSIS — R338 Other retention of urine: Secondary | ICD-10-CM | POA: Diagnosis not present

## 2021-05-26 LAB — URINALYSIS, COMPLETE
Bilirubin, UA: NEGATIVE
Glucose, UA: NEGATIVE
Ketones, UA: NEGATIVE
Leukocytes,UA: NEGATIVE
Nitrite, UA: NEGATIVE
Specific Gravity, UA: 1.015 (ref 1.005–1.030)
Urobilinogen, Ur: 1 mg/dL (ref 0.2–1.0)
pH, UA: 7.5 (ref 5.0–7.5)

## 2021-05-26 LAB — MICROSCOPIC EXAMINATION: Bacteria, UA: NONE SEEN

## 2021-05-26 LAB — BLADDER SCAN AMB NON-IMAGING: Scan Result: 3

## 2021-06-10 ENCOUNTER — Other Ambulatory Visit: Payer: Self-pay | Admitting: Internal Medicine

## 2021-06-10 ENCOUNTER — Ambulatory Visit: Payer: Medicare Other | Admitting: Emergency Medicine

## 2021-06-10 ENCOUNTER — Other Ambulatory Visit: Payer: Self-pay | Admitting: Urology

## 2021-06-10 DIAGNOSIS — R339 Retention of urine, unspecified: Secondary | ICD-10-CM

## 2021-06-10 DIAGNOSIS — I10 Essential (primary) hypertension: Secondary | ICD-10-CM

## 2021-06-10 DIAGNOSIS — I119 Hypertensive heart disease without heart failure: Secondary | ICD-10-CM

## 2021-06-10 MED ORDER — AMLODIPINE BESYLATE 5 MG PO TABS: 5.0000 mg | ORAL_TABLET | Freq: Every day | ORAL | 0 refills | Status: DC

## 2021-07-09 DIAGNOSIS — M25511 Pain in right shoulder: Secondary | ICD-10-CM | POA: Diagnosis not present

## 2021-07-13 ENCOUNTER — Telehealth: Payer: Self-pay

## 2021-07-13 NOTE — Telephone Encounter (Addendum)
Pt calling in requesting a referral for insurance purposes after [employer] told the pt that he would need a referral for insurance to cover.   Pt believes he is experiencing Radio broadcast assistant. 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 Wife is now in nursing facility but he is still responsible for his wife. Pt is requesting a referral for [facility] to be able to get the support that he needs.  Please contact pt with update. [phone]

## 2021-07-14 ENCOUNTER — Other Ambulatory Visit: Payer: Self-pay | Admitting: Internal Medicine

## 2021-07-14 DIAGNOSIS — R457 State of emotional shock and stress, unspecified: Secondary | ICD-10-CM

## 2021-07-14 NOTE — Telephone Encounter (Signed)
Pt has been informed that referral was entered.  

## 2021-07-20 DIAGNOSIS — M25511 Pain in right shoulder: Secondary | ICD-10-CM | POA: Diagnosis not present

## 2021-07-20 DIAGNOSIS — M75121 Complete rotator cuff tear or rupture of right shoulder, not specified as traumatic: Secondary | ICD-10-CM | POA: Diagnosis not present

## 2021-07-24 DIAGNOSIS — M75121 Complete rotator cuff tear or rupture of right shoulder, not specified as traumatic: Secondary | ICD-10-CM | POA: Diagnosis not present

## 2021-07-24 DIAGNOSIS — M25511 Pain in right shoulder: Secondary | ICD-10-CM | POA: Diagnosis not present

## 2021-07-27 DIAGNOSIS — M75121 Complete rotator cuff tear or rupture of right shoulder, not specified as traumatic: Secondary | ICD-10-CM | POA: Diagnosis not present

## 2021-07-27 DIAGNOSIS — M25511 Pain in right shoulder: Secondary | ICD-10-CM | POA: Diagnosis not present

## 2021-07-29 DIAGNOSIS — M25511 Pain in right shoulder: Secondary | ICD-10-CM | POA: Diagnosis not present

## 2021-07-29 DIAGNOSIS — M75121 Complete rotator cuff tear or rupture of right shoulder, not specified as traumatic: Secondary | ICD-10-CM | POA: Diagnosis not present

## 2021-08-03 DIAGNOSIS — M25511 Pain in right shoulder: Secondary | ICD-10-CM | POA: Diagnosis not present

## 2021-08-03 DIAGNOSIS — M75121 Complete rotator cuff tear or rupture of right shoulder, not specified as traumatic: Secondary | ICD-10-CM | POA: Diagnosis not present

## 2021-08-05 ENCOUNTER — Ambulatory Visit (INDEPENDENT_AMBULATORY_CARE_PROVIDER_SITE_OTHER): Payer: Medicare HMO | Admitting: Psychologist

## 2021-08-05 DIAGNOSIS — R69 Illness, unspecified: Secondary | ICD-10-CM | POA: Diagnosis not present

## 2021-08-05 DIAGNOSIS — F330 Major depressive disorder, recurrent, mild: Secondary | ICD-10-CM | POA: Diagnosis not present

## 2021-08-05 NOTE — Plan of Care (Signed)

## 2021-08-05 NOTE — Progress Notes (Signed)
[facility] Counselor Initial Adult Exam  Name: Christopher Arroyo Date: 08/05/2021 MRN: [id number] DOB: [birth year] PCP: Etta Grandchild, MD  Time spent: 2:00 pm to 2:32 pm; total time: 32 minutes  This session was held via video webex teletherapy due to the coronavirus risk at this time. The patient consented to video teletherapy and was located at his home during this session. He is aware it is the responsibility of the patient to secure confidentiality on his end of the session. The provider was in a private home office for the duration of this session. Limits of confidentiality were discussed with the patient.   Guardian/Payee:  NA    Paperwork requested: No   Reason for Visit /Presenting Problem: Depression secondary to being a caregiver.   Mental Status Exam: Appearance:   Well Groomed     Behavior:  Appropriate  Motor:  Normal  Speech/Language:   Clear and Coherent  Affect:  Appropriate  Mood:  normal  Thought process:  normal  Thought content:    WNL  Sensory/Perceptual disturbances:    WNL  Orientation:  oriented to person, place, and time/date  Attention:  Good  Concentration:  Good  Memory:  WNL  Fund of knowledge:   Good  Insight:    Good  Judgment:   Good  Impulse Control:  Good     Reported Symptoms:  The patient endorsed experiencing the following: feeling down, sad, fatigue, rumination of negative thoughts, low self-esteem, social isolation, thoughts of hopelessness and passive suicidal ideation. He denied having a plan or intent to act on a plan. He denied homicidal ideation.   Risk Assessment: Danger to Self:   Patient endorsed experiencing passive suicidal ideation. He denied having a plan or intent to act on a plan.  Self-injurious Behavior: No Danger to Others: No Duty to Warn:no Physical Aggression / Violence:No  Access to Firearms a concern: No  Gang Involvement:No  Patient / guardian was educated about steps to take if suicide or  homicide risk level increases between visits: no While future psychiatric events cannot be accurately predicted, the patient does not currently require acute inpatient psychiatric care and does not currently meet [facility] involuntary commitment criteria.  Substance Abuse History: Current substance abuse: No     Past Psychiatric History:   Previous psychological history is significant for depression and substance use Outpatient Providers:Patient stated he was enrolled in a program for substance use.  History of Psych Hospitalization: No  Psychological Testing:  NA    Abuse History:  Victim of: No.,  NA    Report needed: No. Victim of Neglect:No. Perpetrator of  NA   Witness / Exposure to Domestic Violence: No   Protective Services Involvement: No  Witness to [employer] Violence:  No   Family History: No family history on file.  Living situation: the patient lives alone  Sexual Orientation: Straight  Relationship Status: married  Name of spouse / other: [name] is his wife's name.  If a parent, number of children / ages:Patient stated that he has a son who is 28 years old.   Support Systems: friends  Financial Stress:  No   Income/Employment/Disability: Neurosurgeon: No   Educational History: Education: some college  Religion/Sprituality/World View: Patient identifies as Jehovah Witness  Any cultural differences that may affect / interfere with treatment:  not applicable   Recreation/Hobbies: Playing golf and socialization with friends  Stressors: Other: Being a caregiver to someone who has a chronic  illness    Strengths: Supportive Relationships, Friends, Hopefulness, Journalist, newspaper, and Able to Communicate Effectively  Barriers:  NA   Legal History: Pending legal issue / charges: The patient has no significant history of legal issues. History of legal issue / charges:  NA  Medical History/Surgical History: reviewed Past  Medical History:  Diagnosis Date   Anxiety and depression    Current use of long term anticoagulation    DAPT (ASA + clopidogrel)   Diabetes mellitus without complication (HCC)    History of hiatal hernia    HLD (hyperlipidemia)    Hypertension    Hypothyroidism    PSA elevation    Right internal carotid artery aneurysm    s/p pipeline embolization on 08/08/2020   T2DM (type 2 diabetes mellitus) (HCC) 09/02/2020   TIA (transient ischemic attack) 2009   left hand weakness, decrease fine motor skills    Past Surgical History:  Procedure Laterality Date   CAROTID ANGIOGRAM Right 02/20/2021   CEREBRAL ANGIOGRAM N/A 04/24/2020   Location: [facility]   SP EMBOLIZATION INTRACRANIAL Right 08/08/2020   4.75 x 14 mm pipeline embolization device (PED) placement to RIGHT ICA cavernous segment aneurysm; Location: [facility]; Surgeon: Johnsie Cancel, MD   TONSILLECTOMY      Medications: Current Outpatient Medications  Medication Sig Dispense Refill   amLODipine (NORVASC) 5 MG tablet Take 1 tablet (5 mg total) by mouth daily. 90 tablet 0   aspirin EC 81 MG tablet Take 81 mg by mouth daily. Swallow whole.     citalopram (CELEXA) 20 MG tablet Take 20 mg by mouth daily.     dorzolamide-timolol (COSOPT) 22.3-6.8 MG/ML ophthalmic solution Place 1 drop into both eyes 2 (two) times daily.     levothyroxine (SYNTHROID) 75 MCG tablet Take 1 tablet (75 mcg total) by mouth daily before breakfast. 90 tablet 1   lisinopril-hydrochlorothiazide (ZESTORETIC) 20-25 MG tablet Take 1 tablet by mouth daily. 90 tablet 0   methocarbamol (ROBAXIN) 500 MG tablet Take 500 mg by mouth 2 (two) times daily as needed.     Multiple Vitamin (MULTIVITAMIN WITH MINERALS) TABS tablet Take 1 tablet by mouth daily.     rosuvastatin (CRESTOR) 20 MG tablet Take 1 tablet (20 mg total) by mouth daily. 90 tablet 1   traZODone (DESYREL) 50 MG tablet Take 50 mg by mouth at bedtime.     No current facility-administered medications for this visit.     No Known Allergies  Diagnoses:  F33.0 major depressive affective disorder, recurrent, mild  Plan of Care: The patient is a 76 year old Black male who was referred due to experiencing depression secondary to being a caregiver. The patient lives alone. The patient meets criteria for a diagnosis of F33.0 major depressive affective disorder, recurrent, mild based off of the following: feeling down, sad, fatigue, rumination of negative thoughts, low self-esteem, social isolation, thoughts of hopelessness and passive suicidal ideation. He denied having a plan or intent to act on a plan. He denied homicidal ideation.   The patient stated that he wanted a place to process emotions and to feel supported.  This psychologist makes the recommendation that the patient participate in therapy at least once a month and if needed more regularly.   Hilbert Corrigan, PsyD

## 2021-08-05 NOTE — Progress Notes (Signed)
                Darcy Barbara, PsyD 

## 2021-08-19 ENCOUNTER — Other Ambulatory Visit: Payer: Self-pay

## 2021-08-19 ENCOUNTER — Ambulatory Visit (INDEPENDENT_AMBULATORY_CARE_PROVIDER_SITE_OTHER): Payer: Medicare HMO | Admitting: Emergency Medicine

## 2021-08-19 ENCOUNTER — Encounter: Payer: Self-pay | Admitting: Emergency Medicine

## 2021-08-19 ENCOUNTER — Telehealth: Payer: Self-pay

## 2021-08-19 VITALS — BP 148/86 | HR 68 | Ht 68.0 in | Wt 155.0 lb

## 2021-08-19 DIAGNOSIS — E785 Hyperlipidemia, unspecified: Secondary | ICD-10-CM | POA: Diagnosis not present

## 2021-08-19 DIAGNOSIS — Z7689 Persons encountering health services in other specified circumstances: Secondary | ICD-10-CM

## 2021-08-19 DIAGNOSIS — R457 State of emotional shock and stress, unspecified: Secondary | ICD-10-CM | POA: Diagnosis not present

## 2021-08-19 DIAGNOSIS — E039 Hypothyroidism, unspecified: Secondary | ICD-10-CM | POA: Diagnosis not present

## 2021-08-19 DIAGNOSIS — I10 Essential (primary) hypertension: Secondary | ICD-10-CM

## 2021-08-19 DIAGNOSIS — G4700 Insomnia, unspecified: Secondary | ICD-10-CM | POA: Diagnosis not present

## 2021-08-19 DIAGNOSIS — F4389 Other reactions to severe stress: Secondary | ICD-10-CM

## 2021-08-19 DIAGNOSIS — R69 Illness, unspecified: Secondary | ICD-10-CM | POA: Diagnosis not present

## 2021-08-19 MED ORDER — VALSARTAN-HYDROCHLOROTHIAZIDE 160-12.5 MG PO TABS: 1.0000 | ORAL_TABLET | Freq: Every day | ORAL | 3 refills | Status: DC

## 2021-08-19 MED ORDER — ZOLPIDEM TARTRATE 5 MG PO TABS: 5.0000 mg | ORAL_TABLET | Freq: Every evening | ORAL | 1 refills | Status: DC | PRN

## 2021-08-19 MED ORDER — VORTIOXETINE HBR 5 MG PO TABS: 5.0000 mg | ORAL_TABLET | Freq: Every day | ORAL | 5 refills | Status: AC

## 2021-08-19 NOTE — Telephone Encounter (Signed)
Pt is calling in stating that he and Dr. Mitchel Honour discuss starting Ambien and stopping Trazondone. Pt went to the pharmacy but Ambien wasn't sent to the pharmacy.  Pharmacy: [facility] #[id number] Lorina Rabon, [place] 08/19/21  Pt CB [phone]

## 2021-08-19 NOTE — Telephone Encounter (Signed)
He is correct.  I just sent the prescription for Ambien that we talked about.  Hope it helps.  Thanks.

## 2021-08-19 NOTE — Progress Notes (Signed)
Christopher Arroyo 76 y.o.   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 male here to establish care with me. Has to main problems he wants to discuss: 1.  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  Wife was admitted to the nursing home last year.  Left with a lot of responsibilities at home which are becoming overwhelming.  Feels depressed.  Not sleeping well. 2.  Needs hypertension management advice.  Blood pressure readings at home on the high side.  Feels medications are not working and he needs to change.    Depression        Associated symptoms include insomnia.  Associated symptoms include no headaches.   Prior to Admission medications   Medication Sig Start Date End Date Taking? Authorizing Provider  amLODipine (NORVASC) 5 MG tablet Take 1 tablet (5 mg total) by mouth daily. 06/10/21  Yes Janith Lima, MD  aspirin EC 81 MG tablet Take 81 mg by mouth daily. Swallow whole.   Yes [provider]  citalopram (CELEXA) 20 MG tablet Take 20 mg by mouth daily. 06/01/20  Yes [provider]  dorzolamide-timolol (COSOPT) 22.3-6.8 MG/ML ophthalmic solution Place 1 drop into both eyes 2 (two) times daily.   Yes [provider]  levothyroxine (SYNTHROID) 75 MCG tablet Take 1 tablet (75 mcg total) by mouth daily before breakfast. 02/06/21  Yes Janith Lima, MD  lisinopril-hydrochlorothiazide (ZESTORETIC) 20-25 MG tablet Take 1 tablet by mouth daily. 05/19/21  Yes Janith Lima, MD  Multiple Vitamin (MULTIVITAMIN WITH MINERALS) TABS tablet Take 1 tablet by mouth daily.   Yes [provider]  rosuvastatin (CRESTOR) 20 MG tablet Take 1 tablet (20 mg total) by mouth daily. 02/05/21  Yes Janith Lima, MD  traZODone (DESYREL) 50 MG tablet Take 50 mg by mouth at bedtime. 05/09/20  Yes [provider]  meloxicam (MOBIC) 7.5 MG tablet  meloxicam 7.5 mg tablet  TAKE 1 TABLET BY MOUTH EVERY DAY    [provider]  methocarbamol (ROBAXIN) 500 MG tablet Take 500 mg by mouth 2 (two) times daily as needed. Patient not taking: Reported on 08/19/2021 04/26/21   [provider]    No Known Allergies  Patient Active Problem List   Diagnosis Date Noted   Asymptomatic microscopic hematuria 05/14/2021   Chronic bilateral low back pain with bilateral sciatica 05/13/2021   Screening for prostate cancer 05/13/2021   LVH (left ventricular hypertrophy) due to hypertensive disease, without heart failure 05/13/2021   Encounter for general adult medical examination with abnormal findings 02/05/2021   Hypokalemia 02/05/2021   Type II diabetes mellitus with manifestations ([place]) 02/05/2021   Acquired hypothyroidism 02/05/2021   Hyperlipidemia with target LDL less than 70 02/05/2021   Headaches, cluster 11/24/2020   Hepatitis C 11/24/2020   Hepatitis B core antibody positive 11/24/2020   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   TIA (transient ischemic attack) 05/27/2020   Caregiver stress syndrome 08/17/2016   Vertigo, intermittent 10/31/2013   Anxiety 10/11/2011   Hypothyroidism (acquired) 10/11/2011   Insomnia 10/11/2011   Primary hypertension 10/11/2011    Past Medical History:  Diagnosis Date   Anxiety and depression    Current use of long term anticoagulation    DAPT (ASA + clopidogrel)   Diabetes mellitus without complication ([place])  History of hiatal hernia    HLD (hyperlipidemia)    Hypertension    Hypothyroidism    PSA elevation    Right internal carotid artery aneurysm    s/p pipeline embolization on 08/08/2020   T2DM (type 2 diabetes mellitus) ([facility]) 09/02/2020   TIA (transient ischemic attack) 2009   left hand weakness, decrease fine motor skills    Past Surgical History:  Procedure Laterality Date   CAROTID ANGIOGRAM Right 02/20/2021   CEREBRAL ANGIOGRAM N/A 04/24/2020    Location: [facility]   SP EMBOLIZATION INTRACRANIAL Right 08/08/2020   4.75 x 14 mm pipeline embolization device (PED) placement to RIGHT ICA cavernous segment aneurysm; Location: [facility]; Surgeon: Johnsie Cancel, MD   TONSILLECTOMY      Social History   Socioeconomic History   Marital status: Married    Spouse name: Not on file   Number of children: Not on file   Years of education: Not on file   Highest education level: Not on file  Occupational History   Not on file  Tobacco Use   Smoking status: Former    Types: Cigarettes    Quit date: 12    Years since quitting: 39.1   Smokeless tobacco: Never  Vaping Use   Vaping Use: Never used  Substance and Sexual Activity   Alcohol use: Not Currently   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   Review of Systems  Constitutional: Negative.  Negative for chills and fever.  HENT: Negative.  Negative for congestion and sore throat.   Respiratory: Negative.  Negative for cough and shortness of breath.   Cardiovascular: Negative.  Negative for chest pain and palpitations.  Gastrointestinal: Negative.  Negative for abdominal pain, diarrhea, nausea and vomiting.  Genitourinary: Negative.   Skin: Negative.  Negative for rash.  Neurological: Negative.  Negative for dizziness and headaches.  Psychiatric/Behavioral:  Positive for depression. The patient has insomnia.   All other systems reviewed and are negative.  Today's Vitals   08/19/21 1411  BP: (!) 148/86  Pulse: 68  SpO2: 98%  Weight: 155 lb (70.3 kg)  Height:  5\' 8"  (1.727 m)   Body mass index is 23.57 kg/m. Wt Readings from Last 3 Encounters:  08/19/21 155 lb (70.3 kg)  05/26/21 150 lb (68 kg)  05/19/21 154 lb (69.9 kg)    Physical Exam Vitals reviewed.  Constitutional:      Appearance: Normal appearance.  HENT:     Head: Normocephalic.  Eyes:     Extraocular Movements: Extraocular movements intact.     Conjunctiva/sclera: Conjunctivae normal.     Pupils: Pupils are equal, round, and reactive to light.  Cardiovascular:     Rate and Rhythm: Normal rate and regular rhythm.     Pulses: Normal pulses.     Heart sounds: Normal heart sounds.  Pulmonary:     Effort: Pulmonary effort is normal.     Breath sounds: Normal breath sounds.  Musculoskeletal:  General: Normal range of motion.     Cervical back: Normal range of motion and neck supple.  Skin:    General: Skin is warm and dry.     Capillary Refill: Capillary refill takes less than 2 seconds.  Neurological:     General: No focal deficit present.     Mental Status: He is alert and oriented to person, place, and time.  Psychiatric:        Mood and Affect: Mood normal.        Behavior: Behavior normal.     ASSESSMENT & PLAN: A total of 52 minutes was spent with the patient and counseling/coordination of care regarding preparing for this visit, establishing care with me, review of most recent office visit notes and available records, review of multiple chronic medical problems and their management, review of all medications and changes made, cardiovascular risks associated with hypertension and dyslipidemia, depression diagnosis and management, insomnia management, education on nutrition, prognosis, documentation, need for follow-up in 4 weeks.  Problem List Items Addressed This Visit       Cardiovascular and Mediastinum   Primary hypertension    Uncontrolled hypertension.  Continue Lodine 4 mg daily. Stop Zestoretic and start valsartan-HCTZ 160-12.5 mg daily. Monitor  blood pressure readings at home daily for the next several weeks and keep a log. Dietary approaches to stop hypertension discussed. Follow-up in 4 weeks.      Relevant Medications   valsartan-hydrochlorothiazide (DIOVAN-HCT) 160-12.5 MG tablet     Endocrine   Hypothyroidism (acquired)    Clinically euthyroid.  Continue Synthroid 75 mcg daily.      Acquired hypothyroidism     Other   Caregiver stress syndrome - Primary    Active and affecting quality of life.  Not taking Celexa for a while. May benefit from starting Trintellix 5 mg daily.  Will increase dose as needed. Follow-up in 4 weeks.      Relevant Medications   vortioxetine HBr (TRINTELLIX) 5 MG TABS tablet   Insomnia    Not sleeping and affecting quality of life.  Not taking trazodone. Recommend Ambien 5 mg at bedtime as needed.      Relevant Medications   zolpidem (AMBIEN) 5 MG tablet   Hyperlipidemia with target LDL less than 70    Stable.  Continue rosuvastatin 20 mg daily.      Relevant Medications   valsartan-hydrochlorothiazide (DIOVAN-HCT) 160-12.5 MG tablet   Other Visit Diagnoses     Uncontrolled hypertension       Relevant Medications   valsartan-hydrochlorothiazide (DIOVAN-HCT) 160-12.5 MG tablet   Encounter to establish care          Patient Instructions  Continue amlodipine 5 mg daily. Stop lisinopril-hydrochlorothiazide Start valsartan-hydrochlorothiazide 160-12.5 mg daily. Continue rosuvastatin Hypertension, Adult High blood pressure (hypertension) is when the force of blood pumping through the arteries is too strong. The arteries are the blood vessels that carry blood from the heart throughout the body. Hypertension forces the heart to work harder to pump blood and may cause arteries to become narrow or stiff. Untreated or uncontrolled hypertension can cause a heart attack, heart failure, a stroke, kidney disease, and other problems. A blood pressure reading consists of a higher number over  a lower number. Ideally, your blood pressure should be below 120/80. The first ("top") number is called the systolic pressure. It is a measure of the pressure in your arteries as your heart beats. The second ("bottom") number is called the diastolic pressure. It is a  measure of the pressure in your arteries as the heart relaxes. What are the causes? The exact cause of this condition is not known. There are some conditions that result in or are related to high blood pressure. What increases the risk? Some risk factors for high blood pressure are under your control. The following factors may make you more likely to develop this condition: Smoking. Having type 2 diabetes mellitus, high cholesterol, or both. Not getting enough exercise or physical activity. Being overweight. Having too much fat, sugar, calories, or salt (sodium) in your diet. Drinking too much alcohol. Some risk factors for high blood pressure may be difficult or impossible to change. Some of these factors include: Having chronic kidney disease. Having a family history of high blood pressure. Age. Risk increases with age. Race. You may be at higher risk if you are African American. Gender. Men are at higher risk than women before age 68. After age 63, women are at higher risk than men. Having obstructive sleep apnea. Stress. What are the signs or symptoms? High blood pressure may not cause symptoms. Very high blood pressure (hypertensive crisis) may cause: Headache. Anxiety. Shortness of breath. Nosebleed. Nausea and vomiting. Vision changes. Severe chest pain. Seizures. How is this diagnosed? This condition is diagnosed by measuring your blood pressure while you are seated, with your arm resting on a flat surface, your legs uncrossed, and your feet flat on the floor. The cuff of the blood pressure monitor will be placed directly against the skin of your upper arm at the level of your heart. It should be measured at least  twice using the same arm. Certain conditions can cause a difference in blood pressure between your right and left arms. Certain factors can cause blood pressure readings to be lower or higher than normal for a short period of time: When your blood pressure is higher when you are in a health care provider's office than when you are at home, this is called white coat hypertension. Most people with this condition do not need medicines. When your blood pressure is higher at home than when you are in a health care provider's office, this is called masked hypertension. Most people with this condition may need medicines to control blood pressure. If you have a high blood pressure reading during one visit or you have normal blood pressure with other risk factors, you may be asked to: Return on a different day to have your blood pressure checked again. Monitor your blood pressure at home for 1 week or longer. If you are diagnosed with hypertension, you may have other blood or imaging tests to help your health care provider understand your overall risk for other conditions. How is this treated? This condition is treated by making healthy lifestyle changes, such as eating healthy foods, exercising more, and reducing your alcohol intake. Your health care provider may prescribe medicine if lifestyle changes are not enough to get your blood pressure under control, and if: Your systolic blood pressure is above 130. Your diastolic blood pressure is above 80. Your personal target blood pressure may vary depending on your medical conditions, your age, and other factors. Follow these instructions at home: Eating and drinking  Eat a diet that is high in fiber and potassium, and low in sodium, added sugar, and fat. An example eating plan is called the DASH (Dietary Approaches to Stop Hypertension) diet. To eat this way: Eat plenty of fresh fruits and vegetables. Try to fill one half of your  plate at each meal with  fruits and vegetables. Eat whole grains, such as whole-wheat pasta, brown rice, or whole-grain bread. Fill about one fourth of your plate with whole grains. Eat or drink low-fat dairy products, such as skim milk or low-fat yogurt. Avoid fatty cuts of meat, processed or cured meats, and poultry with skin. Fill about one fourth of your plate with lean proteins, such as fish, chicken without skin, beans, eggs, or tofu. Avoid pre-made and processed foods. These tend to be higher in sodium, added sugar, and fat. Reduce your daily sodium intake. Most people with hypertension should eat less than 1,500 mg of sodium a day. Do not drink alcohol if: Your health care provider tells you not to drink. You are pregnant, may be pregnant, or are planning to become pregnant. If you drink alcohol: Limit how much you use to: 0-1 drink a day for women. 0-2 drinks a day for men. Be aware of how much alcohol is in your drink. In the [place] one drink equals one 12 oz bottle of beer (355 mL), one 5 oz glass of wine (148 mL), or one 1 oz glass of hard liquor (44 mL). Lifestyle  Work with your health care provider to maintain a healthy body weight or to lose weight. Ask what an ideal weight is for you. Get at least 30 minutes of exercise most days of the week. Activities may include walking, swimming, or biking. Include exercise to strengthen your muscles (resistance exercise), such as Pilates or lifting weights, as part of your weekly exercise routine. Try to do these types of exercises for 30 minutes at least 3 days a week. Do not use any products that contain nicotine or tobacco, such as cigarettes, e-cigarettes, and chewing tobacco. If you need help quitting, ask your health care provider. Monitor your blood pressure at home as told by your health care provider. Keep all follow-up visits as told by your health care provider. This is important. Medicines Take over-the-counter and prescription medicines only as told  by your health care provider. Follow directions carefully. Blood pressure medicines must be taken as prescribed. Do not skip doses of blood pressure medicine. Doing this puts you at risk for problems and can make the medicine less effective. Ask your health care provider about side effects or reactions to medicines that you should watch for. Contact a health care provider if you: Think you are having a reaction to a medicine you are taking. Have headaches that keep coming back (recurring). Feel dizzy. Have swelling in your ankles. Have trouble with your vision. Get help right away if you: Develop a severe headache or confusion. Have unusual weakness or numbness. Feel faint. Have severe pain in your chest or abdomen. Vomit repeatedly. Have trouble breathing. Summary Hypertension is when the force of blood pumping through your arteries is too strong. If this condition is not controlled, it may put you at risk for serious complications. Your personal target blood pressure may vary depending on your medical conditions, your age, and other factors. For most people, a normal blood pressure is less than 120/80. Hypertension is treated with lifestyle changes, medicines, or a combination of both. Lifestyle changes include losing weight, eating a healthy, low-sodium diet, exercising more, and limiting alcohol. This information is not intended to replace advice given to you by your health care provider. Make sure you discuss any questions you have with your health care provider. Document Revised: 03/01/2018 Document Reviewed: 03/01/2018 Elsevier Patient Education  2022 Elsevier  Inc.    Agustina Caroli, MD [facility]

## 2021-08-19 NOTE — Assessment & Plan Note (Addendum)
Uncontrolled hypertension.  Continue Lodine 4 mg daily. Stop Zestoretic and start valsartan-HCTZ 160-12.5 mg daily. Monitor blood pressure readings at home daily for the next several weeks and keep a log. Dietary approaches to stop hypertension discussed. Follow-up in 4 weeks.

## 2021-08-19 NOTE — Telephone Encounter (Signed)
I will let him know thanks

## 2021-08-19 NOTE — Assessment & Plan Note (Signed)
Stable.  Continue rosuvastatin 20 mg daily. ?

## 2021-08-19 NOTE — Assessment & Plan Note (Signed)
Not sleeping and affecting quality of life.  Not taking trazodone. Recommend Ambien 5 mg at bedtime as needed.

## 2021-08-19 NOTE — Patient Instructions (Signed)
Continue amlodipine 5 mg daily. Stop lisinopril-hydrochlorothiazide Start valsartan-hydrochlorothiazide 160-12.5 mg daily. Continue rosuvastatin Hypertension, Adult High blood pressure (hypertension) is when the force of blood pumping through the arteries is too strong. The arteries are the blood vessels that carry blood from the heart throughout the body. Hypertension forces the heart to work harder to pump blood and may cause arteries to become narrow or stiff. Untreated or uncontrolled hypertension can cause a heart attack, heart failure, a stroke, kidney disease, and other problems. A blood pressure reading consists of a higher number over a lower number. Ideally, your blood pressure should be below 120/80. The first ("top") number is called the systolic pressure. It is a measure of the pressure in your arteries as your heart beats. The second ("bottom") number is called the diastolic pressure. It is a measure of the pressure in your arteries as the heart relaxes. What are the causes? The exact cause of this condition is not known. There are some conditions that result in or are related to high blood pressure. What increases the risk? Some risk factors for high blood pressure are under your control. The following factors may make you more likely to develop this condition: Smoking. Having type 2 diabetes mellitus, high cholesterol, or both. Not getting enough exercise or physical activity. Being overweight. Having too much fat, sugar, calories, or salt (sodium) in your diet. Drinking too much alcohol. Some risk factors for high blood pressure may be difficult or impossible to change. Some of these factors include: Having chronic kidney disease. Having a family history of high blood pressure. Age. Risk increases with age. Race. You may be at higher risk if you are African American. Gender. Men are at higher risk than women before age 50. After age 39, women are at higher risk than  men. Having obstructive sleep apnea. Stress. What are the signs or symptoms? High blood pressure may not cause symptoms. Very high blood pressure (hypertensive crisis) may cause: Headache. Anxiety. Shortness of breath. Nosebleed. Nausea and vomiting. Vision changes. Severe chest pain. Seizures. How is this diagnosed? This condition is diagnosed by measuring your blood pressure while you are seated, with your arm resting on a flat surface, your legs uncrossed, and your feet flat on the floor. The cuff of the blood pressure monitor will be placed directly against the skin of your upper arm at the level of your heart. It should be measured at least twice using the same arm. Certain conditions can cause a difference in blood pressure between your right and left arms. Certain factors can cause blood pressure readings to be lower or higher than normal for a short period of time: When your blood pressure is higher when you are in a health care provider's office than when you are at home, this is called white coat hypertension. Most people with this condition do not need medicines. When your blood pressure is higher at home than when you are in a health care provider's office, this is called masked hypertension. Most people with this condition may need medicines to control blood pressure. If you have a high blood pressure reading during one visit or you have normal blood pressure with other risk factors, you may be asked to: Return on a different day to have your blood pressure checked again. Monitor your blood pressure at home for 1 week or longer. If you are diagnosed with hypertension, you may have other blood or imaging tests to help your health care provider understand your overall  risk for other conditions. How is this treated? This condition is treated by making healthy lifestyle changes, such as eating healthy foods, exercising more, and reducing your alcohol intake. Your health care provider  may prescribe medicine if lifestyle changes are not enough to get your blood pressure under control, and if: Your systolic blood pressure is above 130. Your diastolic blood pressure is above 80. Your personal target blood pressure may vary depending on your medical conditions, your age, and other factors. Follow these instructions at home: Eating and drinking  Eat a diet that is high in fiber and potassium, and low in sodium, added sugar, and fat. An example eating plan is called the DASH (Dietary Approaches to Stop Hypertension) diet. To eat this way: Eat plenty of fresh fruits and vegetables. Try to fill one half of your plate at each meal with fruits and vegetables. Eat whole grains, such as whole-wheat pasta, brown rice, or whole-grain bread. Fill about one fourth of your plate with whole grains. Eat or drink low-fat dairy products, such as skim milk or low-fat yogurt. Avoid fatty cuts of meat, processed or cured meats, and poultry with skin. Fill about one fourth of your plate with lean proteins, such as fish, chicken without skin, beans, eggs, or tofu. Avoid pre-made and processed foods. These tend to be higher in sodium, added sugar, and fat. Reduce your daily sodium intake. Most people with hypertension should eat less than 1,500 mg of sodium a day. Do not drink alcohol if: Your health care provider tells you not to drink. You are pregnant, may be pregnant, or are planning to become pregnant. If you drink alcohol: Limit how much you use to: 0-1 drink a day for women. 0-2 drinks a day for men. Be aware of how much alcohol is in your drink. In the [place] one drink equals one 12 oz bottle of beer (355 mL), one 5 oz glass of wine (148 mL), or one 1 oz glass of hard liquor (44 mL). Lifestyle  Work with your health care provider to maintain a healthy body weight or to lose weight. Ask what an ideal weight is for you. Get at least 30 minutes of exercise most days of the week. Activities may  include walking, swimming, or biking. Include exercise to strengthen your muscles (resistance exercise), such as Pilates or lifting weights, as part of your weekly exercise routine. Try to do these types of exercises for 30 minutes at least 3 days a week. Do not use any products that contain nicotine or tobacco, such as cigarettes, e-cigarettes, and chewing tobacco. If you need help quitting, ask your health care provider. Monitor your blood pressure at home as told by your health care provider. Keep all follow-up visits as told by your health care provider. This is important. Medicines Take over-the-counter and prescription medicines only as told by your health care provider. Follow directions carefully. Blood pressure medicines must be taken as prescribed. Do not skip doses of blood pressure medicine. Doing this puts you at risk for problems and can make the medicine less effective. Ask your health care provider about side effects or reactions to medicines that you should watch for. Contact a health care provider if you: Think you are having a reaction to a medicine you are taking. Have headaches that keep coming back (recurring). Feel dizzy. Have swelling in your ankles. Have trouble with your vision. Get help right away if you: Develop a severe headache or confusion. Have unusual weakness or numbness.  Feel faint. Have severe pain in your chest or abdomen. Vomit repeatedly. Have trouble breathing. Summary Hypertension is when the force of blood pumping through your arteries is too strong. If this condition is not controlled, it may put you at risk for serious complications. Your personal target blood pressure may vary depending on your medical conditions, your age, and other factors. For most people, a normal blood pressure is less than 120/80. Hypertension is treated with lifestyle changes, medicines, or a combination of both. Lifestyle changes include losing weight, eating a healthy,  low-sodium diet, exercising more, and limiting alcohol. This information is not intended to replace advice given to you by your health care provider. Make sure you discuss any questions you have with your health care provider. Document Revised: 03/01/2018 Document Reviewed: 03/01/2018 Elsevier Patient Education  [place].

## 2021-08-19 NOTE — Assessment & Plan Note (Signed)
Active and affecting quality of life.  Not taking Celexa for a while. May benefit from starting Trintellix 5 mg daily.  Will increase dose as needed. Follow-up in 4 weeks.

## 2021-08-19 NOTE — Assessment & Plan Note (Signed)
Clinically euthyroid.  Continue Synthroid 75 mcg daily. 

## 2021-08-28 ENCOUNTER — Ambulatory Visit: Payer: Medicare HMO | Admitting: Psychologist

## 2021-09-19 ENCOUNTER — Other Ambulatory Visit: Payer: Self-pay | Admitting: Internal Medicine

## 2021-09-19 DIAGNOSIS — I119 Hypertensive heart disease without heart failure: Secondary | ICD-10-CM

## 2021-09-19 DIAGNOSIS — I10 Essential (primary) hypertension: Secondary | ICD-10-CM

## 2021-09-24 ENCOUNTER — Ambulatory Visit: Payer: Medicare HMO | Admitting: Internal Medicine

## 2021-09-24 ENCOUNTER — Other Ambulatory Visit: Payer: Self-pay

## 2021-09-24 NOTE — Progress Notes (Signed)
? ?  Subjective:  ?Patient ID: Christopher Arroyo, male    DOB: 04-01-1946  Age: 76 y.o. MRN: [id number] ? ?CC: No chief complaint on file. ? ? ?HPI ?[name] presents for not seen ? ?Outpatient Medications Prior to Visit  ?Medication Sig Dispense Refill  ? amLODipine (NORVASC) 5 MG tablet Take 1 tablet (5 mg total) by mouth daily. 90 tablet 0  ? aspirin EC 81 MG tablet Take 81 mg by mouth daily. Swallow whole.    ? dorzolamide-timolol (COSOPT) 22.3-6.8 MG/ML ophthalmic solution Place 1 drop into both eyes 2 (two) times daily.    ? levothyroxine (SYNTHROID) 75 MCG tablet Take 1 tablet (75 mcg total) by mouth daily before breakfast. 90 tablet 1  ? meloxicam (MOBIC) 7.5 MG tablet meloxicam 7.5 mg tablet ? TAKE 1 TABLET BY MOUTH EVERY DAY    ? methocarbamol (ROBAXIN) 500 MG tablet Take 500 mg by mouth 2 (two) times daily as needed. (Patient not taking: Reported on 08/19/2021)    ? Multiple Vitamin (MULTIVITAMIN WITH MINERALS) TABS tablet Take 1 tablet by mouth daily.    ? rosuvastatin (CRESTOR) 20 MG tablet Take 1 tablet (20 mg total) by mouth daily. 90 tablet 1  ? valsartan-hydrochlorothiazide (DIOVAN-HCT) 160-12.5 MG tablet Take 1 tablet by mouth daily. 90 tablet 3  ? zolpidem (AMBIEN) 5 MG tablet Take 1 tablet (5 mg total) by mouth at bedtime as needed for sleep. 15 tablet 1  ? ?No facility-administered medications prior to visit.  ? ? ?ROS ?Review of Systems ? ?Objective:  ?There were no vitals taken for this visit. ? ?BP Readings from Last 3 Encounters:  ?08/19/21 (!) 148/86  ?05/26/21 (!) 150/105  ?05/19/21 (!) 142/90  ? ? ?Wt Readings from Last 3 Encounters:  ?08/19/21 155 lb (70.3 kg)  ?05/26/21 150 lb (68 kg)  ?05/19/21 154 lb (69.9 kg)  ? ? ?Physical Exam ? ?Lab Results  ?Component Value Date  ? WBC 5.7 04/30/2021  ? HGB 14.6 04/30/2021  ? HCT 44.2 04/30/2021  ? PLT 216.0 04/30/2021  ? GLUCOSE 103 (H) 05/13/2021  ? CHOL 228 (H) 02/05/2021  ? TRIG 155.0 (H) 02/05/2021  ? HDL 65.00 02/05/2021  ? LDLCALC 132 (H)  02/05/2021  ? ALT 19 04/30/2021  ? AST 29 04/30/2021  ? NA 136 05/13/2021  ? K 4.0 05/13/2021  ? CL 100 05/13/2021  ? CREATININE 1.01 05/13/2021  ? BUN 10 05/13/2021  ? CO2 30 05/13/2021  ? TSH 4.60 05/13/2021  ? PSA 0.18 05/13/2021  ? INR 1.0 04/30/2021  ? HGBA1C 5.9 02/05/2021  ? ? ?No results found. ? ?Assessment & Plan:  ? ?There are no diagnoses linked to this encounter. ?I am having [name] maintain his aspirin EC, dorzolamide-timolol, rosuvastatin, levothyroxine, multivitamin with minerals, methocarbamol, amLODipine, meloxicam, valsartan-hydrochlorothiazide, and zolpidem. ? ?No orders of the defined types were placed in this encounter. ? ? ? ?Follow-up: No follow-ups on file. ? ?Sanda Linger, MD ?

## 2021-09-28 ENCOUNTER — Ambulatory Visit: Payer: Medicare HMO | Admitting: Emergency Medicine

## 2021-10-01 ENCOUNTER — Ambulatory Visit (INDEPENDENT_AMBULATORY_CARE_PROVIDER_SITE_OTHER): Payer: Medicare HMO | Admitting: Emergency Medicine

## 2021-10-01 ENCOUNTER — Encounter: Payer: Self-pay | Admitting: Emergency Medicine

## 2021-10-01 VITALS — BP 126/84 | HR 77 | Temp 98.5°F | Ht 68.0 in | Wt 149.4 lb

## 2021-10-01 DIAGNOSIS — L298 Other pruritus: Secondary | ICD-10-CM

## 2021-10-01 DIAGNOSIS — E785 Hyperlipidemia, unspecified: Secondary | ICD-10-CM

## 2021-10-01 DIAGNOSIS — I10 Essential (primary) hypertension: Secondary | ICD-10-CM | POA: Diagnosis not present

## 2021-10-01 MED ORDER — DIPHENHYDRAMINE HCL 2 % EX GEL: 1.0000 "application " | Freq: Two times a day (BID) | CUTANEOUS | 2 refills | Status: DC

## 2021-10-01 NOTE — Assessment & Plan Note (Signed)
Well-controlled hypertension.  Continue amlodipine 5 mg and Diovan HCTZ 160-12.5 mg daily. ?BP Readings from Last 3 Encounters:  ?10/01/21 126/84  ?08/19/21 (!) 148/86  ?05/26/21 (!) 150/105  ? ? ?

## 2021-10-01 NOTE — Assessment & Plan Note (Signed)
Stable.  Continue rosuvastatin 20 mg daily. ?The 10-year ASCVD risk score (Arnett DK, et al., 2019) is: 33.9% ?  Values used to calculate the score: ?    Age: 76 years ?    Sex: Male ?    Is Non-Hispanic African American: Yes ?    Diabetic: Yes ?    Tobacco smoker: No ?    Systolic Blood Pressure: 126 mmHg ?    Is BP treated: Yes ?    HDL Cholesterol: 65 mg/dL ?    Total Cholesterol: 228 mg/dL ? ?

## 2021-10-01 NOTE — Progress Notes (Signed)
Christopher Arroyo ?76 y.o. ? ? ?Chief Complaint  ?Patient presents with  ? Follow-up  ? groin  ?  Groin area itching , below testicles x 3 months   ? ? ?HISTORY OF PRESENT ILLNESS: ?This is a 76 y.o. male complaining of groin itching for the past 3 months. ?Also history of hypertension.  Seen by me on 08/19/2021 and switched to Diovan-HCTZ 160-12.5 mg daily.  Continues taking amlodipine 5 mg daily. ?No other complaints or medical concerns today. ?BP Readings from Last 3 Encounters:  ?10/01/21 126/84  ?08/19/21 (!) 148/86  ?05/26/21 (!) 150/105  ? ? ? ?HPI ? ? ?Prior to Admission medications   ?Medication Sig Start Date End Date Taking? Authorizing Provider  ?amLODipine (NORVASC) 5 MG tablet Take 1 tablet (5 mg total) by mouth daily. 06/10/21  Yes Etta Grandchild, MD  ?aspirin EC 81 MG tablet Take 81 mg by mouth daily. Swallow whole.   Yes [provider]  ?dorzolamide-timolol (COSOPT) 22.3-6.8 MG/ML ophthalmic solution Place 1 drop into both eyes 2 (two) times daily.   Yes [provider]  ?levothyroxine (SYNTHROID) 75 MCG tablet Take 1 tablet (75 mcg total) by mouth daily before breakfast. 02/06/21  Yes Etta Grandchild, MD  ?meloxicam (MOBIC) 7.5 MG tablet meloxicam 7.5 mg tablet ? TAKE 1 TABLET BY MOUTH EVERY DAY   Yes [provider]  ?Multiple Vitamin (MULTIVITAMIN WITH MINERALS) TABS tablet Take 1 tablet by mouth daily.   Yes [provider]  ?rosuvastatin (CRESTOR) 20 MG tablet Take 1 tablet (20 mg total) by mouth daily. 02/05/21  Yes Etta Grandchild, MD  ?valsartan-hydrochlorothiazide (DIOVAN-HCT) 160-12.5 MG tablet Take 1 tablet by mouth daily. 08/19/21  Yes Leisa Gault, Eilleen Kempf, MD  ?zolpidem (AMBIEN) 5 MG tablet Take 1 tablet (5 mg total) by mouth at bedtime as needed for sleep. 08/19/21  Yes Iveliz Garay, Eilleen Kempf, MD  ?methocarbamol (ROBAXIN) 500 MG tablet Take 500 mg by mouth 2 (two) times daily as needed. ?Patient not taking: Reported on 10/01/2021 04/26/21   [provider]  ? ? ?No Known Allergies ? ?Patient Active Problem List  ? Diagnosis Date Noted  ? Asymptomatic microscopic hematuria 05/14/2021  ? 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 Screening for prostate cancer 05/13/2021  ? LVH (left ventricular hypertrophy) due to hypertensive disease, without heart failure 05/13/2021  ? Hypokalemia 02/05/2021  ? Type II diabetes mellitus with manifestations (HCC) 02/05/2021  ? Acquired hypothyroidism 02/05/2021  ? Hyperlipidemia with target LDL less than 70 02/05/2021  ? Headaches, cluster 11/24/2020  ? Hepatitis C 11/24/2020  ? Hepatitis B core antibody positive 11/24/2020  ? 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 TIA (transient ischemic attack) 05/27/2020  ? 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 Vertigo, intermittent 10/31/2013  ? Anxiety 10/11/2011  ? Hypothyroidism (acquired) 10/11/2011  ? Insomnia 10/11/2011  ? Primary hypertension 10/11/2011  ? ? ?Past Medical History:  ?Diagnosis Date  ? Anxiety and depression   ? Current use of long term anticoagulation   ? DAPT (ASA + clopidogrel)  ? Diabetes mellitus without complication (HCC)   ? History of hiatal hernia   ? HLD (hyperlipidemia)   ? Hypertension   ? Hypothyroidism   ? PSA elevation   ? Right internal carotid artery aneurysm   ? s/p pipeline embolization on 08/08/2020  ? T2DM (type 2 diabetes mellitus) (HCC) 09/02/2020  ? TIA (transient ischemic attack) 2009  ? left hand weakness, decrease fine  motor skills  ? ? ?Past Surgical History:  ?Procedure Laterality Date  ? CAROTID ANGIOGRAM Right 02/20/2021  ? CEREBRAL ANGIOGRAM N/A 04/24/2020  ? Location: [facility]  ? SP EMBOLIZATION INTRACRANIAL Right 08/08/2020  ? 4.75 x 14 mm pipeline embolization device (PED) placement to RIGHT ICA cavernous segment aneurysm; Location: [facility]; Surgeon: Johnsie Cancel, MD  ? TONSILLECTOMY    ? ? ?Social History  ? ?Socioeconomic History  ? Marital status: Married  ?  Spouse name: Not on file  ? Number of  children: Not on file  ? Years of education: Not on file  ? Highest education level: Not on file  ?Occupational History  ? Not on file  ?Tobacco Use  ? Smoking status: Former  ?  Types: Cigarettes  ?  Quit date: 83  ?  Years since quitting: 39.2  ? Smokeless tobacco: Never  ?Vaping Use  ? Vaping Use: Never used  ?Substance and Sexual Activity  ? Alcohol use: Not Currently  ? Drug use: Not Currently  ?  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 Sexual activity: Not on file  ?Other Topics Concern  ? Not on file  ?Social History Narrative  ? Not on file  ? ?Social Determinants of Health  ? ?Financial Resource Strain: Not on file  ?Food Insecurity: Not on file  ?Transportation Needs: Not on file  ?Physical Activity: Not on file  ?Stress: Not on file  ?Social Connections: Not on file  ?Intimate Partner Violence: Not on file  ? ? ?No family history on file. ? ? ?Review of Systems  ?Constitutional: Negative.  Negative for chills and fever.  ?HENT:  Negative for congestion and sore throat.   ?Respiratory: Negative.  Negative for cough and shortness of breath.   ?Cardiovascular: Negative.  Negative for chest pain and palpitations.  ?Gastrointestinal:  Negative for abdominal pain, diarrhea, nausea and vomiting.  ?Genitourinary: Negative.   ?Skin:  Positive for itching (groin). Negative for rash.  ?Neurological:  Negative for dizziness and headaches.  ?All other systems reviewed and are negative. ? ?Today's Vitals  ? 10/01/21 1446  ?BP: 126/84  ?Pulse: 77  ?Temp: 98.5 ?F (36.9 ?C)  ?SpO2: 98%  ?Weight: 149 lb 6 oz (67.8 kg)  ?Height: 5\' 8"  (1.727 m)  ? ?Body mass index is 22.71 kg/m?. ? ?Physical Exam ?Vitals reviewed.  ?Constitutional:   ?   Appearance: Normal appearance.  ?HENT:  ?   Head: Normocephalic.  ?Eyes:  ?   Extraocular Movements: Extraocular movements intact.  ?   Pupils: Pupils are equal, round, and reactive to light.  ?Cardiovascular:  ?   Rate and Rhythm: Normal rate and regular rhythm.  ?   Pulses: Normal  pulses.  ?   Heart sounds: Normal heart sounds.  ?Pulmonary:  ?   Effort: Pulmonary effort is normal.  ?   Breath sounds: Normal breath sounds.  ?Abdominal:  ?   Palpations: Abdomen is soft.  ?   Tenderness: There is no abdominal tenderness.  ?   Hernia: There is no hernia in the left inguinal area or right inguinal area.  ?Genitourinary: ?   Pubic Area: No rash.   ?   Penis: Normal and circumcised. No erythema, tenderness or swelling.   ?   Testes: Normal.  ?   Epididymis:  ?   Right: Normal.  ?   Left: Normal.  ?Musculoskeletal:  ?   Cervical back: No tenderness.  ?Lymphadenopathy:  ?   Cervical:  No cervical adenopathy.  ?   Lower Body: No right inguinal adenopathy. No left inguinal adenopathy.  ?Skin: ?   General: Skin is warm and dry.  ?   Capillary Refill: Capillary refill takes less than 2 seconds.  ?   Comments: No groin rashes noted  ?Neurological:  ?   General: No focal deficit present.  ?   Mental Status: He is alert and oriented to person, place, and time.  ?Psychiatric:     ?   Mood and Affect: Mood normal.     ?   Behavior: Behavior normal.  ? ? ? ?ASSESSMENT & PLAN: ?Problem List Items Addressed This Visit   ? ?  ? Cardiovascular and Mediastinum  ? Primary hypertension - Primary  ?  Well-controlled hypertension.  Continue amlodipine 5 mg and Diovan HCTZ 160-12.5 mg daily. ?BP Readings from Last 3 Encounters:  ?10/01/21 126/84  ?08/19/21 (!) 148/86  ?05/26/21 (!) 150/105  ? ? ?  ?  ?  ? Other  ? Dyslipidemia  ?  Stable.  Continue rosuvastatin 20 mg daily. ?The 10-year ASCVD risk score (Arnett DK, et al., 2019) is: 33.9% ?  Values used to calculate the score: ?    Age: [age] years ?    Sex: Male ?    Is Non-Hispanic African American: Yes ?    Diabetic: Yes ?    Tobacco smoker: No ?    Systolic Blood Pressure: 126 mmHg ?    Is BP treated: Yes ?    HDL Cholesterol: 65 mg/dL ?    Total Cholesterol: 228 mg/dL ? ?  ?  ? ?Other Visit Diagnoses   ? ? Pruritus of groin in male      ? Relevant Medications  ?  DIPHENHYDRAMINE HCL, TOPICAL, 2 % GEL  ? Other Relevant Orders  ? Ambulatory referral to Dermatology  ? ?  ? ?Patient Instructions  ?Hypertension, Adult ?High blood pressure (hypertension) is when the force of blood pum

## 2021-10-01 NOTE — Patient Instructions (Signed)

## 2021-10-02 ENCOUNTER — Other Ambulatory Visit: Payer: Self-pay | Admitting: Internal Medicine

## 2021-10-02 DIAGNOSIS — I10 Essential (primary) hypertension: Secondary | ICD-10-CM

## 2021-10-02 DIAGNOSIS — I119 Hypertensive heart disease without heart failure: Secondary | ICD-10-CM

## 2021-10-15 ENCOUNTER — Encounter: Payer: Self-pay | Admitting: Internal Medicine

## 2021-10-15 ENCOUNTER — Ambulatory Visit (INDEPENDENT_AMBULATORY_CARE_PROVIDER_SITE_OTHER): Payer: Medicare HMO | Admitting: Internal Medicine

## 2021-10-15 VITALS — BP 126/74 | HR 64 | Temp 97.9°F | Ht 68.0 in | Wt 155.4 lb

## 2021-10-15 DIAGNOSIS — J309 Allergic rhinitis, unspecified: Secondary | ICD-10-CM | POA: Diagnosis not present

## 2021-10-15 DIAGNOSIS — I10 Essential (primary) hypertension: Secondary | ICD-10-CM

## 2021-10-15 DIAGNOSIS — G44009 Cluster headache syndrome, unspecified, not intractable: Secondary | ICD-10-CM | POA: Diagnosis not present

## 2021-10-15 MED ORDER — PREDNISONE 10 MG PO TABS: ORAL_TABLET | ORAL | 0 refills | Status: DC

## 2021-10-15 MED ORDER — TRIAMCINOLONE ACETONIDE 55 MCG/ACT NA AERO: 2.0000 | INHALATION_SPRAY | Freq: Every day | NASAL | 12 refills | Status: DC

## 2021-10-15 NOTE — Patient Instructions (Addendum)
Ok to continue your OTC anthistamine as you already take ? ?Please take all new medication as prescribed - the prednisone, and also the nasacort as directed as well ? ?You had the toradol 30 mg shot today ? ?Please continue all other medications as before, and refills have been done if requested. ? ?Please have the pharmacy call with any other refills you may need. ? ?Please keep your appointments with your specialists as you may have planned ? ? ? ? ?

## 2021-10-15 NOTE — Progress Notes (Signed)
Patient ID: Christopher Arroyo, male   DOB: 1945-09-22, 76 y.o.   MRN: [id number] ? ? ? ?    Chief Complaint: follow up Headache, allergies, htn ? ?     HPI:  Christopher Arroyo is a 76 y.o. male here with c/o hx of cluster headaches, now with similar; seen and tx with toradol last visit successfully, hoping for repeat tx today; Has hx of cluster HA about 20 yrs ago with living in [place].  About 1 wk ago with sudden onset significant sinus and nasal allergy symtpoms, was treated previously with tapering prednisone regimen over about 10 days.  Pt denies chest pain, increased sob or doe, wheezing, orthopnea, PND, increased LE swelling, palpitations, dizziness or syncope.   Pt denies polydipsia, polyuria, or new focal neuro s/s.    Pt denies fever, wt loss, night sweats, loss of appetite, or other constitutional symptoms   ?Wt Readings from Last 3 Encounters:  ?10/15/21 155 lb 6.4 oz (70.5 kg)  ?10/01/21 149 lb 6 oz (67.8 kg)  ?08/19/21 155 lb (70.3 kg)  ? ?BP Readings from Last 3 Encounters:  ?10/15/21 126/74  ?10/01/21 126/84  ?08/19/21 (!) 148/86  ? ?      ?Past Medical History:  ?Diagnosis Date  ? Anxiety and depression   ? Current use of long term anticoagulation   ? DAPT (ASA + clopidogrel)  ? Diabetes mellitus without complication (HCC)   ? History of hiatal hernia   ? HLD (hyperlipidemia)   ? Hypertension   ? Hypothyroidism   ? PSA elevation   ? Right internal carotid artery aneurysm   ? s/p pipeline embolization on 08/08/2020  ? T2DM (type 2 diabetes mellitus) (HCC) 09/02/2020  ? TIA (transient ischemic attack) 2009  ? left hand weakness, decrease fine motor skills  ? ?Past Surgical History:  ?Procedure Laterality Date  ? CAROTID ANGIOGRAM Right 02/20/2021  ? CEREBRAL ANGIOGRAM N/A 04/24/2020  ? Location: [facility]  ? SP EMBOLIZATION INTRACRANIAL Right 08/08/2020  ? 4.75 x 14 mm pipeline embolization device (PED) placement to RIGHT ICA cavernous segment aneurysm; Location: [facility]; Surgeon: Johnsie Cancel, MD  ? TONSILLECTOMY    ? ?  reports that he quit smoking about 39 years ago. His smoking use included cigarettes. He has never used smokeless tobacco. He reports that he does not currently use alcohol. 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 ?family history is not on file. ?No Known Allergies ?Current Outpatient Medications on File Prior to Visit  ?Medication Sig Dispense Refill  ? aspirin EC 81 MG tablet Take 81 mg by mouth daily. Swallow whole.    ? DIPHENHYDRAMINE HCL, TOPICAL, 2 % GEL Apply 1 application. topically in the morning and at bedtime. 118 mL 2  ? dorzolamide-timolol (COSOPT) 22.3-6.8 MG/ML ophthalmic solution Place 1 drop into both eyes 2 (two) times daily.    ? levothyroxine (SYNTHROID) 75 MCG tablet Take 1 tablet (75 mcg total) by mouth daily before breakfast. 90 tablet 1  ? meloxicam (MOBIC) 7.5 MG tablet meloxicam 7.5 mg tablet ? TAKE 1 TABLET BY MOUTH EVERY DAY    ? Multiple Vitamin (MULTIVITAMIN WITH MINERALS) TABS tablet Take 1 tablet by mouth daily.    ? rosuvastatin (CRESTOR) 20 MG tablet Take 1 tablet (20 mg total) by mouth daily. 90 tablet 1  ? valsartan-hydrochlorothiazide (DIOVAN-HCT) 160-12.5 MG tablet Take 1 tablet by mouth daily. 90 tablet 3  ? zolpidem (AMBIEN) 5 MG tablet Take 1  tablet (5 mg total) by mouth at bedtime as needed for sleep. 15 tablet 1  ? methocarbamol (ROBAXIN) 500 MG tablet Take 500 mg by mouth 2 (two) times daily as needed. (Patient not taking: Reported on 10/01/2021)    ? ?No current facility-administered medications on file prior to visit.  ? ?     ROS:  All others reviewed and negative. ? ?Objective  ? ?     PE:  BP 126/74 (BP Location: Right Arm, Patient Position: Sitting, Cuff Size: Normal)   Pulse 64   Temp 97.9 ?F (36.6 ?C) (Oral)   Ht 5\' 8"  (1.727 m)   Wt 155 lb 6.4 oz (70.5 kg)   SpO2 98%   BMI 23.63 kg/m?  ? ?              Constitutional: Pt appears in NAD ?              HENT: Head: NCAT.  ?              Right Ear: External  ear normal.   ?              Left Ear: External ear normal.  Bilat tm's with mild erythema.  Max sinus areas non tender.  Pharynx with mild erythema, no exudate ?              Eyes: . Pupils are equal, round, and reactive to light. Conjunctivae and EOM are normal ?              Nose: without d/c or deformity ?              Neck: Neck supple. Gross normal ROM ?              Cardiovascular: Normal rate and regular rhythm.   ?              Pulmonary/Chest: Effort normal and breath sounds without rales or wheezing.  ?              Abd:  Soft, NT, ND, + BS, no organomegaly ?              Neurological: Pt is alert. At baseline orientation, motor grossly intact, cn 2-12 intact ?              Skin: Skin is warm. No rashes, no other new lesions, LE edema - none ?              Psychiatric: Pt behavior is normal without agitation  ? ?Micro: none ? ?Cardiac tracings I have personally interpreted today:  none ? ?Pertinent Radiological findings (summarize): none  ? ?Lab Results  ?Component Value Date  ? WBC 5.7 04/30/2021  ? HGB 14.6 04/30/2021  ? HCT 44.2 04/30/2021  ? PLT 216.0 04/30/2021  ? GLUCOSE 103 (H) 05/13/2021  ? CHOL 228 (H) 02/05/2021  ? TRIG 155.0 (H) 02/05/2021  ? HDL 65.00 02/05/2021  ? LDLCALC 132 (H) 02/05/2021  ? ALT 19 04/30/2021  ? AST 29 04/30/2021  ? NA 136 05/13/2021  ? K 4.0 05/13/2021  ? CL 100 05/13/2021  ? CREATININE 1.01 05/13/2021  ? BUN 10 05/13/2021  ? CO2 30 05/13/2021  ? TSH 4.60 05/13/2021  ? PSA 0.18 05/13/2021  ? INR 1.0 04/30/2021  ? HGBA1C 5.9 02/05/2021  ? ?Assessment/Plan:  ?Christopher Arroyo is a 76 y.o. Black or African American [2] male with  has a past medical history of  Anxiety and depression, Current use of long term anticoagulation, Diabetes mellitus without complication (HCC), History of hiatal hernia, HLD (hyperlipidemia), Hypertension, Hypothyroidism, PSA elevation, Right internal carotid artery aneurysm, T2DM (type 2 diabetes mellitus) (HCC) (09/02/2020), and TIA (transient ischemic  attack) (2009). ? ?Cluster headaches ?Recurring, for toradol 30 mg IM ? ?Allergic rhinitis ?Mild to mod, for prednisone course,  to f/u any worsening symptoms or concerns ? ?Primary hypertension ?BP Readings from Last 3 Encounters:  ?10/15/21 126/74  ?10/01/21 126/84  ?08/19/21 (!) 148/86  ? ?Stable, pt to continue medical treatment diovan hct, norvasc ? ?Followup: Return if symptoms worsen or fail to improve. ? ?Oliver BarreJames Elison Worrel, MD 10/22/2021 8:40 PM ?[facility] ?[facility] ?Internal Medicine ?

## 2021-10-18 ENCOUNTER — Other Ambulatory Visit: Payer: Self-pay | Admitting: Internal Medicine

## 2021-10-18 DIAGNOSIS — I119 Hypertensive heart disease without heart failure: Secondary | ICD-10-CM

## 2021-10-18 DIAGNOSIS — I10 Essential (primary) hypertension: Secondary | ICD-10-CM

## 2021-10-19 ENCOUNTER — Telehealth: Payer: Self-pay | Admitting: Emergency Medicine

## 2021-10-19 DIAGNOSIS — I119 Hypertensive heart disease without heart failure: Secondary | ICD-10-CM

## 2021-10-19 DIAGNOSIS — I10 Essential (primary) hypertension: Secondary | ICD-10-CM

## 2021-10-19 MED ORDER — AMLODIPINE BESYLATE 5 MG PO TABS: 5.0000 mg | ORAL_TABLET | Freq: Every day | ORAL | 0 refills | Status: DC

## 2021-10-19 NOTE — Telephone Encounter (Signed)
1.Medication Requested: ?Indapamine 1.25 mg tab  ?amLODipine (NORVASC) 5 MG tablet ?valsartan-hydrochlorothiazide (DIOVAN-HCT) 160-12.5 MG tablet ?2. Pharmacy (Name, Street, [place]): ?[id number] [address], [place] [address] Phone:  [phone]  ?Fax:  [phone]  ?  ? ?3. On Med List: y ? ?4. Last Visit with PCP: ? ?5. Next visit date with PCP: ? ? ?Agent: Please be advised that RX refills may take up to 3 business days. We ask that you follow-up with your pharmacy.  ?

## 2021-10-19 NOTE — Telephone Encounter (Signed)
New rx sent to pharmacy on file

## 2021-10-21 ENCOUNTER — Other Ambulatory Visit: Payer: Self-pay | Admitting: Internal Medicine

## 2021-10-21 DIAGNOSIS — I10 Essential (primary) hypertension: Secondary | ICD-10-CM

## 2021-10-21 DIAGNOSIS — I119 Hypertensive heart disease without heart failure: Secondary | ICD-10-CM

## 2021-10-21 NOTE — Telephone Encounter (Signed)
Pt returning cmas call ? ?Informed pt of providers response ? ?Pt verbalized understanding and agreed ?

## 2021-10-21 NOTE — Telephone Encounter (Signed)
Should not be on indapamine.  I have him on amlodipine and Diovan-HCTZ.  Those are the only 2 blood pressure medications I want him taking.  Indapamine  is very similar to HCTZ, he does not need it.  Thanks.

## 2021-10-21 NOTE — Telephone Encounter (Signed)
Pt checking status of Indapamine 1.25 mg tab refill ? ?Advised pt I did not see medication on current med list, medication is on discontinue list ? ?Pt requesting a cb  ?

## 2021-10-21 NOTE — Telephone Encounter (Signed)
Called patient and left message for patient to call back in reference to medication refill request and provider recommendation  ?

## 2021-10-22 ENCOUNTER — Encounter: Payer: Self-pay | Admitting: Internal Medicine

## 2021-10-22 NOTE — Assessment & Plan Note (Signed)
Mild to mod, for prednisone course,  to f/u any worsening symptoms or concerns 

## 2021-10-22 NOTE — Assessment & Plan Note (Signed)
Recurring, for toradol 30 mg IM ?

## 2021-10-22 NOTE — Assessment & Plan Note (Signed)
BP Readings from Last 3 Encounters:  ?10/15/21 126/74  ?10/01/21 126/84  ?08/19/21 (!) 148/86  ? ?Stable, pt to continue medical treatment diovan hct, norvasc ? ?

## 2021-10-24 DIAGNOSIS — M25511 Pain in right shoulder: Secondary | ICD-10-CM | POA: Diagnosis not present

## 2021-10-24 DIAGNOSIS — M75121 Complete rotator cuff tear or rupture of right shoulder, not specified as traumatic: Secondary | ICD-10-CM | POA: Diagnosis not present

## 2021-11-07 DIAGNOSIS — M75121 Complete rotator cuff tear or rupture of right shoulder, not specified as traumatic: Secondary | ICD-10-CM | POA: Diagnosis not present

## 2021-11-07 DIAGNOSIS — M25511 Pain in right shoulder: Secondary | ICD-10-CM | POA: Diagnosis not present

## 2021-11-16 IMAGING — DX DG RIBS W/ CHEST 3+V*L*
3 series · 3 of 3 positions shown · non-contrast
Comparison: None.

CLINICAL DATA: Bruising and left-sided rib pain for 2 days, no
known injury

EXAM:
LEFT RIBS AND CHEST - 3+ VIEW

[chest pa]
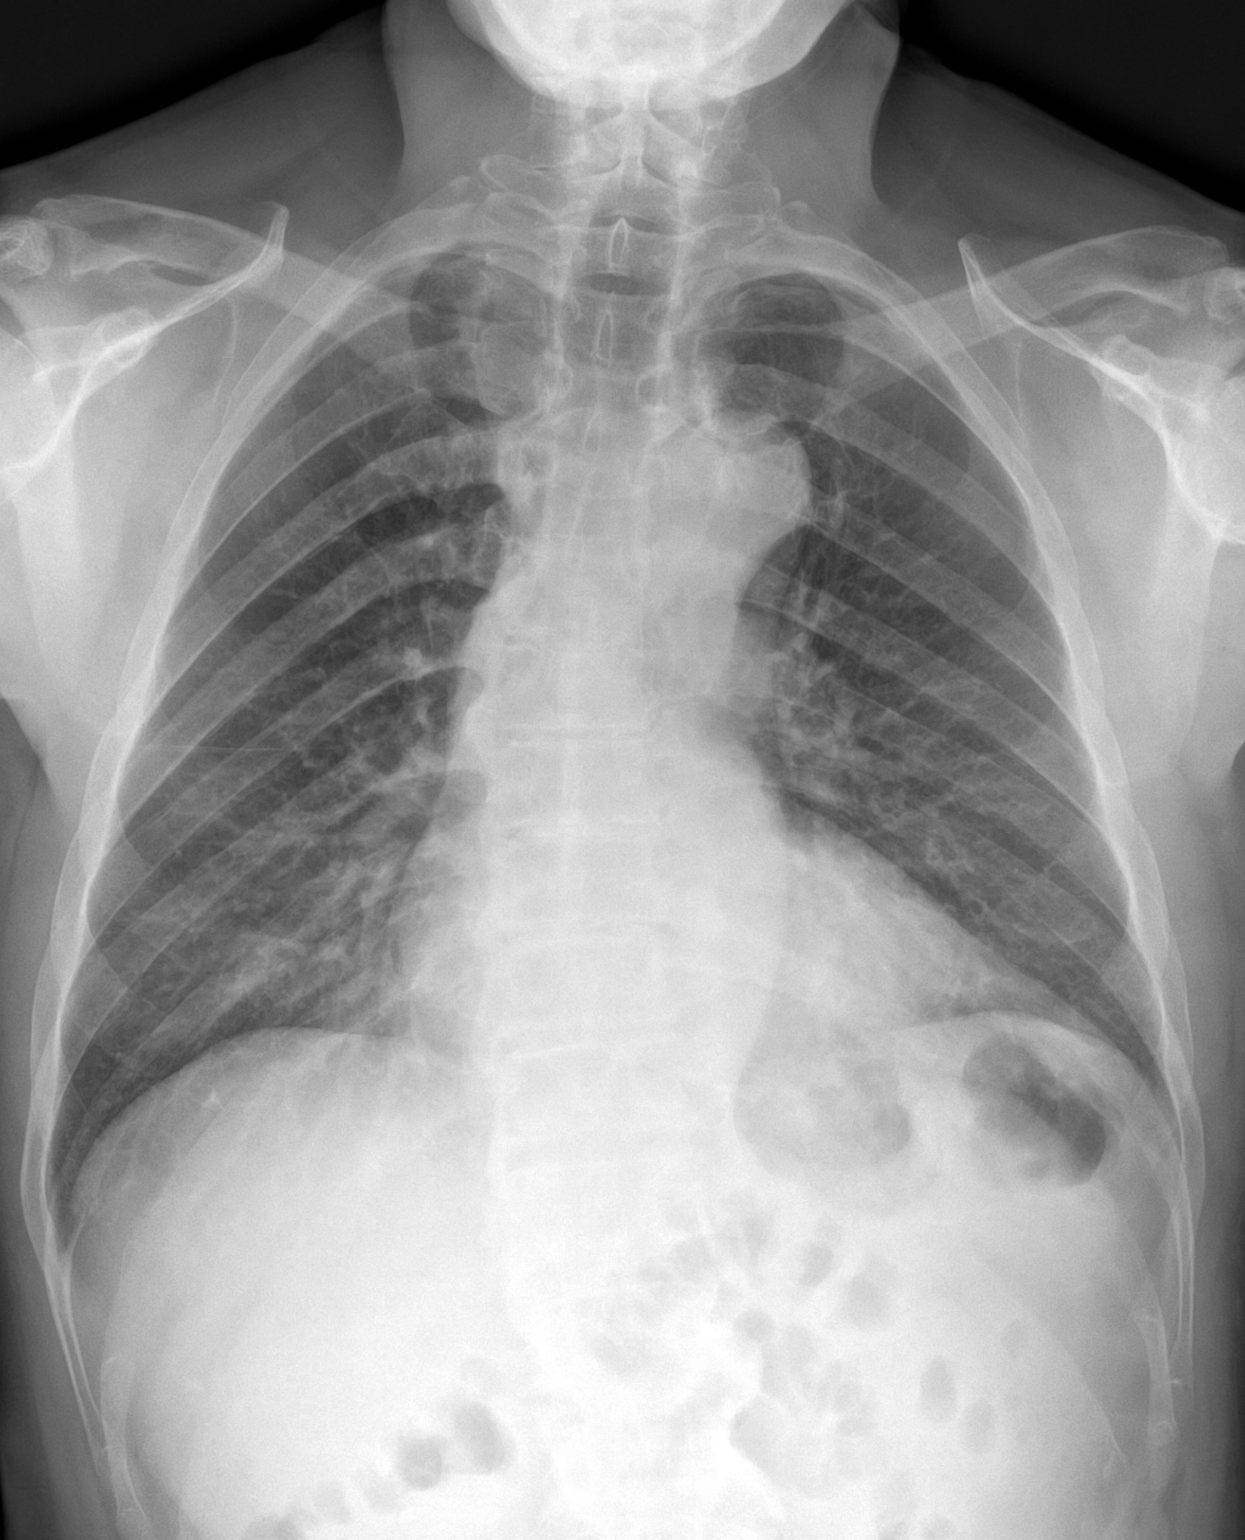

[rib ap]
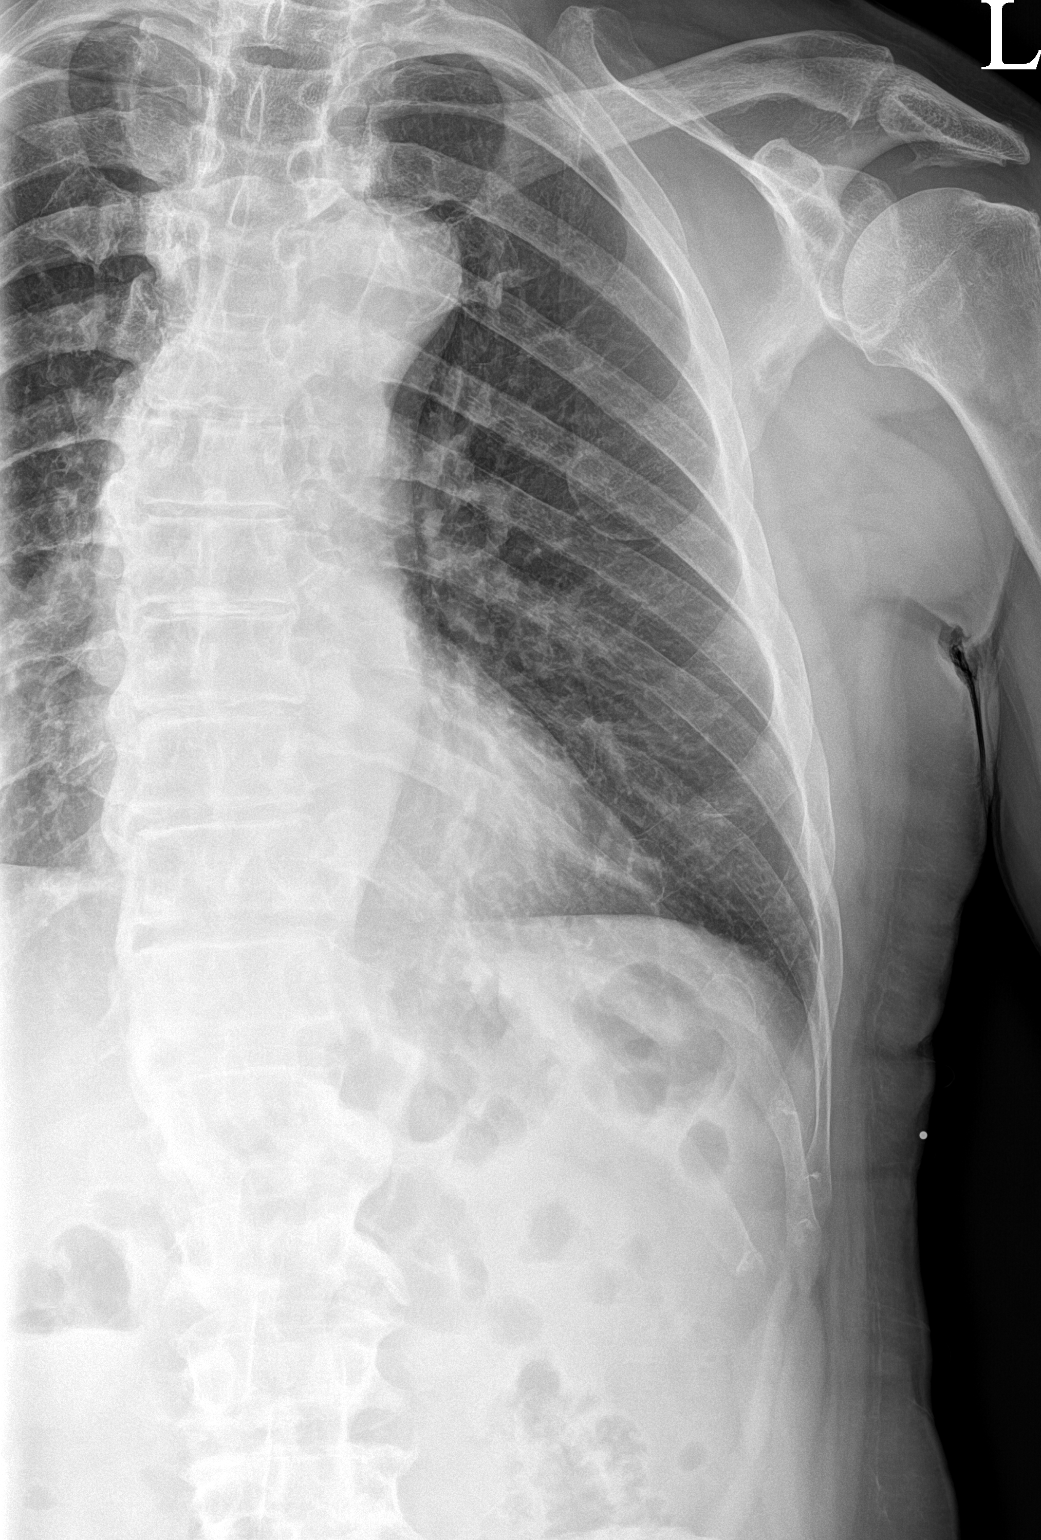

[rib obl]
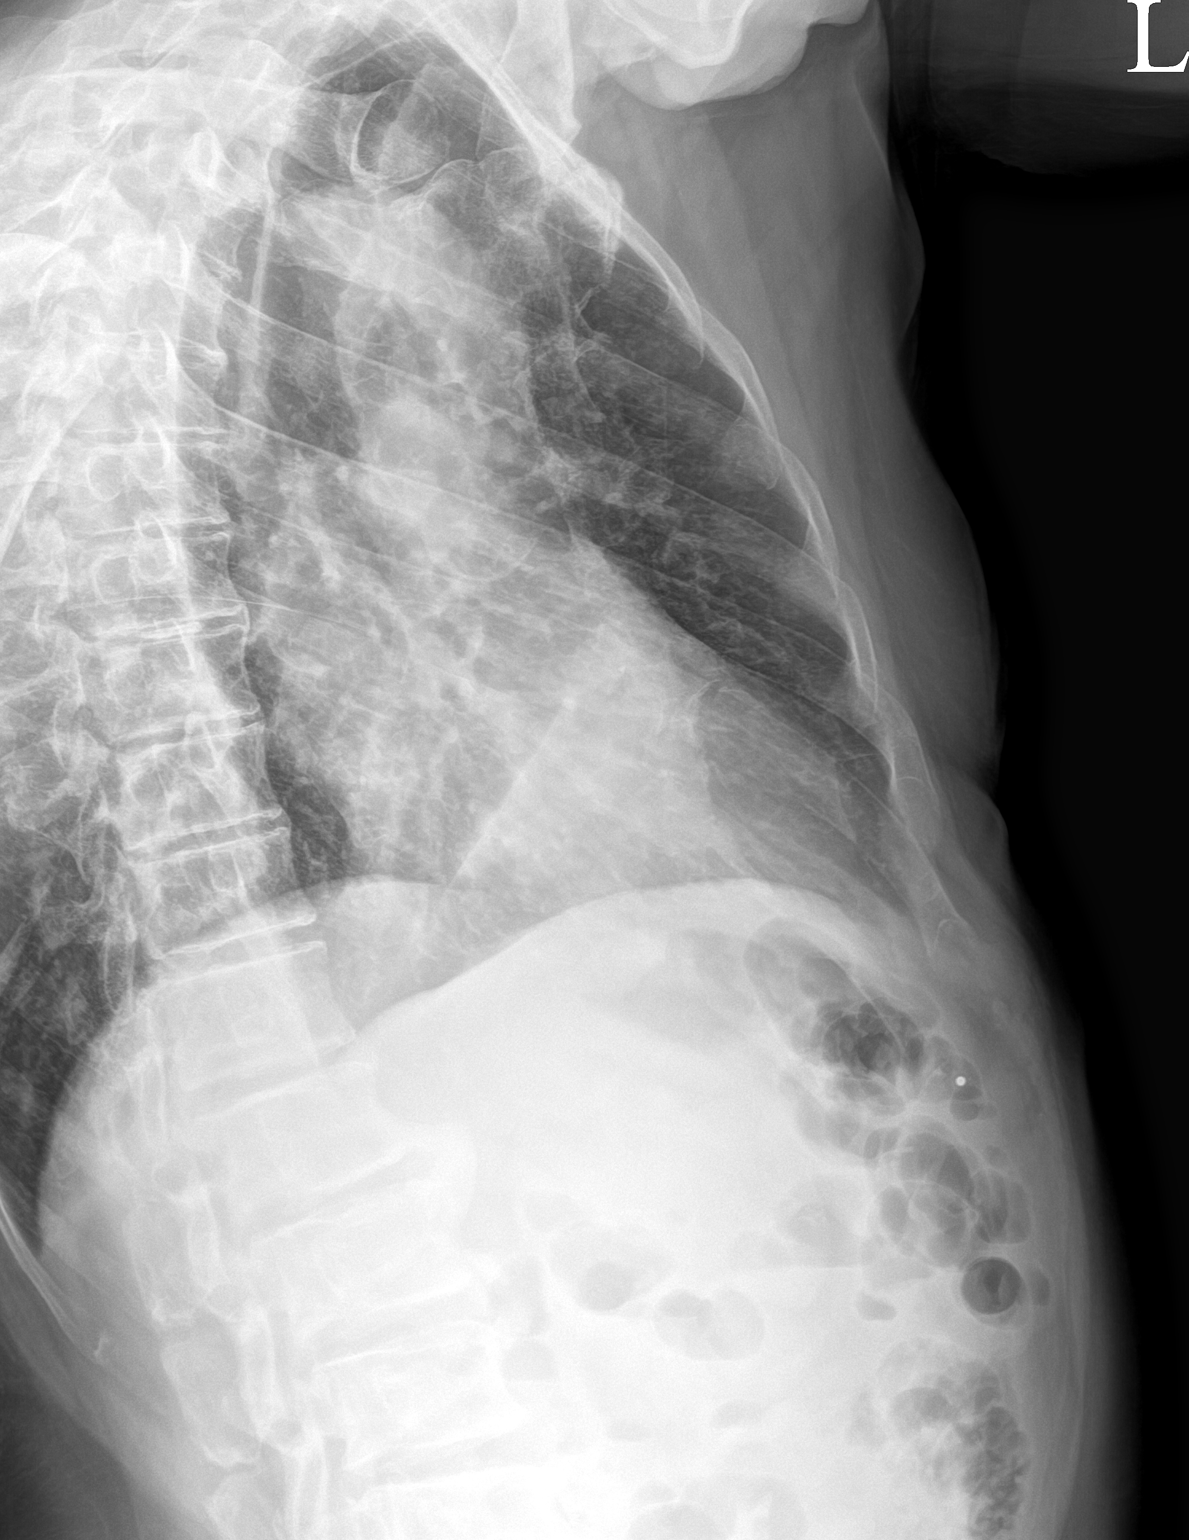

[3 of 3 positions shown; findings below may reference images not displayed]

FINDINGS: No fracture or other bone lesions are seen involving the ribs. There
is no evidence of pneumothorax or pleural effusion. Both lungs are
clear. Heart size and mediastinal contours are within normal limits.
IMPRESSION: No displaced fracture or other radiographic abnormality of the ribs
to explain pain.

## 2021-11-18 ENCOUNTER — Other Ambulatory Visit: Payer: Self-pay

## 2021-11-18 DIAGNOSIS — R972 Elevated prostate specific antigen [PSA]: Secondary | ICD-10-CM

## 2021-11-23 ENCOUNTER — Encounter: Payer: Self-pay | Admitting: Urology

## 2021-11-23 ENCOUNTER — Other Ambulatory Visit: Payer: Medicare Other

## 2021-11-25 ENCOUNTER — Ambulatory Visit: Payer: Medicare Other | Admitting: Urology

## 2021-11-25 NOTE — Progress Notes (Incomplete)
11/25/21 5:56 AM   Christopher Arroyo [birth year] [id number]  Referring provider:  Etta Grandchild, MD [address] [place],  [place] [address] No chief complaint on file.     HPI: Christopher Arroyo is a 76 y.o.male with a personal history of prostamegaly, BPH with outlet obstruction, urinary retention status post holep on 02/2021, and perineal pain who returns today for follow-up with UA, IPSS, PVR ,and PSA.   His postoperative course was complicated by suprapubic pain.  He was seen by Lionel December and managed for presumptive UTI (culture negative ) as well as Urogesic blue.   Surgical pathology was benign, 53.5 g resection.   His most recent PSA was 0.18 on 05/13/2021.    PMH: Past Medical History:  Diagnosis Date   Anxiety and depression    Current use of long term anticoagulation    DAPT (ASA + clopidogrel)   Diabetes mellitus without complication (HCC)    History of hiatal hernia    HLD (hyperlipidemia)    Hypertension    Hypothyroidism    PSA elevation    Right internal carotid artery aneurysm    s/p pipeline embolization on 08/08/2020   T2DM (type 2 diabetes mellitus) (HCC) 09/02/2020   TIA (transient ischemic attack) 2009   left hand weakness, decrease fine motor skills    Surgical History: Past Surgical History:  Procedure Laterality Date   CAROTID ANGIOGRAM Right 02/20/2021   CEREBRAL ANGIOGRAM N/A 04/24/2020   Location: [facility]   SP EMBOLIZATION INTRACRANIAL Right 08/08/2020   4.75 x 14 mm pipeline embolization device (PED) placement to RIGHT ICA cavernous segment aneurysm; Location: [facility]; Surgeon: Johnsie Cancel, MD   TONSILLECTOMY      Home Medications:  Allergies as of 11/25/2021   No Known Allergies      Medication List        Accurate as of Nov 25, 2021  5:56 AM. If you have any questions, ask your nurse or doctor.          amLODipine 5 MG tablet Commonly known as: NORVASC Take 1 tablet (5 mg total) by mouth daily.   aspirin EC 81 MG  tablet Take 81 mg by mouth daily. Swallow whole.   DIPHENHYDRAMINE HCL (TOPICAL) 2 % Gel Apply 1 application. topically in the morning and at bedtime.   dorzolamide-timolol 22.3-6.8 MG/ML ophthalmic solution Commonly known as: COSOPT Place 1 drop into both eyes 2 (two) times daily.   levothyroxine 75 MCG tablet Commonly known as: SYNTHROID Take 1 tablet (75 mcg total) by mouth daily before breakfast.   meloxicam 7.5 MG tablet Commonly known as: MOBIC meloxicam 7.5 mg tablet  TAKE 1 TABLET BY MOUTH EVERY DAY   methocarbamol 500 MG tablet Commonly known as: ROBAXIN Take 500 mg by mouth 2 (two) times daily as needed.   multivitamin with minerals Tabs tablet Take 1 tablet by mouth daily.   predniSONE 10 MG tablet Commonly known as: DELTASONE 3 tabs by mouth per day for 3 days,2tabs per day for 3 days,1tab per day for 3 days   rosuvastatin 20 MG tablet Commonly known as: CRESTOR Take 1 tablet (20 mg total) by mouth daily.   triamcinolone 55 MCG/ACT Aero nasal inhaler Commonly known as: NASACORT Place 2 sprays into the nose daily.   valsartan-hydrochlorothiazide 160-12.5 MG tablet Commonly known as: DIOVAN-HCT Take 1 tablet by mouth daily.   zolpidem 5 MG tablet Commonly known as: AMBIEN Take 1 tablet (5 mg total) by mouth at bedtime as needed  for sleep.        Allergies:  No Known Allergies  Family History: No family history on file.  Social History:  reports that he quit smoking about 39 years ago. His smoking use included cigarettes. He has never used smokeless tobacco. He reports that he does not currently use alcohol. 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   Physical Exam: There were no vitals taken for this visit.  Constitutional:  Alert and oriented, No acute distress. HEENT: [place] AT, moist mucus membranes.  Trachea midline, no masses. Cardiovascular: No clubbing, cyanosis, or edema. Respiratory:  Normal respiratory effort, no increased work of breathing. Skin: No rashes, bruises or suspicious lesions. Neurologic: Grossly intact, no focal deficits, moving all 4 extremities. Psychiatric: Normal mood and affect.  Laboratory Data:  Lab Results  Component Value Date   CREATININE 1.01 05/13/2021   Lab Results  Component Value Date   HGBA1C 5.9 02/05/2021    Urinalysis   Pertinent Imaging:    Assessment & Plan:     No follow-ups on file.  I,Kailey Littlejohn,acting as a Neurosurgeon for Vanna Scotland, MD.,have documented all relevant documentation on the behalf of Vanna Scotland, MD,as directed by  Vanna Scotland, MD while in the presence of Vanna Scotland, MD.   [facility] [address], [address] [place], [place] [address] [phone]

## 2021-11-26 ENCOUNTER — Encounter: Payer: Self-pay | Admitting: Urology

## 2021-11-26 ENCOUNTER — Other Ambulatory Visit: Payer: Medicare Other

## 2021-12-08 ENCOUNTER — Other Ambulatory Visit: Payer: Medicare HMO

## 2021-12-09 ENCOUNTER — Ambulatory Visit: Payer: Medicare HMO | Admitting: Urology

## 2021-12-09 NOTE — Progress Notes (Incomplete)
12/09/21 11:36 AM   Christopher Arroyo 08/04/1945 [id number]  Referring provider:  Georgina Quint, MD [address] [place],  [place] [address] No chief complaint on file.     HPI: Christopher Arroyo is a 76 y.o.male with a personal history of prostamegaly, BPH with outlet obstruction, urinary retention status post holep on 02/2021, and perineal pain who returns today for follow-up with IPSS, UA, PVR,and PSA.   Surgical pathology in 02/2021 was benign. 53.5g of prostate was resected during HoLEP.      Score:  1-7 Mild 8-19 Moderate 20-35 Severe   PMH: Past Medical History:  Diagnosis Date   Anxiety and depression    Current use of long term anticoagulation    DAPT (ASA + clopidogrel)   Diabetes mellitus without complication (HCC)    History of hiatal hernia    HLD (hyperlipidemia)    Hypertension    Hypothyroidism    PSA elevation    Right internal carotid artery aneurysm    s/p pipeline embolization on 08/08/2020   T2DM (type 2 diabetes mellitus) (HCC) 09/02/2020   TIA (transient ischemic attack) 2009   left hand weakness, decrease fine motor skills    Surgical History: Past Surgical History:  Procedure Laterality Date   CAROTID ANGIOGRAM Right 02/20/2021   CEREBRAL ANGIOGRAM N/A 04/24/2020   Location: [facility]   SP EMBOLIZATION INTRACRANIAL Right 08/08/2020   4.75 x 14 mm pipeline embolization device (PED) placement to RIGHT ICA cavernous segment aneurysm; Location: [facility]; Surgeon: Johnsie Cancel, MD   TONSILLECTOMY      Home Medications:  Allergies as of 12/09/2021   No Known Allergies      Medication List        Accurate as of December 09, 2021 11:36 AM. If you have any questions, ask your nurse or doctor.          amLODipine 5 MG tablet Commonly known as: NORVASC Take 1 tablet (5 mg total) by mouth daily.   aspirin EC 81 MG tablet Take 81 mg by mouth daily. Swallow whole.   DIPHENHYDRAMINE HCL (TOPICAL) 2 % Gel Apply 1 application. topically in the  morning and at bedtime.   dorzolamide-timolol 22.3-6.8 MG/ML ophthalmic solution Commonly known as: COSOPT Place 1 drop into both eyes 2 (two) times daily.   levothyroxine 75 MCG tablet Commonly known as: SYNTHROID Take 1 tablet (75 mcg total) by mouth daily before breakfast.   meloxicam 7.5 MG tablet Commonly known as: MOBIC meloxicam 7.5 mg tablet  TAKE 1 TABLET BY MOUTH EVERY DAY   methocarbamol 500 MG tablet Commonly known as: ROBAXIN Take 500 mg by mouth 2 (two) times daily as needed.   multivitamin with minerals Tabs tablet Take 1 tablet by mouth daily.   predniSONE 10 MG tablet Commonly known as: DELTASONE 3 tabs by mouth per day for 3 days,2tabs per day for 3 days,1tab per day for 3 days   rosuvastatin 20 MG tablet Commonly known as: CRESTOR Take 1 tablet (20 mg total) by mouth daily.   triamcinolone 55 MCG/ACT Aero nasal inhaler Commonly known as: NASACORT Place 2 sprays into the nose daily.   valsartan-hydrochlorothiazide 160-12.5 MG tablet Commonly known as: DIOVAN-HCT Take 1 tablet by mouth daily.   zolpidem 5 MG tablet Commonly known as: AMBIEN Take 1 tablet (5 mg total) by mouth at bedtime as needed for sleep.        Allergies:  No Known Allergies  Family History: No family history on file.  Social  History:  reports that he quit smoking about 39 years ago. His smoking use included cigarettes. He has never used smokeless tobacco. He reports that he does not currently use alcohol. 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   Physical Exam: There were no vitals taken for this visit.  Constitutional:  Alert and oriented, No acute distress. HEENT: [place] AT, moist mucus membranes.  Trachea midline, no masses. Cardiovascular: No clubbing, cyanosis, or edema. Respiratory: Normal respiratory effort, no increased work of breathing. Skin: No rashes, bruises or suspicious lesions. Neurologic: Grossly  intact, no focal deficits, moving all 4 extremities. Psychiatric: Normal mood and affect.  Laboratory Data:  Lab Results  Component Value Date   CREATININE 1.01 05/13/2021   Lab Results  Component Value Date   HGBA1C 5.9 02/05/2021    Urinalysis   Pertinent Imaging: No results found for any visits on 12/09/21.    Assessment & Plan:     No follow-ups on file.  I,Kailey Littlejohn,acting as a Neurosurgeon for Vanna Scotland, MD.,have documented all relevant documentation on the behalf of Vanna Scotland, MD,as directed by  Vanna Scotland, MD while in the presence of Vanna Scotland, MD.   [facility] [address] [place], [place] [address] [phone]

## 2021-12-11 ENCOUNTER — Encounter: Payer: Self-pay | Admitting: Urology

## 2021-12-30 DIAGNOSIS — M5136 Other intervertebral disc degeneration, lumbar region: Secondary | ICD-10-CM | POA: Diagnosis not present

## 2021-12-30 DIAGNOSIS — M9905 Segmental and somatic dysfunction of pelvic region: Secondary | ICD-10-CM | POA: Diagnosis not present

## 2021-12-30 DIAGNOSIS — M9903 Segmental and somatic dysfunction of lumbar region: Secondary | ICD-10-CM | POA: Diagnosis not present

## 2021-12-30 DIAGNOSIS — M545 Low back pain, unspecified: Secondary | ICD-10-CM | POA: Diagnosis not present

## 2021-12-31 DIAGNOSIS — M545 Low back pain, unspecified: Secondary | ICD-10-CM | POA: Diagnosis not present

## 2021-12-31 DIAGNOSIS — M9905 Segmental and somatic dysfunction of pelvic region: Secondary | ICD-10-CM | POA: Diagnosis not present

## 2021-12-31 DIAGNOSIS — M9903 Segmental and somatic dysfunction of lumbar region: Secondary | ICD-10-CM | POA: Diagnosis not present

## 2021-12-31 DIAGNOSIS — M5136 Other intervertebral disc degeneration, lumbar region: Secondary | ICD-10-CM | POA: Diagnosis not present

## 2022-01-01 DIAGNOSIS — M5136 Other intervertebral disc degeneration, lumbar region: Secondary | ICD-10-CM | POA: Diagnosis not present

## 2022-01-01 DIAGNOSIS — M545 Low back pain, unspecified: Secondary | ICD-10-CM | POA: Diagnosis not present

## 2022-01-01 DIAGNOSIS — M9903 Segmental and somatic dysfunction of lumbar region: Secondary | ICD-10-CM | POA: Diagnosis not present

## 2022-01-01 DIAGNOSIS — M9905 Segmental and somatic dysfunction of pelvic region: Secondary | ICD-10-CM | POA: Diagnosis not present

## 2022-01-04 DIAGNOSIS — M9905 Segmental and somatic dysfunction of pelvic region: Secondary | ICD-10-CM | POA: Diagnosis not present

## 2022-01-04 DIAGNOSIS — M545 Low back pain, unspecified: Secondary | ICD-10-CM | POA: Diagnosis not present

## 2022-01-04 DIAGNOSIS — M9903 Segmental and somatic dysfunction of lumbar region: Secondary | ICD-10-CM | POA: Diagnosis not present

## 2022-01-04 DIAGNOSIS — M5136 Other intervertebral disc degeneration, lumbar region: Secondary | ICD-10-CM | POA: Diagnosis not present

## 2022-01-06 DIAGNOSIS — M545 Low back pain, unspecified: Secondary | ICD-10-CM | POA: Diagnosis not present

## 2022-01-06 DIAGNOSIS — M9903 Segmental and somatic dysfunction of lumbar region: Secondary | ICD-10-CM | POA: Diagnosis not present

## 2022-01-06 DIAGNOSIS — M9905 Segmental and somatic dysfunction of pelvic region: Secondary | ICD-10-CM | POA: Diagnosis not present

## 2022-01-06 DIAGNOSIS — M5136 Other intervertebral disc degeneration, lumbar region: Secondary | ICD-10-CM | POA: Diagnosis not present

## 2022-01-07 DIAGNOSIS — M9905 Segmental and somatic dysfunction of pelvic region: Secondary | ICD-10-CM | POA: Diagnosis not present

## 2022-01-07 DIAGNOSIS — M9903 Segmental and somatic dysfunction of lumbar region: Secondary | ICD-10-CM | POA: Diagnosis not present

## 2022-01-07 DIAGNOSIS — M5136 Other intervertebral disc degeneration, lumbar region: Secondary | ICD-10-CM | POA: Diagnosis not present

## 2022-01-07 DIAGNOSIS — M545 Low back pain, unspecified: Secondary | ICD-10-CM | POA: Diagnosis not present

## 2022-01-11 DIAGNOSIS — M9903 Segmental and somatic dysfunction of lumbar region: Secondary | ICD-10-CM | POA: Diagnosis not present

## 2022-01-11 DIAGNOSIS — M545 Low back pain, unspecified: Secondary | ICD-10-CM | POA: Diagnosis not present

## 2022-01-11 DIAGNOSIS — M9905 Segmental and somatic dysfunction of pelvic region: Secondary | ICD-10-CM | POA: Diagnosis not present

## 2022-01-11 DIAGNOSIS — M5136 Other intervertebral disc degeneration, lumbar region: Secondary | ICD-10-CM | POA: Diagnosis not present

## 2022-01-13 DIAGNOSIS — M9903 Segmental and somatic dysfunction of lumbar region: Secondary | ICD-10-CM | POA: Diagnosis not present

## 2022-01-13 DIAGNOSIS — M5136 Other intervertebral disc degeneration, lumbar region: Secondary | ICD-10-CM | POA: Diagnosis not present

## 2022-01-13 DIAGNOSIS — M545 Low back pain, unspecified: Secondary | ICD-10-CM | POA: Diagnosis not present

## 2022-01-13 DIAGNOSIS — M9905 Segmental and somatic dysfunction of pelvic region: Secondary | ICD-10-CM | POA: Diagnosis not present

## 2022-01-14 DIAGNOSIS — M545 Low back pain, unspecified: Secondary | ICD-10-CM | POA: Diagnosis not present

## 2022-01-14 DIAGNOSIS — M5136 Other intervertebral disc degeneration, lumbar region: Secondary | ICD-10-CM | POA: Diagnosis not present

## 2022-01-14 DIAGNOSIS — M9903 Segmental and somatic dysfunction of lumbar region: Secondary | ICD-10-CM | POA: Diagnosis not present

## 2022-01-14 DIAGNOSIS — M9905 Segmental and somatic dysfunction of pelvic region: Secondary | ICD-10-CM | POA: Diagnosis not present

## 2022-01-18 ENCOUNTER — Telehealth: Payer: Self-pay | Admitting: Emergency Medicine

## 2022-01-18 ENCOUNTER — Other Ambulatory Visit: Payer: Self-pay | Admitting: Emergency Medicine

## 2022-01-18 DIAGNOSIS — Z0100 Encounter for examination of eyes and vision without abnormal findings: Secondary | ICD-10-CM

## 2022-01-18 DIAGNOSIS — M545 Low back pain, unspecified: Secondary | ICD-10-CM | POA: Diagnosis not present

## 2022-01-18 DIAGNOSIS — M9903 Segmental and somatic dysfunction of lumbar region: Secondary | ICD-10-CM | POA: Diagnosis not present

## 2022-01-18 DIAGNOSIS — M5136 Other intervertebral disc degeneration, lumbar region: Secondary | ICD-10-CM | POA: Diagnosis not present

## 2022-01-18 DIAGNOSIS — M9905 Segmental and somatic dysfunction of pelvic region: Secondary | ICD-10-CM | POA: Diagnosis not present

## 2022-01-18 MED ORDER — DORZOLAMIDE HCL-TIMOLOL MAL 2-0.5 % OP SOLN: 1.0000 [drp] | Freq: Two times a day (BID) | OPHTHALMIC | 3 refills | Status: DC

## 2022-01-18 NOTE — Telephone Encounter (Signed)
Caller & Relationship to patient: Christopher Arroyo - self  Call back number: [phone]  Date of last office visit: 10-01-21  Date of next office visit:   Medication(s) to be refilled:  dorzolamide-timolol (COSOPT) 22.3-6.8 MG/ML ophthalmic solution      [facility]acy: [id number] [place], [place] - [address] Phone:  [phone]  Fax:  [phone]     Patient states that his eye doctor has retired and he is wondering if Dr. Alvy Bimler will prescribe these for him. Please call him to let him know if we are able to refill.

## 2022-01-18 NOTE — Telephone Encounter (Signed)
I will prescribe these eyedrops to help him out, but he needs to also follow-up with new ophthalmologist.  Thanks.

## 2022-01-19 NOTE — Telephone Encounter (Signed)
Called patient and informed patient that the medication was sent to his pharmacy. Patient requested a referral to ophthalmology. Referral has been placed.

## 2022-01-19 NOTE — Telephone Encounter (Signed)
Thank you :)

## 2022-01-20 DIAGNOSIS — M545 Low back pain, unspecified: Secondary | ICD-10-CM | POA: Diagnosis not present

## 2022-01-20 DIAGNOSIS — M9903 Segmental and somatic dysfunction of lumbar region: Secondary | ICD-10-CM | POA: Diagnosis not present

## 2022-01-20 DIAGNOSIS — M9905 Segmental and somatic dysfunction of pelvic region: Secondary | ICD-10-CM | POA: Diagnosis not present

## 2022-01-20 DIAGNOSIS — M5136 Other intervertebral disc degeneration, lumbar region: Secondary | ICD-10-CM | POA: Diagnosis not present

## 2022-01-22 DIAGNOSIS — M545 Low back pain, unspecified: Secondary | ICD-10-CM | POA: Diagnosis not present

## 2022-01-22 DIAGNOSIS — M9903 Segmental and somatic dysfunction of lumbar region: Secondary | ICD-10-CM | POA: Diagnosis not present

## 2022-01-22 DIAGNOSIS — M9905 Segmental and somatic dysfunction of pelvic region: Secondary | ICD-10-CM | POA: Diagnosis not present

## 2022-01-22 DIAGNOSIS — M5136 Other intervertebral disc degeneration, lumbar region: Secondary | ICD-10-CM | POA: Diagnosis not present

## 2022-01-26 DIAGNOSIS — M9903 Segmental and somatic dysfunction of lumbar region: Secondary | ICD-10-CM | POA: Diagnosis not present

## 2022-01-26 DIAGNOSIS — M9905 Segmental and somatic dysfunction of pelvic region: Secondary | ICD-10-CM | POA: Diagnosis not present

## 2022-01-26 DIAGNOSIS — M5136 Other intervertebral disc degeneration, lumbar region: Secondary | ICD-10-CM | POA: Diagnosis not present

## 2022-01-26 DIAGNOSIS — M545 Low back pain, unspecified: Secondary | ICD-10-CM | POA: Diagnosis not present

## 2022-01-28 DIAGNOSIS — M5136 Other intervertebral disc degeneration, lumbar region: Secondary | ICD-10-CM | POA: Diagnosis not present

## 2022-01-28 DIAGNOSIS — M545 Low back pain, unspecified: Secondary | ICD-10-CM | POA: Diagnosis not present

## 2022-01-28 DIAGNOSIS — M9903 Segmental and somatic dysfunction of lumbar region: Secondary | ICD-10-CM | POA: Diagnosis not present

## 2022-01-28 DIAGNOSIS — M9905 Segmental and somatic dysfunction of pelvic region: Secondary | ICD-10-CM | POA: Diagnosis not present

## 2022-02-02 DIAGNOSIS — M5136 Other intervertebral disc degeneration, lumbar region: Secondary | ICD-10-CM | POA: Diagnosis not present

## 2022-02-02 DIAGNOSIS — M9905 Segmental and somatic dysfunction of pelvic region: Secondary | ICD-10-CM | POA: Diagnosis not present

## 2022-02-02 DIAGNOSIS — M545 Low back pain, unspecified: Secondary | ICD-10-CM | POA: Diagnosis not present

## 2022-02-02 DIAGNOSIS — M9903 Segmental and somatic dysfunction of lumbar region: Secondary | ICD-10-CM | POA: Diagnosis not present

## 2022-02-04 DIAGNOSIS — M545 Low back pain, unspecified: Secondary | ICD-10-CM | POA: Diagnosis not present

## 2022-02-04 DIAGNOSIS — M9903 Segmental and somatic dysfunction of lumbar region: Secondary | ICD-10-CM | POA: Diagnosis not present

## 2022-02-04 DIAGNOSIS — M5136 Other intervertebral disc degeneration, lumbar region: Secondary | ICD-10-CM | POA: Diagnosis not present

## 2022-02-04 DIAGNOSIS — M9905 Segmental and somatic dysfunction of pelvic region: Secondary | ICD-10-CM | POA: Diagnosis not present

## 2022-02-11 DIAGNOSIS — M9905 Segmental and somatic dysfunction of pelvic region: Secondary | ICD-10-CM | POA: Diagnosis not present

## 2022-02-11 DIAGNOSIS — M545 Low back pain, unspecified: Secondary | ICD-10-CM | POA: Diagnosis not present

## 2022-02-11 DIAGNOSIS — M9903 Segmental and somatic dysfunction of lumbar region: Secondary | ICD-10-CM | POA: Diagnosis not present

## 2022-02-11 DIAGNOSIS — M5136 Other intervertebral disc degeneration, lumbar region: Secondary | ICD-10-CM | POA: Diagnosis not present

## 2022-02-17 ENCOUNTER — Other Ambulatory Visit: Payer: Self-pay | Admitting: Emergency Medicine

## 2022-02-17 DIAGNOSIS — I119 Hypertensive heart disease without heart failure: Secondary | ICD-10-CM

## 2022-02-17 DIAGNOSIS — I10 Essential (primary) hypertension: Secondary | ICD-10-CM

## 2022-02-18 DIAGNOSIS — M9903 Segmental and somatic dysfunction of lumbar region: Secondary | ICD-10-CM | POA: Diagnosis not present

## 2022-02-18 DIAGNOSIS — M9905 Segmental and somatic dysfunction of pelvic region: Secondary | ICD-10-CM | POA: Diagnosis not present

## 2022-02-18 DIAGNOSIS — M545 Low back pain, unspecified: Secondary | ICD-10-CM | POA: Diagnosis not present

## 2022-02-18 DIAGNOSIS — M5136 Other intervertebral disc degeneration, lumbar region: Secondary | ICD-10-CM | POA: Diagnosis not present

## 2022-02-23 DIAGNOSIS — M9903 Segmental and somatic dysfunction of lumbar region: Secondary | ICD-10-CM | POA: Diagnosis not present

## 2022-02-23 DIAGNOSIS — M545 Low back pain, unspecified: Secondary | ICD-10-CM | POA: Diagnosis not present

## 2022-02-23 DIAGNOSIS — M9905 Segmental and somatic dysfunction of pelvic region: Secondary | ICD-10-CM | POA: Diagnosis not present

## 2022-02-23 DIAGNOSIS — M5136 Other intervertebral disc degeneration, lumbar region: Secondary | ICD-10-CM | POA: Diagnosis not present

## 2022-03-02 DIAGNOSIS — M5136 Other intervertebral disc degeneration, lumbar region: Secondary | ICD-10-CM | POA: Diagnosis not present

## 2022-03-02 DIAGNOSIS — M9905 Segmental and somatic dysfunction of pelvic region: Secondary | ICD-10-CM | POA: Diagnosis not present

## 2022-03-02 DIAGNOSIS — M9903 Segmental and somatic dysfunction of lumbar region: Secondary | ICD-10-CM | POA: Diagnosis not present

## 2022-03-02 DIAGNOSIS — M545 Low back pain, unspecified: Secondary | ICD-10-CM | POA: Diagnosis not present

## 2022-03-09 DIAGNOSIS — M545 Low back pain, unspecified: Secondary | ICD-10-CM | POA: Diagnosis not present

## 2022-03-09 DIAGNOSIS — M5136 Other intervertebral disc degeneration, lumbar region: Secondary | ICD-10-CM | POA: Diagnosis not present

## 2022-03-09 DIAGNOSIS — M9905 Segmental and somatic dysfunction of pelvic region: Secondary | ICD-10-CM | POA: Diagnosis not present

## 2022-03-09 DIAGNOSIS — M9903 Segmental and somatic dysfunction of lumbar region: Secondary | ICD-10-CM | POA: Diagnosis not present

## 2022-03-17 DIAGNOSIS — M5136 Other intervertebral disc degeneration, lumbar region: Secondary | ICD-10-CM | POA: Diagnosis not present

## 2022-03-17 DIAGNOSIS — M9905 Segmental and somatic dysfunction of pelvic region: Secondary | ICD-10-CM | POA: Diagnosis not present

## 2022-03-17 DIAGNOSIS — M9903 Segmental and somatic dysfunction of lumbar region: Secondary | ICD-10-CM | POA: Diagnosis not present

## 2022-03-17 DIAGNOSIS — M545 Low back pain, unspecified: Secondary | ICD-10-CM | POA: Diagnosis not present

## 2022-03-24 DIAGNOSIS — M9905 Segmental and somatic dysfunction of pelvic region: Secondary | ICD-10-CM | POA: Diagnosis not present

## 2022-03-24 DIAGNOSIS — M5136 Other intervertebral disc degeneration, lumbar region: Secondary | ICD-10-CM | POA: Diagnosis not present

## 2022-03-24 DIAGNOSIS — M9903 Segmental and somatic dysfunction of lumbar region: Secondary | ICD-10-CM | POA: Diagnosis not present

## 2022-03-24 DIAGNOSIS — M545 Low back pain, unspecified: Secondary | ICD-10-CM | POA: Diagnosis not present

## 2022-04-05 DIAGNOSIS — M9905 Segmental and somatic dysfunction of pelvic region: Secondary | ICD-10-CM | POA: Diagnosis not present

## 2022-04-05 DIAGNOSIS — M9903 Segmental and somatic dysfunction of lumbar region: Secondary | ICD-10-CM | POA: Diagnosis not present

## 2022-04-05 DIAGNOSIS — M545 Low back pain, unspecified: Secondary | ICD-10-CM | POA: Diagnosis not present

## 2022-04-05 DIAGNOSIS — M5136 Other intervertebral disc degeneration, lumbar region: Secondary | ICD-10-CM | POA: Diagnosis not present

## 2022-04-13 DIAGNOSIS — M5136 Other intervertebral disc degeneration, lumbar region: Secondary | ICD-10-CM | POA: Diagnosis not present

## 2022-04-13 DIAGNOSIS — M9905 Segmental and somatic dysfunction of pelvic region: Secondary | ICD-10-CM | POA: Diagnosis not present

## 2022-04-13 DIAGNOSIS — M545 Low back pain, unspecified: Secondary | ICD-10-CM | POA: Diagnosis not present

## 2022-04-13 DIAGNOSIS — M9903 Segmental and somatic dysfunction of lumbar region: Secondary | ICD-10-CM | POA: Diagnosis not present

## 2022-04-20 DIAGNOSIS — M545 Low back pain, unspecified: Secondary | ICD-10-CM | POA: Diagnosis not present

## 2022-04-20 DIAGNOSIS — M5136 Other intervertebral disc degeneration, lumbar region: Secondary | ICD-10-CM | POA: Diagnosis not present

## 2022-04-20 DIAGNOSIS — M9905 Segmental and somatic dysfunction of pelvic region: Secondary | ICD-10-CM | POA: Diagnosis not present

## 2022-04-20 DIAGNOSIS — M9903 Segmental and somatic dysfunction of lumbar region: Secondary | ICD-10-CM | POA: Diagnosis not present

## 2022-04-23 DIAGNOSIS — Z8673 Personal history of transient ischemic attack (TIA), and cerebral infarction without residual deficits: Secondary | ICD-10-CM | POA: Diagnosis not present

## 2022-04-23 DIAGNOSIS — Z604 Social exclusion and rejection: Secondary | ICD-10-CM | POA: Diagnosis not present

## 2022-04-23 DIAGNOSIS — Z7984 Long term (current) use of oral hypoglycemic drugs: Secondary | ICD-10-CM | POA: Diagnosis not present

## 2022-04-23 DIAGNOSIS — R32 Unspecified urinary incontinence: Secondary | ICD-10-CM | POA: Diagnosis not present

## 2022-04-23 DIAGNOSIS — R7303 Prediabetes: Secondary | ICD-10-CM | POA: Diagnosis not present

## 2022-04-23 DIAGNOSIS — I10 Essential (primary) hypertension: Secondary | ICD-10-CM | POA: Diagnosis not present

## 2022-04-23 DIAGNOSIS — R69 Illness, unspecified: Secondary | ICD-10-CM | POA: Diagnosis not present

## 2022-04-23 DIAGNOSIS — Z008 Encounter for other general examination: Secondary | ICD-10-CM | POA: Diagnosis not present

## 2022-04-23 DIAGNOSIS — G8929 Other chronic pain: Secondary | ICD-10-CM | POA: Diagnosis not present

## 2022-04-23 DIAGNOSIS — Z87891 Personal history of nicotine dependence: Secondary | ICD-10-CM | POA: Diagnosis not present

## 2022-05-06 DIAGNOSIS — M9905 Segmental and somatic dysfunction of pelvic region: Secondary | ICD-10-CM | POA: Diagnosis not present

## 2022-05-06 DIAGNOSIS — M9903 Segmental and somatic dysfunction of lumbar region: Secondary | ICD-10-CM | POA: Diagnosis not present

## 2022-05-06 DIAGNOSIS — M545 Low back pain, unspecified: Secondary | ICD-10-CM | POA: Diagnosis not present

## 2022-05-06 DIAGNOSIS — M5136 Other intervertebral disc degeneration, lumbar region: Secondary | ICD-10-CM | POA: Diagnosis not present

## 2022-05-20 DIAGNOSIS — M5136 Other intervertebral disc degeneration, lumbar region: Secondary | ICD-10-CM | POA: Diagnosis not present

## 2022-05-20 DIAGNOSIS — M9903 Segmental and somatic dysfunction of lumbar region: Secondary | ICD-10-CM | POA: Diagnosis not present

## 2022-05-20 DIAGNOSIS — M9905 Segmental and somatic dysfunction of pelvic region: Secondary | ICD-10-CM | POA: Diagnosis not present

## 2022-05-20 DIAGNOSIS — M545 Low back pain, unspecified: Secondary | ICD-10-CM | POA: Diagnosis not present

## 2022-06-03 DIAGNOSIS — M9903 Segmental and somatic dysfunction of lumbar region: Secondary | ICD-10-CM | POA: Diagnosis not present

## 2022-06-03 DIAGNOSIS — M9905 Segmental and somatic dysfunction of pelvic region: Secondary | ICD-10-CM | POA: Diagnosis not present

## 2022-06-03 DIAGNOSIS — M545 Low back pain, unspecified: Secondary | ICD-10-CM | POA: Diagnosis not present

## 2022-06-03 DIAGNOSIS — M5136 Other intervertebral disc degeneration, lumbar region: Secondary | ICD-10-CM | POA: Diagnosis not present

## 2022-06-07 ENCOUNTER — Other Ambulatory Visit: Payer: Self-pay | Admitting: Emergency Medicine

## 2022-06-07 DIAGNOSIS — I10 Essential (primary) hypertension: Secondary | ICD-10-CM

## 2022-06-21 DIAGNOSIS — M545 Low back pain, unspecified: Secondary | ICD-10-CM | POA: Diagnosis not present

## 2022-06-21 DIAGNOSIS — M9903 Segmental and somatic dysfunction of lumbar region: Secondary | ICD-10-CM | POA: Diagnosis not present

## 2022-06-21 DIAGNOSIS — M9905 Segmental and somatic dysfunction of pelvic region: Secondary | ICD-10-CM | POA: Diagnosis not present

## 2022-06-21 DIAGNOSIS — M5136 Other intervertebral disc degeneration, lumbar region: Secondary | ICD-10-CM | POA: Diagnosis not present

## 2022-07-20 ENCOUNTER — Other Ambulatory Visit: Payer: Self-pay | Admitting: Emergency Medicine

## 2022-08-05 DIAGNOSIS — M9903 Segmental and somatic dysfunction of lumbar region: Secondary | ICD-10-CM | POA: Diagnosis not present

## 2022-08-05 DIAGNOSIS — M545 Low back pain, unspecified: Secondary | ICD-10-CM | POA: Diagnosis not present

## 2022-08-05 DIAGNOSIS — M5136 Other intervertebral disc degeneration, lumbar region: Secondary | ICD-10-CM | POA: Diagnosis not present

## 2022-08-05 DIAGNOSIS — M9905 Segmental and somatic dysfunction of pelvic region: Secondary | ICD-10-CM | POA: Diagnosis not present

## 2022-08-16 ENCOUNTER — Other Ambulatory Visit: Payer: Self-pay

## 2022-08-16 ENCOUNTER — Emergency Department: Payer: Medicare HMO

## 2022-08-16 ENCOUNTER — Emergency Department
Admission: EM | Admit: 2022-08-16 | Discharge: 2022-08-16 | Disposition: A | Discharge status: Home / Self Care | Payer: Medicare HMO | Attending: Emergency Medicine | Admitting: Emergency Medicine

## 2022-08-16 ENCOUNTER — Encounter: Payer: Self-pay | Admitting: Emergency Medicine

## 2022-08-16 DIAGNOSIS — Z8673 Personal history of transient ischemic attack (TIA), and cerebral infarction without residual deficits: Secondary | ICD-10-CM | POA: Diagnosis not present

## 2022-08-16 DIAGNOSIS — R7989 Other specified abnormal findings of blood chemistry: Secondary | ICD-10-CM | POA: Insufficient documentation

## 2022-08-16 DIAGNOSIS — R0789 Other chest pain: Secondary | ICD-10-CM | POA: Diagnosis not present

## 2022-08-16 DIAGNOSIS — E119 Type 2 diabetes mellitus without complications: Secondary | ICD-10-CM | POA: Insufficient documentation

## 2022-08-16 DIAGNOSIS — E039 Hypothyroidism, unspecified: Secondary | ICD-10-CM | POA: Insufficient documentation

## 2022-08-16 DIAGNOSIS — R0600 Dyspnea, unspecified: Secondary | ICD-10-CM | POA: Insufficient documentation

## 2022-08-16 DIAGNOSIS — J439 Emphysema, unspecified: Secondary | ICD-10-CM | POA: Diagnosis not present

## 2022-08-16 DIAGNOSIS — R079 Chest pain, unspecified: Secondary | ICD-10-CM | POA: Diagnosis not present

## 2022-08-16 LAB — CBC
HCT: 42.1 % (ref 39.0–52.0)
Hemoglobin: 14.4 g/dL (ref 13.0–17.0)
MCH: 29.4 pg (ref 26.0–34.0)
MCHC: 34.2 g/dL (ref 30.0–36.0)
MCV: 85.9 fL (ref 80.0–100.0)
Platelets: 149 10*3/uL — ABNORMAL LOW (ref 150–400)
RBC: 4.9 MIL/uL (ref 4.22–5.81)
RDW: 14.6 % (ref 11.5–15.5)
WBC: 7.9 10*3/uL (ref 4.0–10.5)
nRBC: 0 % (ref 0.0–0.2)

## 2022-08-16 LAB — BASIC METABOLIC PANEL
Anion gap: 11 (ref 5–15)
BUN: 9 mg/dL (ref 8–23)
CO2: 24 mmol/L (ref 22–32)
Calcium: 9.4 mg/dL (ref 8.9–10.3)
Chloride: 103 mmol/L (ref 98–111)
Creatinine, Ser: 1.03 mg/dL (ref 0.61–1.24)
GFR, Estimated: >60 mL/min (ref >60)
Glucose, Bld: 110 mg/dL — ABNORMAL HIGH (ref 70–99)
Potassium: 3.8 mmol/L (ref 3.5–5.1)
Sodium: 138 mmol/L (ref 135–145)

## 2022-08-16 LAB — HEPATIC FUNCTION PANEL
ALT: 17 U/L (ref 0–44)
AST: 28 U/L (ref 15–41)
Albumin: 4.5 g/dL (ref 3.5–5.0)
Alkaline Phosphatase: 39 U/L (ref 38–126)
Bilirubin, Direct: 0.2 mg/dL (ref 0.0–0.2)
Indirect Bilirubin: 0.5 mg/dL (ref 0.3–0.9)
Total Bilirubin: 0.7 mg/dL (ref 0.3–1.2)
Total Protein: 8.1 g/dL (ref 6.5–8.1)

## 2022-08-16 LAB — LIPASE, BLOOD: Lipase: 38 U/L (ref 11–51)

## 2022-08-16 LAB — BRAIN NATRIURETIC PEPTIDE: B Natriuretic Peptide: 20.2 pg/mL (ref 0.0–100.0)

## 2022-08-16 LAB — TROPONIN I (HIGH SENSITIVITY)
Troponin I (High Sensitivity): 10 ng/L (ref <18)
Troponin I (High Sensitivity): 12 ng/L (ref <18)

## 2022-08-16 LAB — D-DIMER, QUANTITATIVE: D-Dimer, Quant: 0.83 ug/mL-FEU — ABNORMAL HIGH (ref 0.00–0.50)

## 2022-08-16 MED ORDER — ASPIRIN 81 MG PO CHEW
324.0000 mg | CHEWABLE_TABLET | Freq: Once | ORAL | Status: AC
  Administered 2022-08-16: 324 mg via ORAL
  Filled 2022-08-16: qty 4

## 2022-08-16 MED ORDER — IOHEXOL 350 MG/ML SOLN
75.0000 mL | Freq: Once | INTRAVENOUS | Status: AC | PRN
  Administered 2022-08-16: 75 mL via INTRAVENOUS

## 2022-08-16 NOTE — Discharge Instructions (Signed)
Your lab tests and CT scan do not show any acute issues.  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

## 2022-08-16 NOTE — ED Notes (Signed)
Patient transported to CT 

## 2022-08-16 NOTE — ED Notes (Signed)
Pt ambulatory upon discharge, pt verbalized understanding of discharge paperwork and follow up care.

## 2022-08-16 NOTE — ED Triage Notes (Signed)
Pt to ED for chest pain for past few days, reports worsening with exertion and today. Shob with exertion today.  Denies n/v NAD noted, RR even and unlabored.  Pt rpeorts under a lot of stress with wife in nursing home

## 2022-08-16 NOTE — ED Provider Notes (Signed)
[facility] Provider Note    Event Date/Time   First MD Initiated Contact with Patient 08/16/22 1346     (approximate)   History   Chest Pain   HPI  Christopher Arroyo is a 77 y.o. male   Past medical history of pretension, type 2 diabetes, TIA, hyper lipidemia, hypothyroid who presents to the emergency department with right-sided chest pain for the last 3 days that is worse with deep breaths.  Nonexertional nonradiating.  No respiratory infectious symptoms.  No trauma.  Denies any other acute medical complaints.  He had influenza about 3 weeks ago and it has completely resolved, towards the end of that course he developed some mild right-sided chest pain that he thinks may have lingered and caused this pain.  Over the last several days he seems to have noticed it more.  He has no leg pain or swelling.  Independent Historian contributed to assessment above: Daughter at bedside        Physical Exam   Triage Vital Signs: ED Triage Vitals  Enc Vitals Group     BP 08/16/22 1334 (!) 172/103     Pulse Rate 08/16/22 1334 73     Resp 08/16/22 1334 18     Temp 08/16/22 1334 97.6 F (36.4 C)     Temp src --      SpO2 08/16/22 1334 100 %     Weight 08/16/22 1336 154 lb (69.9 kg)     Height 08/16/22 1336 5' 8"$  (1.727 m)     Head Circumference --      Peak Flow --      Pain Score 08/16/22 1335 5     Pain Loc --      Pain Edu? --      Excl. in [place]? --     Most recent vital signs: Vitals:   08/16/22 1430 08/16/22 1518  BP: (!) 149/98 (!) 152/100  Pulse: 63 66  Resp: 17 11  Temp:    SpO2: 98% 99%    General: Awake, no distress.  CV:  Good peripheral perfusion.  Resp:  Normal effort.  Abd:  No distention.  Other:  Wake alert oriented comfortable appearing nontoxic with hypertension 170/100 otherwise vital signs within normal limits.  No chest wall tenderness to palpation and lungs are clear to auscultation bilaterally without focalities or wheezing  abdomen soft nontender deep palpation all quadrants and he has equal radial pulses bilaterally.   ED Results / Procedures / Treatments   Labs (all labs ordered are listed, but only abnormal results are displayed) Labs Reviewed  BASIC METABOLIC PANEL - Abnormal; Notable for the following components:      Result Value   Glucose, Bld 110 (*)    All other components within normal limits  CBC - Abnormal; Notable for the following components:   Platelets 149 (*)    All other components within normal limits  D-DIMER, QUANTITATIVE - Abnormal; Notable for the following components:   D-Dimer, Quant 0.83 (*)    All other components within normal limits  BRAIN NATRIURETIC PEPTIDE  HEPATIC FUNCTION PANEL  LIPASE, BLOOD  TROPONIN I (HIGH SENSITIVITY)  TROPONIN I (HIGH SENSITIVITY)     I ordered and reviewed the above labs they are notable for dimer elevated 0.8  EKG  ED ECG REPORT I, Lucillie Garfinkel, the attending physician, personally viewed and interpreted this ECG.   Date: 08/16/2022  EKG Time: 1331  Rate: 73  Rhythm: nsr  Axis:  nl  Intervals:none  ST&T Change: no acute ischemic changes    RADIOLOGY I independently reviewed and interpreted chest x-ray and see no obvious focalities or pneumothorax   PROCEDURES:  Critical Care performed: No  Procedures   MEDICATIONS ORDERED IN ED: Medications  aspirin chewable tablet 324 mg (324 mg Oral Given 08/16/22 1407)    IMPRESSION / MDM / ASSESSMENT AND PLAN / ED COURSE  I reviewed the triage vital signs and the nursing notes.                                Patient's presentation is most consistent with acute presentation with potential threat to life or bodily function.  Differential diagnosis includes, but is not limited to, ACS, pneumothorax, respiratory infection, PE, dissection, musculoskeletal pain/costochondritis, biliary pathology, pancreatitis, intra-abdominal pathologies infections   The patient is on the cardiac  monitor to evaluate for evidence of arrhythmia and/or significant heart rate changes.  MDM: Patient stable, atypical chest pain for ACS or PE, given age and risk factors will assess ACS with EKG and serial troponins.  Initial troponin unremarkable serial troponin pending upon signout.  His D-dimer was elevated and ordered a CT angiogram.  I anticipate that if the above workup is unremarkable he can be discharged with follow-up       FINAL CLINICAL IMPRESSION(S) / ED DIAGNOSES   Final diagnoses:  Atypical chest pain     Rx / DC Orders   ED Discharge Orders     None        Note:  This document was prepared using Dragon voice recognition software and may include unintentional dictation errors.    Lucillie Garfinkel, MD 08/16/22 [phone]

## 2022-09-02 DIAGNOSIS — M9903 Segmental and somatic dysfunction of lumbar region: Secondary | ICD-10-CM | POA: Diagnosis not present

## 2022-09-02 DIAGNOSIS — M9905 Segmental and somatic dysfunction of pelvic region: Secondary | ICD-10-CM | POA: Diagnosis not present

## 2022-09-02 DIAGNOSIS — M545 Low back pain, unspecified: Secondary | ICD-10-CM | POA: Diagnosis not present

## 2022-09-02 DIAGNOSIS — M5136 Other intervertebral disc degeneration, lumbar region: Secondary | ICD-10-CM | POA: Diagnosis not present

## 2022-10-19 ENCOUNTER — Other Ambulatory Visit: Payer: Self-pay | Admitting: Emergency Medicine

## 2022-10-19 DIAGNOSIS — L298 Other pruritus: Secondary | ICD-10-CM

## 2022-10-25 ENCOUNTER — Encounter: Payer: Self-pay | Admitting: Emergency Medicine

## 2022-10-25 ENCOUNTER — Other Ambulatory Visit: Payer: Self-pay | Admitting: Emergency Medicine

## 2022-10-25 ENCOUNTER — Ambulatory Visit (INDEPENDENT_AMBULATORY_CARE_PROVIDER_SITE_OTHER): Payer: Medicare HMO | Admitting: Emergency Medicine

## 2022-10-25 VITALS — BP 148/90 | HR 70 | Temp 98.1°F | Ht 68.0 in | Wt 161.4 lb

## 2022-10-25 DIAGNOSIS — E039 Hypothyroidism, unspecified: Secondary | ICD-10-CM

## 2022-10-25 DIAGNOSIS — E65 Localized adiposity: Secondary | ICD-10-CM | POA: Diagnosis not present

## 2022-10-25 DIAGNOSIS — I10 Essential (primary) hypertension: Secondary | ICD-10-CM

## 2022-10-25 DIAGNOSIS — E785 Hyperlipidemia, unspecified: Secondary | ICD-10-CM | POA: Diagnosis not present

## 2022-10-25 LAB — CBC WITH DIFFERENTIAL/PLATELET
Basophils Absolute: 0 10*3/uL (ref 0.0–0.1)
Basophils Relative: 0.5 % (ref 0.0–3.0)
Eosinophils Absolute: 0.1 10*3/uL (ref 0.0–0.7)
Eosinophils Relative: 1.3 % (ref 0.0–5.0)
HCT: 45.3 % (ref 39.0–52.0)
Hemoglobin: 15.4 g/dL (ref 13.0–17.0)
Lymphocytes Relative: 16.8 % (ref 12.0–46.0)
Lymphs Abs: 1.4 10*3/uL (ref 0.7–4.0)
MCHC: 34.1 g/dL (ref 30.0–36.0)
MCV: 90.3 fl (ref 78.0–100.0)
Monocytes Absolute: 0.4 10*3/uL (ref 0.1–1.0)
Monocytes Relative: 4.5 % (ref 3.0–12.0)
Neutro Abs: 6.6 10*3/uL (ref 1.4–7.7)
Neutrophils Relative %: 76.9 % (ref 43.0–77.0)
Platelets: 160 10*3/uL (ref 150.0–400.0)
RBC: 5.02 Mil/uL (ref 4.22–5.81)
RDW: 15.7 % — ABNORMAL HIGH (ref 11.5–15.5)
WBC: 8.6 10*3/uL (ref 4.0–10.5)

## 2022-10-25 LAB — COMPREHENSIVE METABOLIC PANEL
ALT: 14 U/L (ref 0–53)
AST: 26 U/L (ref 0–37)
Albumin: 5.1 g/dL (ref 3.5–5.2)
Alkaline Phosphatase: 34 U/L — ABNORMAL LOW (ref 39–117)
BUN: 12 mg/dL (ref 6–23)
CO2: 27 mEq/L (ref 19–32)
Calcium: 10.2 mg/dL (ref 8.4–10.5)
Chloride: 98 mEq/L (ref 96–112)
Creatinine, Ser: 1.26 mg/dL (ref 0.40–1.50)
GFR: 56.26 mL/min — ABNORMAL LOW (ref >60.00)
Glucose, Bld: 107 mg/dL — ABNORMAL HIGH (ref 70–99)
Potassium: 3.2 mEq/L — ABNORMAL LOW (ref 3.5–5.1)
Sodium: 135 mEq/L (ref 135–145)
Total Bilirubin: 0.8 mg/dL (ref 0.2–1.2)
Total Protein: 8.7 g/dL — ABNORMAL HIGH (ref 6.0–8.3)

## 2022-10-25 LAB — LIPID PANEL
Cholesterol: 262 mg/dL — ABNORMAL HIGH (ref 0–200)
HDL: 78.5 mg/dL (ref >39.00)
LDL Cholesterol: 153 mg/dL — ABNORMAL HIGH (ref 0–99)
NonHDL: 183.59
Total CHOL/HDL Ratio: 3
Triglycerides: 154 mg/dL — ABNORMAL HIGH (ref 0.0–149.0)
VLDL: 30.8 mg/dL (ref 0.0–40.0)

## 2022-10-25 LAB — HEMOGLOBIN A1C: Hgb A1c MFr Bld: 6.1 % (ref 4.6–6.5)

## 2022-10-25 LAB — TSH: TSH: 128.93 u[IU]/mL — ABNORMAL HIGH (ref 0.35–5.50)

## 2022-10-25 NOTE — Assessment & Plan Note (Signed)
Elevated blood pressure reading in the office.  Normal readings at home.  Did not take medication today Continue amlodipine 5 mg daily and Diovan HCT 160-12.5 mg daily Cardiovascular risks associated with uncontrolled hypertension discussed Dietary approaches to stop hypertension discussed

## 2022-10-25 NOTE — Patient Instructions (Signed)
Health Maintenance After Age 75 After age 75, you are at a higher risk for certain long-term diseases and infections as well as injuries from falls. Falls are a major cause of broken bones and head injuries in people who are older than age 75. Getting regular preventive care can help to keep you healthy and well. Preventive care includes getting regular testing and making lifestyle changes as recommended by your health care provider. Talk with your health care provider about: Which screenings and tests you should have. A screening is a test that checks for a disease when you have no symptoms. A diet and exercise plan that is right for you. What should I know about screenings and tests to prevent falls? Screening and testing are the best ways to find a health problem early. Early diagnosis and treatment give you the best chance of managing medical conditions that are common after age 75. Certain conditions and lifestyle choices may make you more likely to have a fall. Your health care provider may recommend: Regular vision checks. Poor vision and conditions such as cataracts can make you more likely to have a fall. If you wear glasses, make sure to get your prescription updated if your vision changes. Medicine review. Work with your health care provider to regularly review all of the medicines you are taking, including over-the-counter medicines. Ask your health care provider about any side effects that may make you more likely to have a fall. Tell your health care provider if any medicines that you take make you feel dizzy or sleepy. Strength and balance checks. Your health care provider may recommend certain tests to check your strength and balance while standing, walking, or changing positions. Foot health exam. Foot pain and numbness, as well as not wearing proper footwear, can make you more likely to have a fall. Screenings, including: Osteoporosis screening. Osteoporosis is a condition that causes  the bones to get weaker and break more easily. Blood pressure screening. Blood pressure changes and medicines to control blood pressure can make you feel dizzy. Depression screening. You may be more likely to have a fall if you have a fear of falling, feel depressed, or feel unable to do activities that you used to do. Alcohol use screening. Using too much alcohol can affect your balance and may make you more likely to have a fall. Follow these instructions at home: Lifestyle Do not drink alcohol if: Your health care provider tells you not to drink. If you drink alcohol: Limit how much you have to: 0-1 drink a day for women. 0-2 drinks a day for men. Know how much alcohol is in your drink. In the [place] one drink equals one 12 oz bottle of beer (355 mL), one 5 oz glass of wine (148 mL), or one 1 oz glass of hard liquor (44 mL). Do not use any products that contain nicotine or tobacco. These products include cigarettes, chewing tobacco, and vaping devices, such as e-cigarettes. If you need help quitting, ask your health care provider. Activity  Follow a regular exercise program to stay fit. This will help you maintain your balance. Ask your health care provider what types of exercise are appropriate for you. If you need a cane or walker, use it as recommended by your health care provider. Wear supportive shoes that have nonskid soles. Safety  Remove any tripping hazards, such as rugs, cords, and clutter. Install safety equipment such as grab bars in bathrooms and safety rails on stairs. Keep rooms and walkways   well-lit. General instructions Talk with your health care provider about your risks for falling. Tell your health care provider if: You fall. Be sure to tell your health care provider about all falls, even ones that seem minor. You feel dizzy, tiredness (fatigue), or off-balance. Take over-the-counter and prescription medicines only as told by your health care provider. These include  supplements. Eat a healthy diet and maintain a healthy weight. A healthy diet includes low-fat dairy products, low-fat (lean) meats, and fiber from whole grains, beans, and lots of fruits and vegetables. Stay current with your vaccines. Schedule regular health, dental, and eye exams. Summary Having a healthy lifestyle and getting preventive care can help to protect your health and wellness after age 75. Screening and testing are the best way to find a health problem early and help you avoid having a fall. Early diagnosis and treatment give you the best chance for managing medical conditions that are more common for people who are older than age 75. Falls are a major cause of broken bones and head injuries in people who are older than age 75. Take precautions to prevent a fall at home. Work with your health care provider to learn what changes you can make to improve your health and wellness and to prevent falls. This information is not intended to replace advice given to you by your health care provider. Make sure you discuss any questions you have with your health care provider. Document Revised: 11/10/2020 Document Reviewed: 11/10/2020 Elsevier Patient Education  2023 Elsevier Inc.  

## 2022-10-25 NOTE — Assessment & Plan Note (Signed)
Chronic stable problem Diet and nutrition discussed Continue rosuvastatin 20 mg daily

## 2022-10-25 NOTE — Assessment & Plan Note (Signed)
Clinically euthyroid.  Continue Synthroid 75 mcg daily. 

## 2022-10-25 NOTE — Assessment & Plan Note (Signed)
Around waist and chest areas No concerns

## 2022-10-25 NOTE — Progress Notes (Signed)
Christopher Arroyo 77 y.o.   Chief Complaint  Patient presents with  . Cyst    Knot above left waist, painful to touch , patient noticed it 2 weeks ago, patient states he feels a knot/lump on his buttock when he sits down     HISTORY OF PRESENT ILLNESS: This is a 77 y.o. male complaining of a lump to the left waist and sometimes right buttock for the past 2 weeks without any associated symptoms Denies injury. No other complaints or medical concerns today.    HPI   Prior to Admission medications   Medication Sig Start Date End Date Taking? Authorizing Provider  amLODipine (NORVASC) 5 MG tablet TAKE 1 TABLET (5 MG TOTAL) BY MOUTH DAILY. 02/17/22  Yes SagardiaEilleen Kempf, MD  aspirin EC 81 MG tablet Take 81 mg by mouth daily. Swallow whole.   Yes [provider]  [facility] RELIEF 2 % GEL APPLY 1 APPLICATION. TOPICALLY IN THE MORNING AND AT BEDTIME. 10/19/22  Yes Jagger Demonte, Eilleen Kempf, MD  dorzolamide-timolol (COSOPT) 2-0.5 % ophthalmic solution INSTILL 1 DROP INTO BOTH EYES TWICE A DAY 07/20/22  Yes Tierney Behl, Eilleen Kempf, MD  levothyroxine (SYNTHROID) 75 MCG tablet Take 1 tablet (75 mcg total) by mouth daily before breakfast. 02/06/21  Yes Etta Grandchild, MD  Multiple Vitamin (MULTIVITAMIN WITH MINERALS) TABS tablet Take 1 tablet by mouth daily.   Yes [provider]  valsartan-hydrochlorothiazide (DIOVAN-HCT) 160-12.5 MG tablet Take 1 tablet by mouth daily. 08/19/21  Yes Khamya Topp, Eilleen Kempf, MD  methocarbamol (ROBAXIN) 500 MG tablet Take 500 mg by mouth 2 (two) times daily as needed. Patient not taking: Reported on 10/01/2021 04/26/21   [provider]  rosuvastatin (CRESTOR) 20 MG tablet Take 1 tablet (20 mg total) by mouth daily. Patient not taking: Reported on 10/25/2022 02/05/21   Etta Grandchild, MD    No Known Allergies  Patient Active Problem List   Diagnosis Date Noted  . Cluster headaches 10/15/2021  . Allergic rhinitis 10/15/2021  . Chronic bilateral low  back pain with bilateral sciatica 05/13/2021  . LVH (left ventricular hypertrophy) due to hypertensive disease, without heart failure 05/13/2021  . Type II diabetes mellitus with manifestations 02/05/2021  . Acquired hypothyroidism 02/05/2021  . Dyslipidemia 02/05/2021  . Hepatitis C 11/24/2020  . Hepatitis B core antibody positive 11/24/2020  . 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 TIA (transient ischemic attack) 05/27/2020  . Caregiver stress syndrome 08/17/2016  . Anxiety 10/11/2011  . Hypothyroidism (acquired) 10/11/2011  . Insomnia 10/11/2011  . Primary hypertension 10/11/2011    Past Medical History:  Diagnosis Date  . Anxiety and depression   . Current use of long term anticoagulation    DAPT (ASA + clopidogrel)  . Diabetes mellitus without complication (HCC)   . History of hiatal hernia   . HLD (hyperlipidemia)   . Hypertension   . Hypothyroidism   . PSA elevation   . 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 T2DM (type 2 diabetes mellitus) (HCC) 09/02/2020  . TIA (transient ischemic attack) 2009   left hand weakness, decrease fine motor skills    Past Surgical History:  Procedure Laterality Date  . CAROTID ANGIOGRAM Right 02/20/2021  . CEREBRAL ANGIOGRAM N/A 04/24/2020   Location: [facility]  . SP EMBOLIZATION INTRACRANIAL Right 08/08/2020   4.75 x 14 mm pipeline embolization device (PED) placement to RIGHT ICA cavernous segment aneurysm; Location: [facility]; Surgeon: Johnsie Cancel, MD  .  TONSILLECTOMY      Social History   Socioeconomic History  . Marital status: Married    Spouse name: Not on file  . Number of children: Not on file  . Years of education: Not on file  . Highest education level: Not on file  Occupational History  . Not on file  Tobacco Use  . Smoking status: Former    Types: Cigarettes    Quit date: 1984    Years since quitting: 40.3  . Smokeless tobacco: Never  Vaping Use  . Vaping Use:  Never used  Substance and Sexual Activity  . Alcohol use: Not Currently  . 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 Sexual activity: Not on file  Other Topics Concern  . Not on file  Social History Narrative  . Not on file   Social Determinants of Health   Financial Resource Strain: Not on file  Food Insecurity: Not on file  Transportation Needs: Not on file  Physical Activity: Not on file  Stress: Not on file  Social Connections: Not on file  Intimate Partner Violence: Not on file    No family history on file.   Review of Systems  Constitutional: Negative.  Negative for chills and fever.  HENT: Negative.  Negative for congestion and sore throat.   Respiratory: Negative.  Negative for cough and shortness of breath.   Cardiovascular: Negative.  Negative for chest pain and palpitations.  Gastrointestinal:  Negative for abdominal pain, diarrhea, nausea and vomiting.  Genitourinary: Negative.  Negative for dysuria and hematuria.  Skin: Negative.  Negative for rash.  Neurological: Negative.  Negative for dizziness and headaches.  All other systems reviewed and are negative.   Vitals:   10/25/22 1330  BP: (!) 150/96  Pulse: 70  Temp: 98.1 F (36.7 C)  SpO2: 98%    Physical Exam Vitals reviewed.  Constitutional:      Appearance: Normal appearance.  HENT:     Head: Normocephalic.     Mouth/Throat:     Mouth: Mucous membranes are moist.     Pharynx: Oropharynx is clear.  Eyes:     Extraocular Movements: Extraocular movements intact.     Pupils: Pupils are equal, round, and reactive to light.  Cardiovascular:     Rate and Rhythm: Normal rate and regular rhythm.     Pulses: Normal pulses.     Heart sounds: Normal heart sounds.  Pulmonary:     Effort: Pulmonary effort is normal.     Breath sounds: Normal breath sounds.  Abdominal:     Tenderness: There is no abdominal tenderness.  Musculoskeletal:     Cervical back: No tenderness.   Lymphadenopathy:     Cervical: No cervical adenopathy.  Skin:    General: Skin is warm and dry.     Capillary Refill: Capillary refill takes less than 2 seconds.     Comments: Central obesity noted to be patient's complaint No masses or lumps felt on buttocks  Neurological:     General: No focal deficit present.     Mental Status: He is alert and oriented to person, place, and time.  Psychiatric:        Mood and Affect: Mood normal.        Behavior: Behavior normal.     ASSESSMENT & PLAN: A total of 43 minutes was spent with the patient and counseling/coordination of care regarding preparing for this visit, review of most  recent office visit notes, review of multiple chronic medical conditions under management, review of all medications, cardiovascular risks associated with uncontrolled hypertension, education on nutrition, prognosis, documentation and need for follow-up.  Problem List Items Addressed This Visit       Cardiovascular and Mediastinum   Primary hypertension - Primary    Elevated blood pressure reading in the office.  Normal readings at home.  Did not take medication today Continue amlodipine 5 mg daily and Diovan HCT 160-12.5 mg daily Cardiovascular risks associated with uncontrolled hypertension discussed Dietary approaches to stop hypertension discussed      Relevant Orders   CBC with Differential/Platelet (Completed)   Comprehensive metabolic panel (Completed)   Hemoglobin A1c (Completed)   Lipid panel (Completed)     Endocrine   Acquired hypothyroidism    Clinically euthyroid Continue Synthroid 75 mcg daily      Relevant Orders   CBC with Differential/Platelet (Completed)   TSH (Completed)     Other   Dyslipidemia    Chronic stable problem Diet and nutrition discussed Continue rosuvastatin 20 mg daily      Relevant Orders   CBC with Differential/Platelet (Completed)   Comprehensive metabolic panel (Completed)   Hemoglobin A1c (Completed)    Lipid panel (Completed)   Central obesity    Around waist and chest areas No concerns        Patient Instructions  Health Maintenance After Age 42 After age 37, you are at a higher risk for certain long-term diseases and infections as well as injuries from falls. Falls are a major cause of broken bones and head injuries in people who are older than age 72. Getting regular preventive care can help to keep you healthy and well. Preventive care includes getting regular testing and making lifestyle changes as recommended by your health care provider. Talk with your health care provider about: Which screenings and tests you should have. A screening is a test that checks for a disease when you have no symptoms. A diet and exercise plan that is right for you. What should I know about screenings and tests to prevent falls? Screening and testing are the best ways to find a health problem early. Early diagnosis and treatment give you the best chance of managing medical conditions that are common after age 13. Certain conditions and lifestyle choices may make you more likely to have a fall. Your health care provider may recommend: Regular vision checks. Poor vision and conditions such as cataracts can make you more likely to have a fall. If you wear glasses, make sure to get your prescription updated if your vision changes. Medicine review. Work with your health care provider to regularly review all of the medicines you are taking, including over-the-counter medicines. Ask your health care provider about any side effects that may make you more likely to have a fall. Tell your health care provider if any medicines that you take make you feel dizzy or sleepy. Strength and balance checks. Your health care provider may recommend certain tests to check your strength and balance while standing, walking, or changing positions. Foot health exam. Foot pain and numbness, as well as not wearing proper footwear, can  make you more likely to have a fall. Screenings, including: Osteoporosis screening. Osteoporosis is a condition that causes the bones to get weaker and break more easily. Blood pressure screening. Blood pressure changes and medicines to control blood pressure can make you feel dizzy. Depression screening. You may be more likely to have  a fall if you have a fear of falling, feel depressed, or feel unable to do activities that you used to do. Alcohol use screening. Using too much alcohol can affect your balance and may make you more likely to have a fall. Follow these instructions at home: Lifestyle Do not drink alcohol if: Your health care provider tells you not to drink. If you drink alcohol: Limit how much you have to: 0-1 drink a day for women. 0-2 drinks a day for men. Know how much alcohol is in your drink. In the [place] one drink equals one 12 oz bottle of beer (355 mL), one 5 oz glass of wine (148 mL), or one 1 oz glass of hard liquor (44 mL). Do not use any products that contain nicotine or tobacco. These products include cigarettes, chewing tobacco, and vaping devices, such as e-cigarettes. If you need help quitting, ask your health care provider. Activity  Follow a regular exercise program to stay fit. This will help you maintain your balance. Ask your health care provider what types of exercise are appropriate for you. If you need a cane or walker, use it as recommended by your health care provider. Wear supportive shoes that have nonskid soles. Safety  Remove any tripping hazards, such as rugs, cords, and clutter. Install safety equipment such as grab bars in bathrooms and safety rails on stairs. Keep rooms and walkways well-lit. General instructions Talk with your health care provider about your risks for falling. Tell your health care provider if: You fall. Be sure to tell your health care provider about all falls, even ones that seem minor. You feel dizzy, tiredness  (fatigue), or off-balance. Take over-the-counter and prescription medicines only as told by your health care provider. These include supplements. Eat a healthy diet and maintain a healthy weight. A healthy diet includes low-fat dairy products, low-fat (lean) meats, and fiber from whole grains, beans, and lots of fruits and vegetables. Stay current with your vaccines. Schedule regular health, dental, and eye exams. Summary Having a healthy lifestyle and getting preventive care can help to protect your health and wellness after age 5. Screening and testing are the best way to find a health problem early and help you avoid having a fall. Early diagnosis and treatment give you the best chance for managing medical conditions that are more common for people who are older than age 19. Falls are a major cause of broken bones and head injuries in people who are older than age 36. Take precautions to prevent a fall at home. Work with your health care provider to learn what changes you can make to improve your health and wellness and to prevent falls. This information is not intended to replace advice given to you by your health care provider. Make sure you discuss any questions you have with your health care provider. Document Revised: 11/10/2020 Document Reviewed: 11/10/2020 Elsevier Patient Education  2023 Elsevier Inc.    Edwina Barth, MD [facility]

## 2022-11-22 ENCOUNTER — Telehealth: Payer: Self-pay | Admitting: Emergency Medicine

## 2022-11-22 ENCOUNTER — Other Ambulatory Visit: Payer: Self-pay | Admitting: Emergency Medicine

## 2022-11-22 DIAGNOSIS — E039 Hypothyroidism, unspecified: Secondary | ICD-10-CM

## 2022-11-22 MED ORDER — LEVOTHYROXINE SODIUM 75 MCG PO TABS: 75.0000 ug | ORAL_TABLET | Freq: Every day | ORAL | 1 refills | Status: DC

## 2022-11-22 NOTE — Telephone Encounter (Signed)
Prescription Request  11/22/2022  LOV: 10/25/2022  What is the name of the medication or equipment?  levothyroxine (SYNTHROID) 75 MCG tablet  Have you contacted your pharmacy to request a refill? No   Which pharmacy would you like this sent to?  [id number] [address] [place] [address] Phone: [phone] Fax: [phone]    Patient notified that their request is being sent to the clinical staff for review and that they should receive a response within 2 business days.   Please advise at Mobile [phone] (mobile)     Patient recently had labs done and his TSH was high. He was advised to increase the dosage if he was taking his medication. Patient said he has not been taking his Synthroid at all.

## 2022-11-22 NOTE — Telephone Encounter (Signed)
New prescription sent to pharmacy of record today.  Thanks.

## 2022-12-09 DIAGNOSIS — M545 Low back pain, unspecified: Secondary | ICD-10-CM | POA: Diagnosis not present

## 2022-12-09 DIAGNOSIS — M5136 Other intervertebral disc degeneration, lumbar region: Secondary | ICD-10-CM | POA: Diagnosis not present

## 2022-12-09 DIAGNOSIS — M9905 Segmental and somatic dysfunction of pelvic region: Secondary | ICD-10-CM | POA: Diagnosis not present

## 2022-12-09 DIAGNOSIS — M9903 Segmental and somatic dysfunction of lumbar region: Secondary | ICD-10-CM | POA: Diagnosis not present

## 2022-12-10 DIAGNOSIS — M545 Low back pain, unspecified: Secondary | ICD-10-CM | POA: Diagnosis not present

## 2022-12-10 DIAGNOSIS — M9903 Segmental and somatic dysfunction of lumbar region: Secondary | ICD-10-CM | POA: Diagnosis not present

## 2022-12-10 DIAGNOSIS — M9905 Segmental and somatic dysfunction of pelvic region: Secondary | ICD-10-CM | POA: Diagnosis not present

## 2022-12-10 DIAGNOSIS — M5136 Other intervertebral disc degeneration, lumbar region: Secondary | ICD-10-CM | POA: Diagnosis not present

## 2022-12-13 DIAGNOSIS — M9905 Segmental and somatic dysfunction of pelvic region: Secondary | ICD-10-CM | POA: Diagnosis not present

## 2022-12-13 DIAGNOSIS — M9903 Segmental and somatic dysfunction of lumbar region: Secondary | ICD-10-CM | POA: Diagnosis not present

## 2022-12-13 DIAGNOSIS — M545 Low back pain, unspecified: Secondary | ICD-10-CM | POA: Diagnosis not present

## 2022-12-13 DIAGNOSIS — M5136 Other intervertebral disc degeneration, lumbar region: Secondary | ICD-10-CM | POA: Diagnosis not present

## 2022-12-16 DIAGNOSIS — M9905 Segmental and somatic dysfunction of pelvic region: Secondary | ICD-10-CM | POA: Diagnosis not present

## 2022-12-16 DIAGNOSIS — M5136 Other intervertebral disc degeneration, lumbar region: Secondary | ICD-10-CM | POA: Diagnosis not present

## 2022-12-16 DIAGNOSIS — M9903 Segmental and somatic dysfunction of lumbar region: Secondary | ICD-10-CM | POA: Diagnosis not present

## 2022-12-16 DIAGNOSIS — M545 Low back pain, unspecified: Secondary | ICD-10-CM | POA: Diagnosis not present

## 2022-12-17 DIAGNOSIS — H524 Presbyopia: Secondary | ICD-10-CM | POA: Diagnosis not present

## 2022-12-21 DIAGNOSIS — M9903 Segmental and somatic dysfunction of lumbar region: Secondary | ICD-10-CM | POA: Diagnosis not present

## 2022-12-21 DIAGNOSIS — M5136 Other intervertebral disc degeneration, lumbar region: Secondary | ICD-10-CM | POA: Diagnosis not present

## 2022-12-21 DIAGNOSIS — M545 Low back pain, unspecified: Secondary | ICD-10-CM | POA: Diagnosis not present

## 2022-12-21 DIAGNOSIS — M9905 Segmental and somatic dysfunction of pelvic region: Secondary | ICD-10-CM | POA: Diagnosis not present

## 2022-12-24 DIAGNOSIS — M9903 Segmental and somatic dysfunction of lumbar region: Secondary | ICD-10-CM | POA: Diagnosis not present

## 2022-12-24 DIAGNOSIS — M9905 Segmental and somatic dysfunction of pelvic region: Secondary | ICD-10-CM | POA: Diagnosis not present

## 2022-12-24 DIAGNOSIS — M545 Low back pain, unspecified: Secondary | ICD-10-CM | POA: Diagnosis not present

## 2022-12-24 DIAGNOSIS — M5136 Other intervertebral disc degeneration, lumbar region: Secondary | ICD-10-CM | POA: Diagnosis not present

## 2022-12-28 DIAGNOSIS — M545 Low back pain, unspecified: Secondary | ICD-10-CM | POA: Diagnosis not present

## 2022-12-28 DIAGNOSIS — M5136 Other intervertebral disc degeneration, lumbar region: Secondary | ICD-10-CM | POA: Diagnosis not present

## 2022-12-28 DIAGNOSIS — M9905 Segmental and somatic dysfunction of pelvic region: Secondary | ICD-10-CM | POA: Diagnosis not present

## 2022-12-28 DIAGNOSIS — M9903 Segmental and somatic dysfunction of lumbar region: Secondary | ICD-10-CM | POA: Diagnosis not present

## 2023-01-04 DIAGNOSIS — M545 Low back pain, unspecified: Secondary | ICD-10-CM | POA: Diagnosis not present

## 2023-01-04 DIAGNOSIS — M9903 Segmental and somatic dysfunction of lumbar region: Secondary | ICD-10-CM | POA: Diagnosis not present

## 2023-01-04 DIAGNOSIS — M5136 Other intervertebral disc degeneration, lumbar region: Secondary | ICD-10-CM | POA: Diagnosis not present

## 2023-01-04 DIAGNOSIS — M9905 Segmental and somatic dysfunction of pelvic region: Secondary | ICD-10-CM | POA: Diagnosis not present

## 2023-01-14 ENCOUNTER — Other Ambulatory Visit: Payer: Self-pay | Admitting: Emergency Medicine

## 2023-01-14 DIAGNOSIS — I10 Essential (primary) hypertension: Secondary | ICD-10-CM

## 2023-01-14 DIAGNOSIS — I119 Hypertensive heart disease without heart failure: Secondary | ICD-10-CM

## 2023-01-19 DIAGNOSIS — M5136 Other intervertebral disc degeneration, lumbar region: Secondary | ICD-10-CM | POA: Diagnosis not present

## 2023-01-19 DIAGNOSIS — M9903 Segmental and somatic dysfunction of lumbar region: Secondary | ICD-10-CM | POA: Diagnosis not present

## 2023-01-19 DIAGNOSIS — M9905 Segmental and somatic dysfunction of pelvic region: Secondary | ICD-10-CM | POA: Diagnosis not present

## 2023-01-19 DIAGNOSIS — M545 Low back pain, unspecified: Secondary | ICD-10-CM | POA: Diagnosis not present

## 2023-02-02 DIAGNOSIS — M9903 Segmental and somatic dysfunction of lumbar region: Secondary | ICD-10-CM | POA: Diagnosis not present

## 2023-02-02 DIAGNOSIS — M9905 Segmental and somatic dysfunction of pelvic region: Secondary | ICD-10-CM | POA: Diagnosis not present

## 2023-02-02 DIAGNOSIS — M5136 Other intervertebral disc degeneration, lumbar region: Secondary | ICD-10-CM | POA: Diagnosis not present

## 2023-02-02 DIAGNOSIS — M545 Low back pain, unspecified: Secondary | ICD-10-CM | POA: Diagnosis not present

## 2023-02-12 DIAGNOSIS — M1990 Unspecified osteoarthritis, unspecified site: Secondary | ICD-10-CM | POA: Diagnosis not present

## 2023-02-12 DIAGNOSIS — E785 Hyperlipidemia, unspecified: Secondary | ICD-10-CM | POA: Diagnosis not present

## 2023-02-12 DIAGNOSIS — E039 Hypothyroidism, unspecified: Secondary | ICD-10-CM | POA: Diagnosis not present

## 2023-02-12 DIAGNOSIS — R32 Unspecified urinary incontinence: Secondary | ICD-10-CM | POA: Diagnosis not present

## 2023-02-12 DIAGNOSIS — H409 Unspecified glaucoma: Secondary | ICD-10-CM | POA: Diagnosis not present

## 2023-02-12 DIAGNOSIS — N529 Male erectile dysfunction, unspecified: Secondary | ICD-10-CM | POA: Diagnosis not present

## 2023-02-12 DIAGNOSIS — I10 Essential (primary) hypertension: Secondary | ICD-10-CM | POA: Diagnosis not present

## 2023-02-12 DIAGNOSIS — J439 Emphysema, unspecified: Secondary | ICD-10-CM | POA: Diagnosis not present

## 2023-02-12 DIAGNOSIS — F325 Major depressive disorder, single episode, in full remission: Secondary | ICD-10-CM | POA: Diagnosis not present

## 2023-02-12 DIAGNOSIS — M545 Low back pain, unspecified: Secondary | ICD-10-CM | POA: Diagnosis not present

## 2023-02-12 DIAGNOSIS — I2510 Atherosclerotic heart disease of native coronary artery without angina pectoris: Secondary | ICD-10-CM | POA: Diagnosis not present

## 2023-02-12 DIAGNOSIS — E1142 Type 2 diabetes mellitus with diabetic polyneuropathy: Secondary | ICD-10-CM | POA: Diagnosis not present

## 2023-02-28 ENCOUNTER — Other Ambulatory Visit: Payer: Self-pay | Admitting: Emergency Medicine

## 2023-02-28 DIAGNOSIS — E039 Hypothyroidism, unspecified: Secondary | ICD-10-CM

## 2023-03-02 DIAGNOSIS — H40003 Preglaucoma, unspecified, bilateral: Secondary | ICD-10-CM | POA: Diagnosis not present

## 2023-03-02 DIAGNOSIS — H2513 Age-related nuclear cataract, bilateral: Secondary | ICD-10-CM | POA: Diagnosis not present

## 2023-03-08 ENCOUNTER — Encounter: Payer: Self-pay | Admitting: Pharmacist

## 2023-03-14 ENCOUNTER — Ambulatory Visit: Payer: Medicare HMO | Admitting: Emergency Medicine

## 2023-03-17 ENCOUNTER — Ambulatory Visit (INDEPENDENT_AMBULATORY_CARE_PROVIDER_SITE_OTHER): Payer: Medicare HMO | Admitting: Emergency Medicine

## 2023-03-17 ENCOUNTER — Encounter: Payer: Self-pay | Admitting: Emergency Medicine

## 2023-03-17 VITALS — BP 140/88 | HR 83 | Temp 98.3°F | Ht 68.0 in | Wt 157.2 lb

## 2023-03-17 DIAGNOSIS — B351 Tinea unguium: Secondary | ICD-10-CM

## 2023-03-17 DIAGNOSIS — R7303 Prediabetes: Secondary | ICD-10-CM

## 2023-03-17 DIAGNOSIS — E785 Hyperlipidemia, unspecified: Secondary | ICD-10-CM

## 2023-03-17 DIAGNOSIS — I10 Essential (primary) hypertension: Secondary | ICD-10-CM | POA: Diagnosis not present

## 2023-03-17 DIAGNOSIS — E039 Hypothyroidism, unspecified: Secondary | ICD-10-CM | POA: Diagnosis not present

## 2023-03-17 MED ORDER — ROSUVASTATIN CALCIUM 20 MG PO TABS: 20.0000 mg | ORAL_TABLET | Freq: Every day | ORAL | 1 refills | Status: DC

## 2023-03-17 NOTE — Assessment & Plan Note (Signed)
Diet and nutrition discussed. Cardiovascular risk associated with diabetes discussed.

## 2023-03-17 NOTE — Assessment & Plan Note (Signed)
Elevated blood pressure in the office but normal blood pressure readings at home Continue amlodipine 5 mg daily, and valsartan HCT 160-12.5 mg daily Cardiovascular risk associated with hypertension discussed Diet and nutrition discussed Follow-up in 6 months

## 2023-03-17 NOTE — Assessment & Plan Note (Signed)
Advanced.  Recommend podiatry evaluation. Referral placed today.

## 2023-03-17 NOTE — Assessment & Plan Note (Signed)
Clinically euthyroid.  Continue levothyroxine 75 mcg daily. 

## 2023-03-17 NOTE — Assessment & Plan Note (Signed)
Chronic stable condition Diet and nutrition discussed Not taking rosuvastatin at present time The 10-year ASCVD risk score (Arnett DK, et al., 2019) is: 40.3%   Values used to calculate the score:     Age: 77 years     Sex: Male     Is Non-Hispanic African American: Yes     Diabetic: Yes     Tobacco smoker: No     Systolic Blood Pressure: 140 mmHg     Is BP treated: Yes     HDL Cholesterol: 78.5 mg/dL     Total Cholesterol: 262 mg/dL

## 2023-03-17 NOTE — Patient Instructions (Signed)
Health Maintenance After Age 75 After age 75, you are at a higher risk for certain long-term diseases and infections as well as injuries from falls. Falls are a major cause of broken bones and head injuries in people who are older than age 75. Getting regular preventive care can help to keep you healthy and well. Preventive care includes getting regular testing and making lifestyle changes as recommended by your health care provider. Talk with your health care provider about: Which screenings and tests you should have. A screening is a test that checks for a disease when you have no symptoms. A diet and exercise plan that is right for you. What should I know about screenings and tests to prevent falls? Screening and testing are the best ways to find a health problem early. Early diagnosis and treatment give you the best chance of managing medical conditions that are common after age 75. Certain conditions and lifestyle choices may make you more likely to have a fall. Your health care provider may recommend: Regular vision checks. Poor vision and conditions such as cataracts can make you more likely to have a fall. If you wear glasses, make sure to get your prescription updated if your vision changes. Medicine review. Work with your health care provider to regularly review all of the medicines you are taking, including over-the-counter medicines. Ask your health care provider about any side effects that may make you more likely to have a fall. Tell your health care provider if any medicines that you take make you feel dizzy or sleepy. Strength and balance checks. Your health care provider may recommend certain tests to check your strength and balance while standing, walking, or changing positions. Foot health exam. Foot pain and numbness, as well as not wearing proper footwear, can make you more likely to have a fall. Screenings, including: Osteoporosis screening. Osteoporosis is a condition that causes  the bones to get weaker and break more easily. Blood pressure screening. Blood pressure changes and medicines to control blood pressure can make you feel dizzy. Depression screening. You may be more likely to have a fall if you have a fear of falling, feel depressed, or feel unable to do activities that you used to do. Alcohol use screening. Using too much alcohol can affect your balance and may make you more likely to have a fall. Follow these instructions at home: Lifestyle Do not drink alcohol if: Your health care provider tells you not to drink. If you drink alcohol: Limit how much you have to: 0-1 drink a day for women. 0-2 drinks a day for men. Know how much alcohol is in your drink. In the [place] one drink equals one 12 oz bottle of beer (355 mL), one 5 oz glass of wine (148 mL), or one 1 oz glass of hard liquor (44 mL). Do not use any products that contain nicotine or tobacco. These products include cigarettes, chewing tobacco, and vaping devices, such as e-cigarettes. If you need help quitting, ask your health care provider. Activity  Follow a regular exercise program to stay fit. This will help you maintain your balance. Ask your health care provider what types of exercise are appropriate for you. If you need a cane or walker, use it as recommended by your health care provider. Wear supportive shoes that have nonskid soles. Safety  Remove any tripping hazards, such as rugs, cords, and clutter. Install safety equipment such as grab bars in bathrooms and safety rails on stairs. Keep rooms and walkways   well-lit. General instructions Talk with your health care provider about your risks for falling. Tell your health care provider if: You fall. Be sure to tell your health care provider about all falls, even ones that seem minor. You feel dizzy, tiredness (fatigue), or off-balance. Take over-the-counter and prescription medicines only as told by your health care provider. These include  supplements. Eat a healthy diet and maintain a healthy weight. A healthy diet includes low-fat dairy products, low-fat (lean) meats, and fiber from whole grains, beans, and lots of fruits and vegetables. Stay current with your vaccines. Schedule regular health, dental, and eye exams. Summary Having a healthy lifestyle and getting preventive care can help to protect your health and wellness after age 75. Screening and testing are the best way to find a health problem early and help you avoid having a fall. Early diagnosis and treatment give you the best chance for managing medical conditions that are more common for people who are older than age 75. Falls are a major cause of broken bones and head injuries in people who are older than age 75. Take precautions to prevent a fall at home. Work with your health care provider to learn what changes you can make to improve your health and wellness and to prevent falls. This information is not intended to replace advice given to you by your health care provider. Make sure you discuss any questions you have with your health care provider. Document Revised: 11/10/2020 Document Reviewed: 11/10/2020 Elsevier Patient Education  2024 Elsevier Inc.  

## 2023-03-17 NOTE — Progress Notes (Signed)
Christopher Arroyo 77 y.o.   Chief Complaint  Patient presents with   Medical Management of Chronic Issues    Discoloration on his big toe ( right foot ) patient states it hurts at times, unsure if it is a fungus     HISTORY OF PRESENT ILLNESS: This is a 77 y.o. male complaining of discoloration of big toenail right foot for months. Also here for follow-up of chronic medical conditions including hypertension, dyslipidemia, and hypothyroidism.  HPI   Prior to Admission medications   Medication Sig Start Date End Date Taking? Authorizing Provider  amLODipine (NORVASC) 5 MG tablet TAKE 1 TABLET (5 MG TOTAL) BY MOUTH DAILY. 01/14/23  Yes SagardiaEilleen Kempf, MD  aspirin EC 81 MG tablet Take 81 mg by mouth daily. Swallow whole.   Yes [provider]  levothyroxine (SYNTHROID) 75 MCG tablet TAKE 1 TABLET BY MOUTH DAILY BEFORE BREAKFAST. 02/28/23  Yes Yaslin Kirtley, Eilleen Kempf, MD  Multiple Vitamin (MULTIVITAMIN WITH MINERALS) TABS tablet Take 1 tablet by mouth daily.   Yes [provider]  valsartan-hydrochlorothiazide (DIOVAN-HCT) 160-12.5 MG tablet TAKE 1 TABLET BY MOUTH EVERY DAY 01/15/23  Yes Rache Klimaszewski, Eilleen Kempf, MD  [facility] RELIEF 2 % GEL APPLY 1 APPLICATION. TOPICALLY IN THE MORNING AND AT BEDTIME. 10/19/22   Georgina Quint, MD  dorzolamide-timolol (COSOPT) 2-0.5 % ophthalmic solution INSTILL 1 DROP INTO BOTH EYES TWICE A DAY 07/20/22   Georgina Quint, MD  methocarbamol (ROBAXIN) 500 MG tablet Take 500 mg by mouth 2 (two) times daily as needed. Patient not taking: Reported on 10/01/2021 04/26/21   [provider]  rosuvastatin (CRESTOR) 20 MG tablet Take 1 tablet (20 mg total) by mouth daily. 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   Cluster headaches 10/15/2021   Allergic rhinitis 10/15/2021   Chronic bilateral low  back pain with bilateral sciatica 05/13/2021   LVH (left ventricular hypertrophy) due to hypertensive disease, without heart failure 05/13/2021   Type II diabetes mellitus with manifestations (HCC) 02/05/2021   Acquired hypothyroidism 02/05/2021   Dyslipidemia 02/05/2021   Hepatitis C 11/24/2020   Hepatitis B core antibody positive 11/24/2020   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   TIA (transient ischemic attack) 05/27/2020   Caregiver stress syndrome 08/17/2016   Anxiety 10/11/2011   Hypothyroidism (acquired) 10/11/2011   Insomnia 10/11/2011   Primary hypertension 10/11/2011    Past Medical History:  Diagnosis Date   Anxiety and depression    Current use of long term anticoagulation    DAPT (ASA + clopidogrel)   Diabetes mellitus without complication (HCC)    History of hiatal hernia    HLD (hyperlipidemia)    Hypertension    Hypothyroidism    PSA elevation    Right internal carotid artery aneurysm    s/p pipeline embolization on 08/08/2020   T2DM (type 2 diabetes mellitus) (HCC) 09/02/2020   TIA (transient ischemic attack) 2009   left hand weakness, decrease fine motor skills    Past Surgical History:  Procedure Laterality Date   CAROTID ANGIOGRAM Right 02/20/2021   CEREBRAL ANGIOGRAM N/A 04/24/2020   Location: [facility]   SP EMBOLIZATION INTRACRANIAL Right 08/08/2020   4.75 x 14 mm pipeline embolization device (PED) placement to RIGHT ICA cavernous segment aneurysm; Location: [facility]; Surgeon: Johnsie Cancel, MD   TONSILLECTOMY  Social History   Socioeconomic History   Marital status: Married    Spouse name: Not on file   Number of children: Not on file   Years of education: Not on file   Highest education level: Not on file  Occupational History   Not on file  Tobacco Use   Smoking status: Former    Current packs/day: 0.00    Types: Cigarettes    Quit date: 70    Years since quitting: 40.7   Smokeless tobacco: Never  Vaping Use   Vaping status:  Never Used  Substance and Sexual Activity   Alcohol use: Not Currently   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   Review of Systems  Constitutional: Negative.  Negative for chills and fever.  HENT: Negative.  Negative for congestion and sore throat.   Respiratory: Negative.  Negative for cough and shortness of breath.   Cardiovascular: Negative.  Negative for chest pain and palpitations.  Gastrointestinal:  Negative for abdominal pain, diarrhea, nausea and vomiting.  Genitourinary: Negative.   Skin: Negative.  Negative for rash.  Neurological: Negative.  Negative for dizziness and headaches.  All other systems reviewed and are negative.   Today's Vitals   03/17/23 1348  BP: (!) 140/88  Pulse: 83  Temp: 98.3 F (36.8 C)  TempSrc: Oral  SpO2: 97%  Weight: 157 lb 4 oz (71.3 kg)  Height: 5\' 8"  (1.727 m)   Body mass index is 23.91 kg/m.   Physical Exam Vitals reviewed.  Constitutional:      Appearance: Normal appearance.  HENT:     Head: Normocephalic.  Eyes:     Extraocular Movements: Extraocular movements intact.  Cardiovascular:     Rate and Rhythm: Normal rate and regular rhythm.     Pulses: Normal pulses.     Heart sounds: Normal heart sounds.  Pulmonary:     Effort: Pulmonary effort is normal.     Breath sounds: Normal breath sounds.  Musculoskeletal:     Cervical back: No tenderness.     Comments: Right big toe: Onychomycosis of toenail  Lymphadenopathy:     Cervical: No cervical adenopathy.  Skin:    General: Skin is warm  and dry.  Neurological:     Mental Status: He is alert and oriented to person, place, and time.  Psychiatric:        Mood and Affect: Mood normal.        Behavior: Behavior normal.      ASSESSMENT & PLAN: A total of 46 minutes was spent with the patient and counseling/coordination of care regarding preparing for this visit, review of most recent office visit notes, review of multiple chronic medical conditions under management, review of most recent blood work results, review of all medications, review of health maintenance items, cardiovascular risks associated with hypertension, education on nutrition, prognosis, documentation, and need for follow-up.  Problem List Items Addressed This Visit       Cardiovascular  and Mediastinum   Primary hypertension - Primary    Elevated blood pressure in the office but normal blood pressure readings at home Continue amlodipine 5 mg daily, and valsartan HCT 160-12.5 mg daily Cardiovascular risk associated with hypertension discussed Diet and nutrition discussed Follow-up in 6 months      Relevant Medications   rosuvastatin (CRESTOR) 20 MG tablet     Endocrine   Hypothyroidism (acquired)   Acquired hypothyroidism    Clinically euthyroid. Continue levothyroxine 75 mcg daily        Musculoskeletal and Integument   Onychomycosis of great toe    Advanced.  Recommend podiatry evaluation. Referral placed today.      Relevant Orders   Ambulatory referral to Podiatry     Other   Prediabetes    Diet and nutrition discussed Cardiovascular risk associated with diabetes discussed      Dyslipidemia    Chronic stable condition Diet and nutrition discussed Not taking rosuvastatin at present time The 10-year ASCVD risk score (Arnett DK, et al., 2019) is: 40.3%   Values used to calculate the score:     Age: 36 years     Sex: Male     Is Non-Hispanic African American: Yes     Diabetic: Yes     Tobacco smoker: No     Systolic Blood  Pressure: 140 mmHg     Is BP treated: Yes     HDL Cholesterol: 78.5 mg/dL     Total Cholesterol: 262 mg/dL       Relevant Medications   rosuvastatin (CRESTOR) 20 MG tablet   Other Visit Diagnoses     Hyperlipidemia with target LDL less than 70       Relevant Medications   rosuvastatin (CRESTOR) 20 MG tablet      Patient Instructions  Health Maintenance After Age 38 After age 71, you are at a higher risk for certain long-term diseases and infections as well as injuries from falls. Falls are a major cause of broken bones and head injuries in people who are older than age 96. Getting regular preventive care can help to keep you healthy and well. Preventive care includes getting regular testing and making lifestyle changes as recommended by your health care provider. Talk with your health care provider about: Which screenings and tests you should have. A screening is a test that checks for a disease when you have no symptoms. A diet and exercise plan that is right for you. What should I know about screenings and tests to prevent falls? Screening and testing are the best ways to find a health problem early. Early diagnosis and treatment give you the best chance of managing medical conditions that are common after age 1. Certain conditions and lifestyle choices may make you more likely to have a fall. Your health care provider may recommend: Regular vision checks. Poor vision and conditions such as cataracts can make you more likely to have a fall. If you wear glasses, make sure to get your prescription updated if your vision changes. Medicine review. Work with your health care provider to regularly review all of the medicines you are taking, including over-the-counter medicines. Ask your health care provider about any side effects that may make you more likely to have a fall. Tell your health care provider if any medicines that you take make you feel dizzy or sleepy. Strength and balance  checks. Your health care provider may recommend certain tests to check your strength and balance while standing,  walking, or changing positions. Foot health exam. Foot pain and numbness, as well as not wearing proper footwear, can make you more likely to have a fall. Screenings, including: Osteoporosis screening. Osteoporosis is a condition that causes the bones to get weaker and break more easily. Blood pressure screening. Blood pressure changes and medicines to control blood pressure can make you feel dizzy. Depression screening. You may be more likely to have a fall if you have a fear of falling, feel depressed, or feel unable to do activities that you used to do. Alcohol use screening. Using too much alcohol can affect your balance and may make you more likely to have a fall. Follow these instructions at home: Lifestyle Do not drink alcohol if: Your health care provider tells you not to drink. If you drink alcohol: Limit how much you have to: 0-1 drink a day for women. 0-2 drinks a day for men. Know how much alcohol is in your drink. In the [place] one drink equals one 12 oz bottle of beer (355 mL), one 5 oz glass of wine (148 mL), or one 1 oz glass of hard liquor (44 mL). Do not use any products that contain nicotine or tobacco. These products include cigarettes, chewing tobacco, and vaping devices, such as e-cigarettes. If you need help quitting, ask your health care provider. Activity  Follow a regular exercise program to stay fit. This will help you maintain your balance. Ask your health care provider what types of exercise are appropriate for you. If you need a cane or walker, use it as recommended by your health care provider. Wear supportive shoes that have nonskid soles. Safety  Remove any tripping hazards, such as rugs, cords, and clutter. Install safety equipment such as grab bars in bathrooms and safety rails on stairs. Keep rooms and walkways well-lit. General  instructions Talk with your health care provider about your risks for falling. Tell your health care provider if: You fall. Be sure to tell your health care provider about all falls, even ones that seem minor. You feel dizzy, tiredness (fatigue), or off-balance. Take over-the-counter and prescription medicines only as told by your health care provider. These include supplements. Eat a healthy diet and maintain a healthy weight. A healthy diet includes low-fat dairy products, low-fat (lean) meats, and fiber from whole grains, beans, and lots of fruits and vegetables. Stay current with your vaccines. Schedule regular health, dental, and eye exams. Summary Having a healthy lifestyle and getting preventive care can help to protect your health and wellness after age 98. Screening and testing are the best way to find a health problem early and help you avoid having a fall. Early diagnosis and treatment give you the best chance for managing medical conditions that are more common for people who are older than age 15. Falls are a major cause of broken bones and head injuries in people who are older than age 38. Take precautions to prevent a fall at home. Work with your health care provider to learn what changes you can make to improve your health and wellness and to prevent falls. This information is not intended to replace advice given to you by your health care provider. Make sure you discuss any questions you have with your health care provider. Document Revised: 11/10/2020 Document Reviewed: 11/10/2020 Elsevier Patient Education  2024 Elsevier Inc.    Edwina Barth, MD [facility]

## 2023-03-24 ENCOUNTER — Ambulatory Visit: Payer: Medicare HMO | Admitting: Podiatry

## 2023-03-24 DIAGNOSIS — B351 Tinea unguium: Secondary | ICD-10-CM | POA: Diagnosis not present

## 2023-03-24 DIAGNOSIS — Z79899 Other long term (current) drug therapy: Secondary | ICD-10-CM

## 2023-03-24 NOTE — Progress Notes (Signed)
Subjective:  Patient ID: Christopher Arroyo, male    DOB: December 10, 1945,  MRN: [id number]  Chief Complaint  Patient presents with   Nail Problem    Right hallux nail discoloration     77 y.o. male presents with the above complaint.  Patient presents with right hallux nail discoloration.  Patient states that it is thickened onychodystrophy mycotic nail he would like to discuss treatment options for it.  He would he has tried topical medication which has not helped.  He would like to discuss oral medication.  Denies any other acute complaints.   Review of Systems: Negative except as noted in the HPI. Denies N/V/F/Ch.  Past Medical History:  Diagnosis Date   Anxiety and depression    Current use of long term anticoagulation    DAPT (ASA + clopidogrel)   Diabetes mellitus without complication (HCC)    History of hiatal hernia    HLD (hyperlipidemia)    Hypertension    Hypothyroidism    PSA elevation    Right internal carotid artery aneurysm    s/p pipeline embolization on 08/08/2020   T2DM (type 2 diabetes mellitus) (HCC) 09/02/2020   TIA (transient ischemic attack) 2009   left hand weakness, decrease fine motor skills    Current Outpatient Medications:    amLODipine (NORVASC) 5 MG tablet, TAKE 1 TABLET (5 MG TOTAL) BY MOUTH DAILY., Disp: 90 tablet, Rfl: 3   [facility] RELIEF 2 % GEL, APPLY 1 APPLICATION. TOPICALLY IN THE MORNING AND AT BEDTIME., Disp: 118 g, Rfl: 2   dorzolamide-timolol (COSOPT) 2-0.5 % ophthalmic solution, INSTILL 1 DROP INTO BOTH EYES TWICE A DAY, Disp: 10 mL, Rfl: 3   levothyroxine (SYNTHROID) 75 MCG tablet, TAKE 1 TABLET BY MOUTH DAILY BEFORE BREAKFAST., Disp: 90 tablet, Rfl: 1   Multiple Vitamin (MULTIVITAMIN WITH MINERALS) TABS tablet, Take 1 tablet by mouth daily., Disp: , Rfl:    rosuvastatin (CRESTOR) 20 MG tablet, Take 1 tablet (20 mg total) by mouth daily., Disp: 90 tablet, Rfl: 1   valsartan-hydrochlorothiazide (DIOVAN-HCT) 160-12.5 MG tablet, TAKE 1 TABLET BY  MOUTH EVERY DAY, Disp: 90 tablet, Rfl: 3  Social History   Tobacco Use  Smoking Status Former   Current packs/day: 0.00   Types: Cigarettes   Quit date: 1984   Years since quitting: 40.7  Smokeless Tobacco Never    No Known Allergies Objective:  There were no vitals filed for this visit. There is no height or weight on file to calculate BMI. Constitutional Well developed. Well nourished.  Vascular Dorsalis pedis pulses palpable bilaterally. Posterior tibial pulses palpable bilaterally. Capillary refill normal to all digits.  No cyanosis or clubbing noted. Pedal hair growth normal.  Neurologic Normal speech. Oriented to person, place, and time. Epicritic sensation to light touch grossly present bilaterally.  Dermatologic Nails thickened onychodystrophy mycotic toenails x 1 right hallux Skin within normal limits  Orthopedic: Normal joint ROM without pain or crepitus bilaterally. No visible deformities. No bony tenderness.   Radiographs: None Assessment:   1. Long-term use of high-risk medication   2. Nail fungus   3. Onychomycosis due to dermatophyte    Plan:  Patient was evaluated and treated and all questions answered.  Right hallux onychomycosis -Educated the patient on the etiology of onychomycosis and various treatment options associated with improving the fungal load.  I explained to the patient that there is 3 treatment options available to treat the onychomycosis including topical, p.o., laser treatment.  Patient elected to undergo p.o. options  with Lamisil/terbinafine therapy.  In order for me to start the medication therapy, I explained to the patient the importance of evaluating the liver and obtaining the liver function test.  Once the liver function test comes back normal I will start him on 92-month course of Lamisil therapy.  Patient understood all risk and would like to proceed with Lamisil therapy.  I have asked the patient to immediately stop the Lamisil  therapy if she has any reactions to it and call the office or go to the emergency room right away.  Patient states understanding   No follow-ups on file.

## 2023-04-26 ENCOUNTER — Ambulatory Visit: Payer: Medicare HMO | Admitting: Emergency Medicine

## 2023-05-02 ENCOUNTER — Ambulatory Visit: Payer: Medicare HMO | Admitting: Emergency Medicine

## 2023-06-06 DIAGNOSIS — M9903 Segmental and somatic dysfunction of lumbar region: Secondary | ICD-10-CM | POA: Diagnosis not present

## 2023-06-06 DIAGNOSIS — M9905 Segmental and somatic dysfunction of pelvic region: Secondary | ICD-10-CM | POA: Diagnosis not present

## 2023-06-06 DIAGNOSIS — M5136 Other intervertebral disc degeneration, lumbar region with discogenic back pain only: Secondary | ICD-10-CM | POA: Diagnosis not present

## 2023-06-06 DIAGNOSIS — M545 Low back pain, unspecified: Secondary | ICD-10-CM | POA: Diagnosis not present

## 2023-06-07 ENCOUNTER — Encounter: Payer: Medicare HMO | Admitting: Family Medicine

## 2023-06-07 NOTE — Progress Notes (Unsigned)
   SUBJECTIVE:  No chief complaint on file.  HPI Presents to clinic  PERTINENT PMH / PSH: ***  OBJECTIVE:  There were no vitals taken for this visit.   Physical Exam     03/17/2023    1:50 PM 10/25/2022    1:33 PM 10/01/2021    2:48 PM 08/19/2021    2:12 PM 02/05/2021    1:06 PM  Depression screen PHQ 2/9  Decreased Interest 0 0 0 2 0  Down, Depressed, Hopeless 0 0 0  0  PHQ - 2 Score 0 0 0 2 0  Altered sleeping  0  2   Tired, decreased energy  0  1   Change in appetite  0  1   Feeling bad or failure about yourself   0  3   Trouble concentrating  0  2   Moving slowly or fidgety/restless  0  1   Suicidal thoughts  0  2   PHQ-9 Score  0  14   Difficult doing work/chores    Very difficult        No data to display          ASSESSMENT/PLAN:  There are no diagnoses linked to this encounter. PDMP reviewed***  No follow-ups on file.  Dana Allan, MD

## 2023-06-07 NOTE — Patient Instructions (Incomplete)
It was a pleasure meeting you today. Thank you for allowing me to take part in your health care.  Our goals for today as we discussed include:  Will order ultrasound to check for stones Take ibuprofen 600 mg every 8 hours for pain as needed  Will get some labs today and notify your results.  4 weeks If you have any questions or concerns, please do not hesitate to call the office at [phone].  I look forward to our next visit and until then take care and stay safe.  Regards,   Janyth Riera, MD   [place]  

## 2023-06-08 DIAGNOSIS — H40003 Preglaucoma, unspecified, bilateral: Secondary | ICD-10-CM | POA: Diagnosis not present

## 2023-06-08 DIAGNOSIS — M9903 Segmental and somatic dysfunction of lumbar region: Secondary | ICD-10-CM | POA: Diagnosis not present

## 2023-06-08 DIAGNOSIS — M545 Low back pain, unspecified: Secondary | ICD-10-CM | POA: Diagnosis not present

## 2023-06-08 DIAGNOSIS — M5136 Other intervertebral disc degeneration, lumbar region with discogenic back pain only: Secondary | ICD-10-CM | POA: Diagnosis not present

## 2023-06-08 DIAGNOSIS — M9905 Segmental and somatic dysfunction of pelvic region: Secondary | ICD-10-CM | POA: Diagnosis not present

## 2023-06-09 DIAGNOSIS — M9903 Segmental and somatic dysfunction of lumbar region: Secondary | ICD-10-CM | POA: Diagnosis not present

## 2023-06-09 DIAGNOSIS — M5136 Other intervertebral disc degeneration, lumbar region with discogenic back pain only: Secondary | ICD-10-CM | POA: Diagnosis not present

## 2023-06-09 DIAGNOSIS — M545 Low back pain, unspecified: Secondary | ICD-10-CM | POA: Diagnosis not present

## 2023-06-09 DIAGNOSIS — M9905 Segmental and somatic dysfunction of pelvic region: Secondary | ICD-10-CM | POA: Diagnosis not present

## 2023-06-13 DIAGNOSIS — M9903 Segmental and somatic dysfunction of lumbar region: Secondary | ICD-10-CM | POA: Diagnosis not present

## 2023-06-13 DIAGNOSIS — M545 Low back pain, unspecified: Secondary | ICD-10-CM | POA: Diagnosis not present

## 2023-06-13 DIAGNOSIS — M9905 Segmental and somatic dysfunction of pelvic region: Secondary | ICD-10-CM | POA: Diagnosis not present

## 2023-06-13 DIAGNOSIS — M5136 Other intervertebral disc degeneration, lumbar region with discogenic back pain only: Secondary | ICD-10-CM | POA: Diagnosis not present

## 2023-06-14 DIAGNOSIS — M545 Low back pain, unspecified: Secondary | ICD-10-CM | POA: Diagnosis not present

## 2023-06-14 DIAGNOSIS — M9903 Segmental and somatic dysfunction of lumbar region: Secondary | ICD-10-CM | POA: Diagnosis not present

## 2023-06-14 DIAGNOSIS — M5136 Other intervertebral disc degeneration, lumbar region with discogenic back pain only: Secondary | ICD-10-CM | POA: Diagnosis not present

## 2023-06-14 DIAGNOSIS — M9905 Segmental and somatic dysfunction of pelvic region: Secondary | ICD-10-CM | POA: Diagnosis not present

## 2023-06-15 DIAGNOSIS — H04123 Dry eye syndrome of bilateral lacrimal glands: Secondary | ICD-10-CM | POA: Diagnosis not present

## 2023-06-15 DIAGNOSIS — H2513 Age-related nuclear cataract, bilateral: Secondary | ICD-10-CM | POA: Diagnosis not present

## 2023-06-15 DIAGNOSIS — H401111 Primary open-angle glaucoma, right eye, mild stage: Secondary | ICD-10-CM | POA: Diagnosis not present

## 2023-06-15 DIAGNOSIS — H401122 Primary open-angle glaucoma, left eye, moderate stage: Secondary | ICD-10-CM | POA: Diagnosis not present

## 2023-06-16 DIAGNOSIS — M9903 Segmental and somatic dysfunction of lumbar region: Secondary | ICD-10-CM | POA: Diagnosis not present

## 2023-06-16 DIAGNOSIS — M545 Low back pain, unspecified: Secondary | ICD-10-CM | POA: Diagnosis not present

## 2023-06-16 DIAGNOSIS — M9905 Segmental and somatic dysfunction of pelvic region: Secondary | ICD-10-CM | POA: Diagnosis not present

## 2023-06-16 DIAGNOSIS — M5136 Other intervertebral disc degeneration, lumbar region with discogenic back pain only: Secondary | ICD-10-CM | POA: Diagnosis not present

## 2023-06-20 DIAGNOSIS — M9903 Segmental and somatic dysfunction of lumbar region: Secondary | ICD-10-CM | POA: Diagnosis not present

## 2023-06-20 DIAGNOSIS — M9905 Segmental and somatic dysfunction of pelvic region: Secondary | ICD-10-CM | POA: Diagnosis not present

## 2023-06-20 DIAGNOSIS — M545 Low back pain, unspecified: Secondary | ICD-10-CM | POA: Diagnosis not present

## 2023-06-20 DIAGNOSIS — M5136 Other intervertebral disc degeneration, lumbar region with discogenic back pain only: Secondary | ICD-10-CM | POA: Diagnosis not present

## 2023-06-23 DIAGNOSIS — M9903 Segmental and somatic dysfunction of lumbar region: Secondary | ICD-10-CM | POA: Diagnosis not present

## 2023-06-23 DIAGNOSIS — M9905 Segmental and somatic dysfunction of pelvic region: Secondary | ICD-10-CM | POA: Diagnosis not present

## 2023-06-23 DIAGNOSIS — M545 Low back pain, unspecified: Secondary | ICD-10-CM | POA: Diagnosis not present

## 2023-06-23 DIAGNOSIS — M5136 Other intervertebral disc degeneration, lumbar region with discogenic back pain only: Secondary | ICD-10-CM | POA: Diagnosis not present

## 2023-07-01 DIAGNOSIS — M9905 Segmental and somatic dysfunction of pelvic region: Secondary | ICD-10-CM | POA: Diagnosis not present

## 2023-07-01 DIAGNOSIS — M5136 Other intervertebral disc degeneration, lumbar region with discogenic back pain only: Secondary | ICD-10-CM | POA: Diagnosis not present

## 2023-07-01 DIAGNOSIS — M545 Low back pain, unspecified: Secondary | ICD-10-CM | POA: Diagnosis not present

## 2023-07-01 DIAGNOSIS — M9903 Segmental and somatic dysfunction of lumbar region: Secondary | ICD-10-CM | POA: Diagnosis not present

## 2023-07-07 DIAGNOSIS — M545 Low back pain, unspecified: Secondary | ICD-10-CM | POA: Diagnosis not present

## 2023-07-07 DIAGNOSIS — M9905 Segmental and somatic dysfunction of pelvic region: Secondary | ICD-10-CM | POA: Diagnosis not present

## 2023-07-07 DIAGNOSIS — M5136 Other intervertebral disc degeneration, lumbar region with discogenic back pain only: Secondary | ICD-10-CM | POA: Diagnosis not present

## 2023-07-07 DIAGNOSIS — M9903 Segmental and somatic dysfunction of lumbar region: Secondary | ICD-10-CM | POA: Diagnosis not present

## 2023-07-26 ENCOUNTER — Ambulatory Visit: Payer: Medicare HMO | Admitting: Podiatry

## 2023-08-19 DIAGNOSIS — M5136 Other intervertebral disc degeneration, lumbar region with discogenic back pain only: Secondary | ICD-10-CM | POA: Diagnosis not present

## 2023-08-19 DIAGNOSIS — M545 Low back pain, unspecified: Secondary | ICD-10-CM | POA: Diagnosis not present

## 2023-08-19 DIAGNOSIS — M9903 Segmental and somatic dysfunction of lumbar region: Secondary | ICD-10-CM | POA: Diagnosis not present

## 2023-08-19 DIAGNOSIS — M9905 Segmental and somatic dysfunction of pelvic region: Secondary | ICD-10-CM | POA: Diagnosis not present

## 2023-08-22 ENCOUNTER — Ambulatory Visit: Payer: Self-pay | Admitting: Emergency Medicine

## 2023-08-22 ENCOUNTER — Ambulatory Visit (INDEPENDENT_AMBULATORY_CARE_PROVIDER_SITE_OTHER): Payer: Medicare HMO | Admitting: Internal Medicine

## 2023-08-22 ENCOUNTER — Encounter: Payer: Self-pay | Admitting: Internal Medicine

## 2023-08-22 VITALS — BP 136/100 | HR 74 | Temp 97.9°F | Ht 68.0 in | Wt 152.0 lb

## 2023-08-22 DIAGNOSIS — F331 Major depressive disorder, recurrent, moderate: Secondary | ICD-10-CM | POA: Diagnosis not present

## 2023-08-22 DIAGNOSIS — R7303 Prediabetes: Secondary | ICD-10-CM

## 2023-08-22 DIAGNOSIS — E785 Hyperlipidemia, unspecified: Secondary | ICD-10-CM

## 2023-08-22 DIAGNOSIS — E039 Hypothyroidism, unspecified: Secondary | ICD-10-CM | POA: Diagnosis not present

## 2023-08-22 DIAGNOSIS — I10 Essential (primary) hypertension: Secondary | ICD-10-CM

## 2023-08-22 DIAGNOSIS — F4323 Adjustment disorder with mixed anxiety and depressed mood: Secondary | ICD-10-CM

## 2023-08-22 LAB — COMPREHENSIVE METABOLIC PANEL
ALT: 10 U/L (ref 0–53)
AST: 16 U/L (ref 0–37)
Albumin: 4.6 g/dL (ref 3.5–5.2)
Alkaline Phosphatase: 56 U/L (ref 39–117)
BUN: 13 mg/dL (ref 6–23)
CO2: 30 meq/L (ref 19–32)
Calcium: 9.6 mg/dL (ref 8.4–10.5)
Chloride: 100 meq/L (ref 96–112)
Creatinine, Ser: 1.01 mg/dL (ref 0.40–1.50)
GFR: 72.93 mL/min (ref >60.00)
Glucose, Bld: 94 mg/dL (ref 70–99)
Potassium: 4 meq/L (ref 3.5–5.1)
Sodium: 137 meq/L (ref 135–145)
Total Bilirubin: 0.6 mg/dL (ref 0.2–1.2)
Total Protein: 7.8 g/dL (ref 6.0–8.3)

## 2023-08-22 LAB — LIPID PANEL
Cholesterol: 252 mg/dL — ABNORMAL HIGH (ref 0–200)
HDL: 60 mg/dL (ref >39.00)
LDL Cholesterol: 146 mg/dL — ABNORMAL HIGH (ref 0–99)
NonHDL: 192.17
Total CHOL/HDL Ratio: 4
Triglycerides: 229 mg/dL — ABNORMAL HIGH (ref 0.0–149.0)
VLDL: 45.8 mg/dL — ABNORMAL HIGH (ref 0.0–40.0)

## 2023-08-22 LAB — CBC
HCT: 43.2 % (ref 39.0–52.0)
Hemoglobin: 14.6 g/dL (ref 13.0–17.0)
MCHC: 33.8 g/dL (ref 30.0–36.0)
MCV: 86.5 fL (ref 78.0–100.0)
Platelets: 180 10*3/uL (ref 150.0–400.0)
RBC: 4.99 Mil/uL (ref 4.22–5.81)
RDW: 14.9 % (ref 11.5–15.5)
WBC: 8.1 10*3/uL (ref 4.0–10.5)

## 2023-08-22 LAB — VITAMIN D 25 HYDROXY (VIT D DEFICIENCY, FRACTURES): VITD: 23.44 ng/mL — ABNORMAL LOW (ref 30.00–100.00)

## 2023-08-22 LAB — VITAMIN B12: Vitamin B-12: 346 pg/mL (ref 211–911)

## 2023-08-22 LAB — TSH: TSH: 7.17 u[IU]/mL — ABNORMAL HIGH (ref 0.35–5.50)

## 2023-08-22 LAB — HEMOGLOBIN A1C: Hgb A1c MFr Bld: 6.1 % (ref 4.6–6.5)

## 2023-08-22 MED ORDER — TRAZODONE HCL 50 MG PO TABS: 25.0000 mg | ORAL_TABLET | Freq: Every evening | ORAL | 3 refills | Status: AC | PRN

## 2023-08-22 NOTE — Patient Instructions (Signed)
 We have sent in trazodone for sleep to use at evening as needed. If working I would recommend to take for 1-2 weeks so that you can get in a better sleep cycle.  If this does not work or help let us know we can try something different.

## 2023-08-22 NOTE — Telephone Encounter (Signed)
 Patient called this am and upon transfer from agent call disconntected. NT attempted to call patient back , no answer, LVMTCB #[phone].

## 2023-08-22 NOTE — Progress Notes (Unsigned)
   Subjective:   Patient ID: Christopher Arroyo, male    DOB: [birth year], 78 y.o.   MRN: [id number]  HPI The patient is a 78 YO man coming in for concerns about depression and sleep change. He has had a lot of stress over the last year with several moves and wife into nursing home and struggling with physical and cognitive function. He is feeling overwhelmed by everyday life tasks which now fall to him. He is not sleeping well and getting 2-3 hours per night. Tired all the time. Going on for months.   Review of Systems  Constitutional:  Positive for activity change and fatigue.  HENT: Negative.    Eyes: Negative.   Respiratory:  Negative for cough, chest tightness and shortness of breath.   Cardiovascular:  Negative for chest pain, palpitations and leg swelling.  Gastrointestinal:  Negative for abdominal distention, abdominal pain, constipation, diarrhea, nausea and vomiting.  Musculoskeletal: Negative.   Skin: Negative.   Neurological: Negative.   Psychiatric/Behavioral:  Positive for decreased concentration, dysphoric mood and sleep disturbance.     Objective:  Physical Exam Constitutional:      Appearance: He is well-developed.  HENT:     Head: Normocephalic and atraumatic.  Cardiovascular:     Rate and Rhythm: Normal rate and regular rhythm.  Pulmonary:     Effort: Pulmonary effort is normal. No respiratory distress.     Breath sounds: Normal breath sounds. No wheezing or rales.  Abdominal:     General: Bowel sounds are normal. There is no distension.     Palpations: Abdomen is soft.     Tenderness: There is no abdominal tenderness. There is no rebound.  Musculoskeletal:     Cervical back: Normal range of motion.  Skin:    General: Skin is warm and dry.  Neurological:     Mental Status: He is alert and oriented to person, place, and time.     Coordination: Coordination normal.     Vitals:   08/22/23 1337 08/22/23 1344  BP: (!) 136/100 (!) 136/100  Pulse: 74   Temp: 97.9  F (36.6 C)   TempSrc: Oral   SpO2: 96%   Weight: 152 lb (68.9 kg)   Height: 5\' 8"  (1.727 m)     Assessment & Plan:  Visit time 25 minutes in face to face communication with patient and coordination of care, additional 5 minutes spent in record review, coordination or care, ordering tests, communicating/referring to other healthcare professionals, documenting in medical records all on the same day of the visit for total time 30 minutes spent on the visit.

## 2023-08-22 NOTE — Telephone Encounter (Signed)
 Copied from CRM [id number]. Topic: Clinical - Red Word Triage >> Aug 22, 2023  9:09 AM Larwance Sachs wrote: Red Word that prompted transfer to Nurse Triage: Patient called in regarding mental health, states having ongoing "bouts" of depression and states over this last month has been very frequent and consistent, states having suicidal Ideation but does not have any intention on acting on thoughts

## 2023-08-22 NOTE — Telephone Encounter (Signed)
 Copied from CRM [id number]. Topic: Clinical - Red Word Triage >> Aug 22, 2023  9:09 AM Larwance Sachs wrote: Red Word that prompted transfer to Nurse Triage: Patient called in regarding mental health, states having ongoing "bouts" of depression and states over this last month has been very frequent and consistent, states having suicidal Ideation but does not have any intention on acting on thoughts  Chief Complaint: depression, becoming more frequent Symptoms: comes and goes, thoughts of ending life, trouble sleeping, trouble concentrating Frequency: comes and goes Pertinent Negatives: Patient denies SI, HI, cp, sob Disposition: [] ED /[] Urgent Care (no appt availability in office) / [x] Appointment(In office/virtual)/ []  [facility]/ [] Home Care/ [] Refused Recommended Disposition /[] [facility]/ []  Follow-up with PCP Additional Notes: just started medication but had bad reaction and stopped medication 2-3 years ago.  During this past month he tried to take the same medication and had reactions again.  Apt made for today; care advice given, denies questions, instructed to go to the ER if becomes worse.   Reason for Disposition  [1] Depression AND [2] worsening (e.g., sleeping poorly, less able to do activities of daily living)  Answer Assessment - Initial Assessment Questions 1. CONCERN: "What happened that made you call today?"     states having ongoing "bouts" of depression and states over this last month has been very frequent and consistent, states having suicidal Ideation but does not have any intention on acting on thoughts; on going.  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  2. DEPRESSION SYMPTOM SCREENING: "How are you feeling overall?" (e.g., decreased energy, increased sleeping or difficulty sleeping, difficulty concentrating, feelings of sadness, guilt, hopelessness, or worthlessness)     All of the above 3. RISK OF HARM - SUICIDAL IDEATION:  "Do you  ever have thoughts of hurting or killing yourself?"  (e.g., yes, no, no but preoccupation with thoughts about death)   - INTENT:  "Do you have thoughts of hurting or killing yourself right NOW?" (e.g., yes, no, N/A)   - PLAN: "Do you have a specific plan for how you would do this?" (e.g., gun, knife, overdose, no plan, N/A)     denies 4. RISK OF HARM - HOMICIDAL IDEATION:  "Do you ever have thoughts of hurting or killing someone else?"  (e.g., yes, no, no but preoccupation with thoughts about death)   - INTENT:  "Do you have thoughts of hurting or killing someone right NOW?" (e.g., yes, no, N/A)   - PLAN: "Do you have a specific plan for how you would do this?" (e.g., gun, knife, no plan, N/A)      denies 5. FUNCTIONAL IMPAIRMENT: "How have things been going for you overall? Have you had more difficulty than usual doing your normal daily activities?"  (e.g., better, same, worse; self-care, school, work, interactions)     same 6. SUPPORT: "Who is with you now?" "Who do you live with?" "Do you have family or friends who you can talk to?"      Wife is in nursing home 7. THERAPIST: "Do you have a counselor or therapist? Name?"     denies 8. STRESSORS: "Has there been any new stress or recent changes in your life?"     Wife in nursing home 9. ALCOHOL USE OR SUBSTANCE USE (DRUG USE): "Do you drink alcohol or use any illegal drugs?"     denies 10. OTHER: "Do you have any other physical symptoms right now?" (e.g., fever)  Denies  11. PREGNANCY: "Is there any chance you are pregnant?" "When was your last menstrual period?"       na  Protocols used: Depression-A-AH

## 2023-08-23 DIAGNOSIS — F329 Major depressive disorder, single episode, unspecified: Secondary | ICD-10-CM | POA: Insufficient documentation

## 2023-08-23 NOTE — Assessment & Plan Note (Signed)
 Needs follow up and checking HGA1c.

## 2023-08-23 NOTE — Assessment & Plan Note (Signed)
 Getting follow up labs today including lipid panel. Adjust crestor 20 mg daily as needed.

## 2023-08-23 NOTE — Assessment & Plan Note (Signed)
 BP is typically at goal. High today and may be related to lack of sleep in recent months. Follow up close with PCP and if persistently elevated needs adjustment. Still room to increase valsartan/hydrochlorothiazide.

## 2023-08-23 NOTE — Assessment & Plan Note (Addendum)
 He has previously been on citalopram and had bad side effects. He does not want to try any depression medications. Counseled that perhaps getting sleep would help mood. Rx trazdone 50 mg at bedtime to help with sleep. Follow up PCP 1 month or sooner if needed. Referral to counseling as he is willing to start this. Counseled about other coping skills as well. Checking labs to ensure not a metabolic cause with CBC, CMP, TSH, vitamin D and B12.

## 2023-08-23 NOTE — Assessment & Plan Note (Signed)
 With worsening mood needs TSH to assess levels. Prior not at goal. Adjust synthroid 75 mcg daily as needed.

## 2023-08-25 ENCOUNTER — Encounter: Payer: Self-pay | Admitting: Internal Medicine

## 2023-09-07 ENCOUNTER — Other Ambulatory Visit: Payer: Self-pay | Admitting: Emergency Medicine

## 2023-09-07 DIAGNOSIS — E785 Hyperlipidemia, unspecified: Secondary | ICD-10-CM

## 2023-09-09 ENCOUNTER — Other Ambulatory Visit: Payer: Self-pay | Admitting: Emergency Medicine

## 2023-09-09 DIAGNOSIS — E039 Hypothyroidism, unspecified: Secondary | ICD-10-CM

## 2023-09-12 VITALS — Ht 68.0 in

## 2023-09-12 DIAGNOSIS — Z0000 Encounter for general adult medical examination without abnormal findings: Secondary | ICD-10-CM

## 2023-09-12 NOTE — Progress Notes (Signed)
 This encounter was created in error - please disregard.  CALLED X3 WITH NO ANSWER.  LDM FOR PT TO CALL OFFICE TO RESCHEDULE.

## 2023-09-13 ENCOUNTER — Other Ambulatory Visit: Payer: Self-pay | Admitting: Internal Medicine

## 2023-09-14 ENCOUNTER — Ambulatory Visit: Payer: Medicare HMO | Admitting: Emergency Medicine

## 2023-09-19 ENCOUNTER — Encounter: Payer: Medicare HMO | Admitting: Family Medicine

## 2023-10-17 ENCOUNTER — Encounter: Payer: Self-pay | Admitting: Internal Medicine

## 2023-10-17 ENCOUNTER — Ambulatory Visit (INDEPENDENT_AMBULATORY_CARE_PROVIDER_SITE_OTHER): Admitting: Emergency Medicine

## 2023-10-17 ENCOUNTER — Other Ambulatory Visit: Payer: Self-pay

## 2023-10-17 ENCOUNTER — Observation Stay

## 2023-10-17 ENCOUNTER — Observation Stay
Admission: EM | Admit: 2023-10-17 | Discharge: 2023-10-18 | Disposition: A | Discharge status: Home / Self Care | Attending: Internal Medicine | Admitting: Internal Medicine

## 2023-10-17 ENCOUNTER — Emergency Department

## 2023-10-17 ENCOUNTER — Encounter: Payer: Self-pay | Admitting: Emergency Medicine

## 2023-10-17 VITALS — BP 150/96 | HR 67 | Temp 98.3°F | Ht 68.0 in | Wt 143.4 lb

## 2023-10-17 DIAGNOSIS — Z79899 Other long term (current) drug therapy: Secondary | ICD-10-CM | POA: Diagnosis not present

## 2023-10-17 DIAGNOSIS — I6389 Other cerebral infarction: Secondary | ICD-10-CM | POA: Diagnosis not present

## 2023-10-17 DIAGNOSIS — Z7982 Long term (current) use of aspirin: Secondary | ICD-10-CM | POA: Insufficient documentation

## 2023-10-17 DIAGNOSIS — E785 Hyperlipidemia, unspecified: Secondary | ICD-10-CM | POA: Diagnosis present

## 2023-10-17 DIAGNOSIS — F419 Anxiety disorder, unspecified: Secondary | ICD-10-CM | POA: Diagnosis not present

## 2023-10-17 DIAGNOSIS — F418 Other specified anxiety disorders: Secondary | ICD-10-CM | POA: Diagnosis not present

## 2023-10-17 DIAGNOSIS — I6782 Cerebral ischemia: Secondary | ICD-10-CM | POA: Diagnosis not present

## 2023-10-17 DIAGNOSIS — I720 Aneurysm of carotid artery: Secondary | ICD-10-CM | POA: Diagnosis not present

## 2023-10-17 DIAGNOSIS — Z87891 Personal history of nicotine dependence: Secondary | ICD-10-CM | POA: Diagnosis not present

## 2023-10-17 DIAGNOSIS — F32A Depression, unspecified: Secondary | ICD-10-CM | POA: Insufficient documentation

## 2023-10-17 DIAGNOSIS — E039 Hypothyroidism, unspecified: Secondary | ICD-10-CM

## 2023-10-17 DIAGNOSIS — I639 Cerebral infarction, unspecified: Secondary | ICD-10-CM | POA: Diagnosis not present

## 2023-10-17 DIAGNOSIS — E119 Type 2 diabetes mellitus without complications: Secondary | ICD-10-CM

## 2023-10-17 DIAGNOSIS — R2689 Other abnormalities of gait and mobility: Secondary | ICD-10-CM | POA: Diagnosis not present

## 2023-10-17 DIAGNOSIS — Z7902 Long term (current) use of antithrombotics/antiplatelets: Secondary | ICD-10-CM | POA: Insufficient documentation

## 2023-10-17 DIAGNOSIS — I671 Cerebral aneurysm, nonruptured: Secondary | ICD-10-CM | POA: Diagnosis present

## 2023-10-17 DIAGNOSIS — Z8673 Personal history of transient ischemic attack (TIA), and cerebral infarction without residual deficits: Secondary | ICD-10-CM | POA: Diagnosis not present

## 2023-10-17 DIAGNOSIS — G459 Transient cerebral ischemic attack, unspecified: Secondary | ICD-10-CM | POA: Diagnosis not present

## 2023-10-17 DIAGNOSIS — R200 Anesthesia of skin: Secondary | ICD-10-CM | POA: Diagnosis present

## 2023-10-17 DIAGNOSIS — I63512 Cerebral infarction due to unspecified occlusion or stenosis of left middle cerebral artery: Secondary | ICD-10-CM | POA: Diagnosis not present

## 2023-10-17 DIAGNOSIS — I10 Essential (primary) hypertension: Secondary | ICD-10-CM | POA: Diagnosis present

## 2023-10-17 DIAGNOSIS — R29818 Other symptoms and signs involving the nervous system: Secondary | ICD-10-CM | POA: Diagnosis not present

## 2023-10-17 DIAGNOSIS — R531 Weakness: Secondary | ICD-10-CM | POA: Diagnosis not present

## 2023-10-17 DIAGNOSIS — R29898 Other symptoms and signs involving the musculoskeletal system: Secondary | ICD-10-CM

## 2023-10-17 DIAGNOSIS — I5A Non-ischemic myocardial injury (non-traumatic): Secondary | ICD-10-CM | POA: Diagnosis not present

## 2023-10-17 DIAGNOSIS — G9389 Other specified disorders of brain: Secondary | ICD-10-CM | POA: Diagnosis not present

## 2023-10-17 LAB — URINE DRUG SCREEN, QUALITATIVE (ARMC ONLY)
Amphetamines, Ur Screen: NOT DETECTED
Barbiturates, Ur Screen: NOT DETECTED
Benzodiazepine, Ur Scrn: NOT DETECTED
Cannabinoid 50 Ng, Ur ~~LOC~~: POSITIVE — AB
Cocaine Metabolite,Ur ~~LOC~~: NOT DETECTED
MDMA (Ecstasy)Ur Screen: NOT DETECTED
Methadone Scn, Ur: NOT DETECTED
Opiate, Ur Screen: NOT DETECTED
Phencyclidine (PCP) Ur S: NOT DETECTED
Tricyclic, Ur Screen: NOT DETECTED

## 2023-10-17 LAB — DIFFERENTIAL
Abs Immature Granulocytes: 0.01 10*3/uL (ref 0.00–0.07)
Basophils Absolute: 0 10*3/uL (ref 0.0–0.1)
Basophils Relative: 1 %
Eosinophils Absolute: 0.2 10*3/uL (ref 0.0–0.5)
Eosinophils Relative: 2 %
Immature Granulocytes: 0 %
Lymphocytes Relative: 17 %
Lymphs Abs: 1.2 10*3/uL (ref 0.7–4.0)
Monocytes Absolute: 0.4 10*3/uL (ref 0.1–1.0)
Monocytes Relative: 5 %
Neutro Abs: 5.3 10*3/uL (ref 1.7–7.7)
Neutrophils Relative %: 75 %

## 2023-10-17 LAB — URINALYSIS, ROUTINE W REFLEX MICROSCOPIC
Bilirubin Urine: NEGATIVE
Glucose, UA: NEGATIVE mg/dL
Hgb urine dipstick: NEGATIVE
Ketones, ur: NEGATIVE mg/dL
Leukocytes,Ua: NEGATIVE
Nitrite: NEGATIVE
Protein, ur: NEGATIVE mg/dL
Specific Gravity, Urine: 1.009 (ref 1.005–1.030)
pH: 6 (ref 5.0–8.0)

## 2023-10-17 LAB — COMPREHENSIVE METABOLIC PANEL WITH GFR
ALT: 12 U/L (ref 0–44)
AST: 17 U/L (ref 15–41)
Albumin: 4.7 g/dL (ref 3.5–5.0)
Alkaline Phosphatase: 57 U/L (ref 38–126)
Anion gap: 9 (ref 5–15)
BUN: 9 mg/dL (ref 8–23)
CO2: 26 mmol/L (ref 22–32)
Calcium: 9.7 mg/dL (ref 8.9–10.3)
Chloride: 102 mmol/L (ref 98–111)
Creatinine, Ser: 1.03 mg/dL (ref 0.61–1.24)
GFR, Estimated: >60 mL/min (ref >60)
Glucose, Bld: 104 mg/dL — ABNORMAL HIGH (ref 70–99)
Potassium: 4.2 mmol/L (ref 3.5–5.1)
Sodium: 137 mmol/L (ref 135–145)
Total Bilirubin: 0.9 mg/dL (ref 0.0–1.2)
Total Protein: 8.3 g/dL — ABNORMAL HIGH (ref 6.5–8.1)

## 2023-10-17 LAB — ETHANOL: Alcohol, Ethyl (B): <10 mg/dL (ref <10)

## 2023-10-17 LAB — TROPONIN I (HIGH SENSITIVITY)
Troponin I (High Sensitivity): 21 ng/L — ABNORMAL HIGH (ref <18)
Troponin I (High Sensitivity): 22 ng/L — ABNORMAL HIGH (ref <18)

## 2023-10-17 LAB — APTT: aPTT: 30 s (ref 24–36)

## 2023-10-17 LAB — PROTIME-INR
INR: 1.1 (ref 0.8–1.2)
Prothrombin Time: 14.1 s (ref 11.4–15.2)

## 2023-10-17 LAB — CBC
HCT: 45.5 % (ref 39.0–52.0)
Hemoglobin: 15.3 g/dL (ref 13.0–17.0)
MCH: 28.5 pg (ref 26.0–34.0)
MCHC: 33.6 g/dL (ref 30.0–36.0)
MCV: 84.7 fL (ref 80.0–100.0)
Platelets: 162 10*3/uL (ref 150–400)
RBC: 5.37 MIL/uL (ref 4.22–5.81)
RDW: 13.8 % (ref 11.5–15.5)
WBC: 7.1 10*3/uL (ref 4.0–10.5)
nRBC: 0 % (ref 0.0–0.2)

## 2023-10-17 LAB — GLUCOSE, CAPILLARY: Glucose-Capillary: 118 mg/dL — ABNORMAL HIGH (ref 70–99)

## 2023-10-17 LAB — HEMOGLOBIN A1C
Hgb A1c MFr Bld: 5.7 % — ABNORMAL HIGH (ref 4.8–5.6)
Mean Plasma Glucose: 116.89 mg/dL

## 2023-10-17 MED ORDER — ASPIRIN 81 MG PO TBEC
81.0000 mg | DELAYED_RELEASE_TABLET | Freq: Every day | ORAL | Status: DC
  Administered 2023-10-17 – 2023-10-18 (×2): 81 mg via ORAL
  Filled 2023-10-17 (×2): qty 1

## 2023-10-17 MED ORDER — ROSUVASTATIN CALCIUM 20 MG PO TABS
40.0000 mg | ORAL_TABLET | Freq: Every day | ORAL | Status: DC
  Administered 2023-10-18: 40 mg via ORAL
  Filled 2023-10-17: qty 2

## 2023-10-17 MED ORDER — ONDANSETRON HCL 4 MG/2ML IJ SOLN: 4.0000 mg | Freq: Three times a day (TID) | INTRAMUSCULAR | Status: DC | PRN

## 2023-10-17 MED ORDER — CLOPIDOGREL BISULFATE 75 MG PO TABS
300.0000 mg | ORAL_TABLET | Freq: Once | ORAL | Status: AC
  Administered 2023-10-17: 300 mg via ORAL
  Filled 2023-10-17: qty 4

## 2023-10-17 MED ORDER — ACETAMINOPHEN 325 MG PO TABS: 650.0000 mg | ORAL_TABLET | ORAL | Status: DC | PRN

## 2023-10-17 MED ORDER — CLOPIDOGREL BISULFATE 75 MG PO TABS: 75.0000 mg | ORAL_TABLET | Freq: Every day | ORAL | Status: DC

## 2023-10-17 MED ORDER — DORZOLAMIDE HCL-TIMOLOL MAL 2-0.5 % OP SOLN
1.0000 [drp] | Freq: Two times a day (BID) | OPHTHALMIC | Status: DC
Start: 2023-10-17 — End: 2023-10-18
  Administered 2023-10-17 – 2023-10-18 (×2): 1 [drp] via OPHTHALMIC
  Filled 2023-10-17: qty 10

## 2023-10-17 MED ORDER — INSULIN ASPART 100 UNIT/ML IJ SOLN: 0.0000 [IU] | Freq: Every day | INTRAMUSCULAR | Status: DC

## 2023-10-17 MED ORDER — ASPIRIN 81 MG PO TBEC: 81.0000 mg | DELAYED_RELEASE_TABLET | Freq: Every day | ORAL | Status: DC

## 2023-10-17 MED ORDER — ENOXAPARIN SODIUM 40 MG/0.4ML IJ SOSY
40.0000 mg | PREFILLED_SYRINGE | INTRAMUSCULAR | Status: DC
  Administered 2023-10-17: 40 mg via SUBCUTANEOUS
  Filled 2023-10-17: qty 0.4

## 2023-10-17 MED ORDER — IOHEXOL 350 MG/ML SOLN
75.0000 mL | Freq: Once | INTRAVENOUS | Status: AC | PRN
Start: 2023-10-17 — End: 2023-10-17
  Administered 2023-10-17: 75 mL via INTRAVENOUS

## 2023-10-17 MED ORDER — LEVOTHYROXINE SODIUM 50 MCG PO TABS
75.0000 ug | ORAL_TABLET | Freq: Every day | ORAL | Status: DC
  Administered 2023-10-18: 75 ug via ORAL
  Filled 2023-10-17: qty 1

## 2023-10-17 MED ORDER — INSULIN ASPART 100 UNIT/ML IJ SOLN: 0.0000 [IU] | Freq: Three times a day (TID) | INTRAMUSCULAR | Status: DC

## 2023-10-17 MED ORDER — ADULT MULTIVITAMIN W/MINERALS CH
1.0000 | ORAL_TABLET | Freq: Every day | ORAL | Status: DC
  Administered 2023-10-18: 1 via ORAL
  Filled 2023-10-17: qty 1

## 2023-10-17 MED ORDER — SENNOSIDES-DOCUSATE SODIUM 8.6-50 MG PO TABS: 1.0000 | ORAL_TABLET | Freq: Every evening | ORAL | Status: DC | PRN

## 2023-10-17 MED ORDER — CLOPIDOGREL BISULFATE 75 MG PO TABS: 300.0000 mg | ORAL_TABLET | Freq: Once | ORAL | Status: DC

## 2023-10-17 MED ORDER — ACETAMINOPHEN 650 MG RE SUPP: 650.0000 mg | RECTAL | Status: DC | PRN

## 2023-10-17 MED ORDER — HYDRALAZINE HCL 20 MG/ML IJ SOLN: 5.0000 mg | INTRAMUSCULAR | Status: DC | PRN

## 2023-10-17 MED ORDER — CLOPIDOGREL BISULFATE 75 MG PO TABS
75.0000 mg | ORAL_TABLET | Freq: Every day | ORAL | Status: DC
  Administered 2023-10-18: 75 mg via ORAL
  Filled 2023-10-17: qty 1

## 2023-10-17 MED ORDER — ACETAMINOPHEN 160 MG/5ML PO SOLN: 650.0000 mg | ORAL | Status: DC | PRN

## 2023-10-17 MED ORDER — TRAZODONE HCL 50 MG PO TABS
25.0000 mg | ORAL_TABLET | Freq: Every evening | ORAL | Status: DC | PRN
  Administered 2023-10-17: 50 mg via ORAL
  Filled 2023-10-17: qty 1

## 2023-10-17 MED ORDER — STROKE: EARLY STAGES OF RECOVERY BOOK: Freq: Once | Status: AC

## 2023-10-17 NOTE — ED Notes (Signed)
 Pt skin appears wdi piv remains in place  cdi patent secured

## 2023-10-17 NOTE — ED Provider Triage Note (Signed)
 Emergency Medicine Provider Triage Evaluation Note  Christopher Arroyo , a 78 y.o. male  was evaluated in triage.  Pt complains of sent by doctor for concern of a TIA. Patient reports his symptoms began last night at 10 pm, states he was not able to take his shirt off, describes his arm as getting tangled in his shirt. Does not feel like he has returned to normal.  Hx of TIA.  Review of Systems  Positive: Tingling in both hands,  Negative: Difficulty swallowing, difficulty speaking  Physical Exam  There were no vitals taken for this visit. Gen:   Awake, no distress   Resp:  Normal effort  MSK:   Moves extremities without difficulty  Other:    Medical Decision Making  Medically screening exam initiated at 3:41 PM.  Appropriate orders placed.  Christopher Arroyo was informed that the remainder of the evaluation will be completed by another provider, this initial triage assessment does not replace that evaluation, and the importance of remaining in the ED until their evaluation is complete.     Phyliss Breen, PA-C 10/17/23 1545

## 2023-10-17 NOTE — ED Provider Notes (Signed)
 [facility] Provider Note    Event Date/Time   First MD Initiated Contact with Patient 10/17/23 1609     (approximate)   History   Transient Ischemic Attack   HPI  Christopher Arroyo is a 78 y.o. male with diabetes, anxiety, depression, hyperlipidemia, hypertension, prior history of stroke with baseline gait instability, presenting with right hand weakness.  Last normal was at 10 PM last night.  Patient states that he woke up today, was having difficulty tying his tie as well as buttoning up his shirt.  His left hand at baseline.  Thought that both hands were having issues, states that left hand now is back to baseline but right upper extremity still feels "off".  Denies any numbness, no new gait issues.  No trauma or falls.  Felt some nausea yesterday but none today.  He denies any headache, vision changes, speech difficulties, weakness or numbness to his lower extremities, no vomiting, diarrhea, chest pain, shortness of breath, cough, urinary symptoms.  On independent chart review, he was seen by his PCP today, was complaining about loss of dexterity, notices symptoms at 7 AM when he got out of bed but last normal was at 10 PM last night.  Was sent to the emergency department for further management.     Physical Exam   Triage Vital Signs: ED Triage Vitals  Encounter Vitals Group     BP 10/17/23 1546 (!) 185/107     Systolic BP Percentile --      Diastolic BP Percentile --      Pulse Rate 10/17/23 1544 76     Resp 10/17/23 1544 18     Temp 10/17/23 1544 97.7 F (36.5 C)     Temp Source 10/17/23 1544 Oral     SpO2 10/17/23 1544 98 %     Weight 10/17/23 1545 143 lb 4.8 oz (65 kg)     Height 10/17/23 1545 5\' 8"  (1.727 m)     Head Circumference --      Peak Flow --      Pain Score 10/17/23 1545 0     Pain Loc --      Pain Education --      Exclude from Growth Chart --     Most recent vital signs: Vitals:   10/17/23 1720 10/17/23 1820  BP:    Pulse:  65 69  Resp: 18 12  Temp:    SpO2: 98% 96%     General: Awake, no distress.  CV:  Good peripheral perfusion.  Resp:  Normal effort.  Abd:  No distention.  Soft nontender Other:  No lower extremity edema, no cranial nerve deficits, pupils are equal and reactive, extraocular movements are intact, no focal weakness or numbness, he has mild pronator drift on the right but no obvious weakness against resistance bilaterally, no lower extremity weakness or numbness.  Gait is stable.   ED Results / Procedures / Treatments   Labs (all labs ordered are listed, but only abnormal results are displayed) Labs Reviewed  COMPREHENSIVE METABOLIC PANEL WITH GFR - Abnormal; Notable for the following components:      Result Value   Glucose, Bld 104 (*)    Total Protein 8.3 (*)    All other components within normal limits  URINE DRUG SCREEN, QUALITATIVE ([facility] ONLY) - Abnormal; Notable for the following components:   Cannabinoid 50 Ng, Ur [place] POSITIVE (*)    All other components within normal limits  URINALYSIS, ROUTINE  W REFLEX MICROSCOPIC - Abnormal; Notable for the following components:   Color, Urine YELLOW (*)    APPearance HAZY (*)    All other components within normal limits  TROPONIN I (HIGH SENSITIVITY) - Abnormal; Notable for the following components:   Troponin I (High Sensitivity) 21 (*)    All other components within normal limits  ETHANOL  PROTIME-INR  APTT  CBC  DIFFERENTIAL  TROPONIN I (HIGH SENSITIVITY)     RADIOLOGY CT imaging on my independent interpretation without obvious intracranial hemorrhage.   PROCEDURES:  Critical Care performed: No  Procedures   MEDICATIONS ORDERED IN ED: Medications  ondansetron (ZOFRAN) injection 4 mg (has no administration in time range)  hydrALAZINE (APRESOLINE) injection 5 mg (has no administration in time range)     IMPRESSION / MDM / ASSESSMENT AND PLAN / ED COURSE  I reviewed the triage vital signs and the nursing notes.                               Differential diagnosis includes, but is not limited to, CVA, TIA, mass, electrolyte derangements.  Will get labs, EKG, troponin, UA, CT head, MRI.  Neurology consult.  Patient's presentation is most consistent with acute presentation with potential threat to life or bodily function.  Independent review of labs imaging are below.  Consulted neurology who recommended following up with MRI, if positive, admit for further workup, if negative okay to DC home with outpatient neurology follow-up.  Clinical course as below as well, given the stroke found on MRI, consulted hospitalist for admission who is agreeable to plan for admission and will evaluate the patient.  He is admitted.  Clinical Course as of 10/17/23 1847  Mon Oct 17, 2023  1827 Consulted neuro who evaluated the MRI imaging, noted left frontal lobe stroke, recommended admission to hospitalist, CT angio of the head and neck, wanted full stroke workup to include 2D echo, A1c, lipid panel and therapy assessments, they will see in the a.m. [TT]  1829 Independent review of labs, ethanol levels are not elevated, he is cannabinoid positive, troponins mildly elevated, UA is not consistent with UTI, electrolytes not severely deranged, no leukocytosis. [TT]    Clinical Course User Index [TT] Drenda Gentle, Richard Champion, MD     FINAL CLINICAL IMPRESSION(S) / ED DIAGNOSES   Final diagnoses:  Cerebrovascular accident (CVA), unspecified mechanism (HCC)  Right hand weakness     Rx / DC Orders   ED Discharge Orders     None        Note:  This document was prepared using Dragon voice recognition software and may include unintentional dictation errors.    Shane Darling, MD 10/17/23 [phone]

## 2023-10-17 NOTE — Assessment & Plan Note (Signed)
 Presently taking rosuvastatin 20 mg daily

## 2023-10-17 NOTE — Progress Notes (Signed)
 Christopher Arroyo 78 y.o.   Chief Complaint  Patient presents with   Hand Problem    Having uncontrol of his right hand movement, feeling some numbness to is and also feeling freezing cold. Issue has been present since last night, pt stated it has now progress to the left hand a little bit.    HISTORY OF PRESENT ILLNESS: This is a 78 y.o. male with history of hypertension.  Last night around 10 PM was sitting down and when he tried to get up and change shirts noticed loss of dexterity.  Unable to change shirts.  Did not sleep very well.  Got out of bed about 7 this morning unable to put tile on due to loss of dexterity in both hands.  Has history of TIA/stroke in 2011. Friend drove him for this visit.  No other associated symptoms.  HPI   Prior to Admission medications   Medication Sig Start Date End Date Taking? Authorizing Provider  amLODipine (NORVASC) 5 MG tablet TAKE 1 TABLET (5 MG TOTAL) BY MOUTH DAILY. 01/14/23  Yes Rosalena Mccorry, Eilleen Kempf, MD  [facility] RELIEF 2 % GEL APPLY 1 APPLICATION. TOPICALLY IN THE MORNING AND AT BEDTIME. 10/19/22  Yes Manaia Samad, Eilleen Kempf, MD  dorzolamide-timolol (COSOPT) 2-0.5 % ophthalmic solution INSTILL 1 DROP INTO BOTH EYES TWICE A DAY 07/20/22  Yes Virgen Belland, Eilleen Kempf, MD  levothyroxine (SYNTHROID) 75 MCG tablet TAKE 1 TABLET BY MOUTH DAILY BEFORE BREAKFAST. 09/09/23  Yes Laureen Frederic, Eilleen Kempf, MD  Multiple Vitamin (MULTIVITAMIN WITH MINERALS) TABS tablet Take 1 tablet by mouth daily.   Yes [provider]  rosuvastatin (CRESTOR) 20 MG tablet TAKE 1 TABLET BY MOUTH EVERY DAY 09/07/23  Yes Avyanna Spada, Eilleen Kempf, MD  traZODone (DESYREL) 50 MG tablet Take 0.5-1 tablets (25-50 mg total) by mouth at bedtime as needed for sleep. 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/2021   LVH (left ventricular hypertrophy) due to hypertensive disease, without heart failure 05/13/2021   Prediabetes 02/05/2021   Dyslipidemia 02/05/2021   Hepatitis C 11/24/2020   Hepatitis B core antibody positive 11/24/2020   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   TIA (transient ischemic attack) 05/27/2020   Caregiver stress syndrome 08/17/2016   Anxiety 10/11/2011   Hypothyroidism (acquired) 10/11/2011   Insomnia 10/11/2011   Primary hypertension 10/11/2011    Past Medical History:  Diagnosis Date   Anxiety and depression    Current use of long term anticoagulation    DAPT (ASA + clopidogrel)   Diabetes mellitus without complication (HCC)    History of hiatal hernia    HLD (hyperlipidemia)    Hypertension    Hypothyroidism    PSA elevation    Right internal carotid artery aneurysm    s/p pipeline embolization on 08/08/2020   T2DM (type 2 diabetes mellitus) (HCC) 09/02/2020   TIA (transient ischemic attack) 2009   left hand weakness, decrease fine motor skills    Past Surgical History:  Procedure Laterality Date   CAROTID ANGIOGRAM Right 02/20/2021   CEREBRAL ANGIOGRAM N/A 04/24/2020  Location: [facility]   SP EMBOLIZATION INTRACRANIAL Right 08/08/2020   4.75 x 14 mm pipeline embolization device (PED) placement to RIGHT ICA cavernous segment aneurysm; Location: [facility]; Surgeon: Johnsie Cancel, MD   TONSILLECTOMY      Social History   Socioeconomic History   Marital status: Married    Spouse name: Not on file   Number of children: Not on file   Years of education: Not on file   Highest education level: Not on file  Occupational History   Not on file  Tobacco Use   Smoking status: Former    Current  packs/day: 0.00    Types: Cigarettes    Quit date: 81    Years since quitting: 41.3   Smokeless tobacco: Never  Vaping Use   Vaping status: Never Used  Substance and Sexual Activity   Alcohol use: Not Currently   Drug use: 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   Review of Systems  Constitutional: Negative.  Negative for chills and fever.  HENT: Negative.  Negative for congestion and sore throat.   Respiratory: Negative.  Negative for cough and shortness of breath.   Cardiovascular: Negative.  Negative for chest pain and palpitations.  Gastrointestinal:  Negative for abdominal pain, diarrhea, nausea and vomiting.  Genitourinary: Negative.  Negative for dysuria and hematuria.  Skin: Negative.  Negative for rash.  Neurological: Negative.  Negative for dizziness and headaches.  All other systems reviewed and are negative.   Vitals:   10/17/23 1407  BP: (!) 150/96  Pulse: 67  Temp: 98.3 F (36.8 C)  SpO2: 99%    Physical Exam Vitals reviewed.  Constitutional:      Appearance: Normal appearance.  HENT:     Head: Normocephalic.     Mouth/Throat:     Mouth: Mucous membranes are moist.     Pharynx: Oropharynx is clear.  Eyes:     Extraocular Movements: Extraocular movements intact.     Pupils: Pupils are equal, round, and reactive to light.  Cardiovascular:     Rate and Rhythm: Normal rate and regular rhythm.     Pulses: Normal pulses.     Heart sounds: Normal heart sounds.  Pulmonary:     Effort: Pulmonary effort is normal.     Breath sounds: Normal  breath sounds.  Abdominal:     Palpations: Abdomen is soft.     Tenderness: There is no abdominal tenderness.  Skin:    General: Skin is warm and dry.  Neurological:     Mental Status: He is alert and oriented to person, place, and time.     Cranial Nerves: No cranial nerve deficit.     Sensory: No sensory deficit.     Motor: No weakness.     Coordination: Coordination abnormal.     Gait: Gait abnormal.  Psychiatric:        Mood and Affect: Mood normal.        Behavior: Behavior normal.  ASSESSMENT & PLAN: A total of 45 minutes was spent with the patient and counseling/coordination of care regarding preparing for this visit, review of most recent office visit notes, review of multiple chronic medical conditions and their management, diagnosis of TIA and need for emergent evaluation, review of all medications, review of most recent bloodwork results, review of health maintenance items, education on nutrition, prognosis, documentation, and need for follow up.   Problem List Items Addressed This Visit       Cardiovascular and Mediastinum   TIA (transient ischemic attack) - Primary   Symptoms started last night at around 10 PM History of TIA in the past. Cerebellar findings on physical examination Needs emergent CT and MRI of brain Referred to emergency department now for further evaluation      Uncontrolled hypertension   Elevated blood pressure reading in the office and at home Has been taking amlodipine 5 mg daily along with valsartan HCT 160-12.5 mg daily        Endocrine   Hypothyroidism (acquired)   Presently taking Synthroid 75 mcg daily        Other   Dyslipidemia   Presently taking rosuvastatin 20 mg daily      Patient Instructions  To emergency department now for further evaluation  Transient Ischemic Attack A transient ischemic attack (TIA) causes the same symptoms as a stroke, but the symptoms go away quickly. A TIA happens when blood flow to the  brain is blocked. Having a TIA means you may be at risk for a stroke. A TIA is a medical emergency. What are the causes? A TIA is caused by a blocked artery in the head or neck. This means the brain does not get the blood supply it needs. A blockage can be caused by: Fatty buildup in an artery in the head or neck. A blood clot. A tear in an artery. Irritation and swelling (inflammation) of an artery. Sometimes the cause is not known. What increases the risk? Certain things may make you more likely to have a TIA. Some of these are things that you can change, such as: Using products that have nicotine or tobacco. Not being active. Drinking too much alcohol. Using recreational drugs. Health conditions that may increase your risk include: High blood pressure. High cholesterol. Diabetes. Heart disease. A heartbeat that is not regular (atrial fibrillation). Sickle cell disease. Problems with blood clotting. Other risk factors include: Being over the age of 31. Being male. Being very overweight. Sleep problems (sleep apnea). Having a family history of stroke. Having had blood clots, stroke, TIA, or heart attack in the past. What are the signs or symptoms? The symptoms of a TIA are like those of a stroke. They can include: Weakness or loss of feeling in your face, arm, or leg. This often happens on one side of your body. Trouble walking. Trouble moving your arms or legs. Trouble talking or understanding what people are saying. Problems with how you see. Feeling dizzy. Feeling confused. Loss of balance or coordination. Feeling like you may vomit (nausea) or vomiting. Having a very bad headache. If you can, note what time you started to have symptoms. Tell your doctor. How is this treated? The goal of treatment is to lower the risk for a stroke. This may include: Changes to diet and lifestyle, such as getting regular exercise and stopping smoking. Taking medicines to: Thin the  blood. Lower blood pressure. Lower cholesterol. Treating other health conditions, such as diabetes. If testing shows that  an artery in your brain is narrow, your doctor may recommend a procedure to: Take the blockage out of your artery. Open or widen an artery in your neck (carotid angioplasty and stenting). Follow these instructions at home: Medicines Take over-the-counter and prescription medicines only as told by your doctor. If you were told to take aspirin or another medicine to thin your blood, use it exactly as told by your doctor. Taking too much of the medicine can cause bleeding. Taking too little of the medicine may not work to treat the problem. Eating and drinking  Eat 5 or more servings of fruits and vegetables each day. Follow instructions from your doctor about your diet. You may need to follow a certain diet to help lower your risk of a stroke. You may need to: Eat a diet that is low in fat and salt. Eat foods with a lot of fiber. Limit carbohydrates and sugar. If you drink alcohol: Limit how much you have to: 0-1 drink a day for women who are not pregnant. 0-2 drinks a day for men. Know how much alcohol is in a drink. In the [place] one drink equals one 12 oz bottle of beer (355 mL), one 5 oz glass of wine (148 mL), or one 1 oz glass of hard liquor (44 mL). General instructions Keep a healthy weight. Try to get at least 30 minutes of exercise on most days. Get treatment if you have sleep problems. Do not smoke or use any products that contain nicotine or tobacco. If you need help quitting, ask your doctor. Do not use drugs. Keep all follow-up visits. Your doctor will want to know if you have any more symptoms and to check blood labs if any medicines were prescribed. Where to find more information American Stroke Association: stroke.org Get help right away if: You have chest pain. You have a heartbeat that is not regular. You have any signs of a stroke. "BE FAST"  is an easy way to remember the main warning signs: B - Balance. Dizziness, sudden trouble walking, or loss of balance. E - Eyes. Trouble seeing or a change in how you see. F - Face. Sudden weakness or loss of feeling of the face. The face or eyelid may droop on one side. A - Arms. Weakness or loss of feeling in an arm. This happens all of a sudden and most often on one side of the body. S - Speech. Sudden trouble speaking, slurred speech, or trouble understanding what people say. T - Time. Time to call emergency services. Write down what time symptoms started. You have other signs of a stroke, such as: A sudden, very bad headache with no known cause. Feeling like you may vomit. Vomiting. A seizure. These symptoms may be an emergency. Get help right away. Call 911. Do not wait to see if the symptoms will go away. Do not drive yourself to the hospital. This information is not intended to replace advice given to you by your health care provider. Make sure you discuss any questions you have with your health care provider. Document Revised: 12/04/2021 Document Reviewed: 12/04/2021 Elsevier Patient Education  2024 Elsevier Inc.     Maryagnes Small, MD [facility]

## 2023-10-17 NOTE — ED Triage Notes (Signed)
 Pt here with suspicions of a TIA. Pt states this started last night while he was watching tv stating he was having trouble taking his shirt off. Pt states LKW was 2200 last night. Pt had a stroke in 2011 and has had an abnormal gait since then. Pt having numbness and tingling in his hands, more in the right hand. Pt denies slurred speech.

## 2023-10-17 NOTE — Patient Instructions (Signed)
 To emergency department now for further evaluation  Transient Ischemic Attack A transient ischemic attack (TIA) causes the same symptoms as a stroke, but the symptoms go away quickly. A TIA happens when blood flow to the brain is blocked. Having a TIA means you may be at risk for a stroke. A TIA is a medical emergency. What are the causes? A TIA is caused by a blocked artery in the head or neck. This means the brain does not get the blood supply it needs. A blockage can be caused by: Fatty buildup in an artery in the head or neck. A blood clot. A tear in an artery. Irritation and swelling (inflammation) of an artery. Sometimes the cause is not known. What increases the risk? Certain things may make you more likely to have a TIA. Some of these are things that you can change, such as: Using products that have nicotine or tobacco. Not being active. Drinking too much alcohol. Using recreational drugs. Health conditions that may increase your risk include: High blood pressure. High cholesterol. Diabetes. Heart disease. A heartbeat that is not regular (atrial fibrillation). Sickle cell disease. Problems with blood clotting. Other risk factors include: Being over the age of 106. Being male. Being very overweight. Sleep problems (sleep apnea). Having a family history of stroke. Having had blood clots, stroke, TIA, or heart attack in the past. What are the signs or symptoms? The symptoms of a TIA are like those of a stroke. They can include: Weakness or loss of feeling in your face, arm, or leg. This often happens on one side of your body. Trouble walking. Trouble moving your arms or legs. Trouble talking or understanding what people are saying. Problems with how you see. Feeling dizzy. Feeling confused. Loss of balance or coordination. Feeling like you may vomit (nausea) or vomiting. Having a very bad headache. If you can, note what time you started to have symptoms. Tell your  doctor. How is this treated? The goal of treatment is to lower the risk for a stroke. This may include: Changes to diet and lifestyle, such as getting regular exercise and stopping smoking. Taking medicines to: Thin the blood. Lower blood pressure. Lower cholesterol. Treating other health conditions, such as diabetes. If testing shows that an artery in your brain is narrow, your doctor may recommend a procedure to: Take the blockage out of your artery. Open or widen an artery in your neck (carotid angioplasty and stenting). Follow these instructions at home: Medicines Take over-the-counter and prescription medicines only as told by your doctor. If you were told to take aspirin or another medicine to thin your blood, use it exactly as told by your doctor. Taking too much of the medicine can cause bleeding. Taking too little of the medicine may not work to treat the problem. Eating and drinking  Eat 5 or more servings of fruits and vegetables each day. Follow instructions from your doctor about your diet. You may need to follow a certain diet to help lower your risk of a stroke. You may need to: Eat a diet that is low in fat and salt. Eat foods with a lot of fiber. Limit carbohydrates and sugar. If you drink alcohol: Limit how much you have to: 0-1 drink a day for women who are not pregnant. 0-2 drinks a day for men. Know how much alcohol is in a drink. In the [place] one drink equals one 12 oz bottle of beer (355 mL), one 5 oz glass of wine (148  mL), or one 1 oz glass of hard liquor (44 mL). General instructions Keep a healthy weight. Try to get at least 30 minutes of exercise on most days. Get treatment if you have sleep problems. Do not smoke or use any products that contain nicotine or tobacco. If you need help quitting, ask your doctor. Do not use drugs. Keep all follow-up visits. Your doctor will want to know if you have any more symptoms and to check blood labs if any  medicines were prescribed. Where to find more information American Stroke Association: stroke.org Get help right away if: You have chest pain. You have a heartbeat that is not regular. You have any signs of a stroke. "BE FAST" is an easy way to remember the main warning signs: B - Balance. Dizziness, sudden trouble walking, or loss of balance. E - Eyes. Trouble seeing or a change in how you see. F - Face. Sudden weakness or loss of feeling of the face. The face or eyelid may droop on one side. A - Arms. Weakness or loss of feeling in an arm. This happens all of a sudden and most often on one side of the body. S - Speech. Sudden trouble speaking, slurred speech, or trouble understanding what people say. T - Time. Time to call emergency services. Write down what time symptoms started. You have other signs of a stroke, such as: A sudden, very bad headache with no known cause. Feeling like you may vomit. Vomiting. A seizure. These symptoms may be an emergency. Get help right away. Call 911. Do not wait to see if the symptoms will go away. Do not drive yourself to the hospital. This information is not intended to replace advice given to you by your health care provider. Make sure you discuss any questions you have with your health care provider. Document Revised: 12/04/2021 Document Reviewed: 12/04/2021 Elsevier Patient Education  2024 [employer].

## 2023-10-17 NOTE — Assessment & Plan Note (Signed)
 Presently taking Synthroid 75 mcg daily

## 2023-10-17 NOTE — H&P (Signed)
 History and Physical    Christopher Arroyo ZOX:[id number] DOB: 1946/03/29 DOA: 10/17/2023  Referring MD/NP/PA:   PCP: Georgina Quint, MD   Patient coming from:  The patient is coming from home.     Chief Complaint: right hand weakness and numbness  HPI: Christopher Arroyo is a 78 y.o. 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  Pt states LKW was 2200 last night. He state that last night while he was watching TV, he started having trouble taking his shirt off, with feeling numbness in his right hand. He states that he did not sleep very well.  He got out of bed about 7 this morning and was unable to put tile on due to loss of dexterity in both hands,  the right hand is worse than the left. Pt had a stroke in 2011 and has had an abnormal gait since then.  Denies new weakness or numbness in legs.  No difficulty swallowing, speaking, vision change or hearing loss. Patient does not have chest pain, cough, SOB.  No nausea, vomiting, diarrhea or abdominal pain.  No symptoms of UTI.  No fever or chills. He was seen by his PCP this afternoon and was sent to ED for further evaluation and treatment. His symptoms have resolved at arrival to the ED.  Currently no weakness or numbness in hands.  Data reviewed independently and ED Course: pt was found to have WBC 7.1, GFR> 60, negative urinalysis, INR 1.1, PTT 30, troponin 21, UDS positive for cannabinoids, alcohol level less than 10.  Temperature normal, blood pressure 185/107, heart rate 76, RR 21, oxygen saturation 98% on room air.  CT of head is negative for acute intracranial abnormalities.  Patient is placed in telemetry bed for observation.  Dr. Wilford Corner of neurology is consulted.  MRI for brain: 1. Small acute left MCA infarcts,  predominantly affecting the perirolandic region. 2. Extensive chronic ischemia with multiple chronic infarcts as above.     EKG: Not done in ED, will get one.    Review of Systems:   General: no fevers, chills, no body weight gain, fatigue HEENT: no blurry vision, hearing changes or sore throat Respiratory: no dyspnea, coughing, wheezing CV: no chest pain, no palpitations GI: no nausea, vomiting, abdominal pain, diarrhea, constipation GU: no dysuria, burning on urination, increased urinary frequency, hematuria  Ext: no leg edema Neuro: no vision change or hearing loss. Has right hand weakness and numbness Skin: no rash, no skin tear. MSK: No muscle spasm, no deformity, no limitation of range of movement in spin Heme: No easy bruising.  Travel history: No recent long distant travel.   Allergy: No Known Allergies  Past Medical History:  Diagnosis Date   Anxiety and depression    Current use of long term anticoagulation    DAPT (ASA + clopidogrel)   Diabetes mellitus without complication (HCC)    History of hiatal hernia    HLD (hyperlipidemia)    Hypertension    Hypothyroidism    PSA elevation    Right internal carotid artery aneurysm    s/p pipeline embolization on 08/08/2020   T2DM (type 2 diabetes mellitus) (HCC) 09/02/2020   TIA (transient ischemic attack) 2009   left hand weakness, decrease fine motor skills    Past Surgical History:  Procedure Laterality Date  CAROTID ANGIOGRAM Right 02/20/2021   CEREBRAL ANGIOGRAM N/A 04/24/2020   Location: [facility]   SP EMBOLIZATION INTRACRANIAL Right 08/08/2020   4.75 x 14 mm pipeline embolization device (PED) placement to RIGHT ICA cavernous segment aneurysm; Location: [facility]; Surgeon: Johnsie Cancel, MD   TONSILLECTOMY      Social History:  reports that he quit smoking about 41 years ago. His smoking use included cigarettes. He has never used smokeless tobacco. He reports that he does not currently use alcohol. 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  Family History:  Family History  Problem Relation Age of Onset   Colon cancer Mother      Prior to Admission medications   Medication Sig Start Date End Date Taking? Authorizing Provider  amLODipine (NORVASC) 5 MG tablet TAKE 1 TABLET (5 MG TOTAL) BY MOUTH DAILY. 01/14/23   Georgina Quint, MD  [facility] RELIEF 2 % GEL APPLY 1 APPLICATION. TOPICALLY IN THE MORNING AND AT BEDTIME. 10/19/22   Georgina Quint, MD  dorzolamide-timolol (COSOPT) 2-0.5 % ophthalmic solution INSTILL 1 DROP INTO BOTH EYES TWICE A DAY 07/20/22   Georgina Quint, MD  levothyroxine (SYNTHROID) 75 MCG tablet TAKE 1 TABLET BY MOUTH DAILY BEFORE BREAKFAST. 09/09/23   Georgina Quint, MD  Multiple Vitamin (MULTIVITAMIN WITH MINERALS) TABS tablet Take 1 tablet by mouth daily.    [provider]  rosuvastatin (CRESTOR) 20 MG tablet TAKE 1 TABLET BY MOUTH EVERY DAY 09/07/23   Georgina Quint, MD  traZODone (DESYREL) 50 MG tablet Take 0.5-1 tablets (25-50 mg total) by mouth at bedtime as needed for sleep. 08/22/23   Myrlene Broker, MD  valsartan-hydrochlorothiazide (DIOVAN-HCT) 160-12.5 MG tablet TAKE 1 TABLET BY MOUTH EVERY DAY 01/15/23   Georgina Quint, MD    Physical Exam: Vitals:   10/17/23 1930 10/17/23 2000 10/17/23 2045 10/17/23 2137  BP: (!) 132/109   (!) 161/106  Pulse: 67 82 74 69  Resp: 14   18  Temp:    (!) 97.5 F (36.4 C)  TempSrc:      SpO2: 98% 99% 100% 99%  Weight:      Height:       General: Not in acute distress HEENT:       Eyes: PERRL, EOMI, no jaundice       ENT: No discharge from the ears and nose, no pharynx injection, no tonsillar enlargement.        Neck: No JVD, no mass felt. Heme: No neck lymph node enlargement. Cardiac: S1/S2, RRR, No murmurs, No gallops or rubs. Respiratory: No rales, wheezing, rhonchi or rubs. GI: Soft, nondistended, nontender, no  rebound pain, no organomegaly, BS present. GU: No hematuria Ext: No pitting leg edema bilaterally. 1+DP/PT pulse bilaterally. Musculoskeletal: No joint deformities, No joint redness or warmth, no limitation of ROM in spin. Skin: No rashes.  Neuro: Alert, oriented X3, cranial nerves II-XII grossly intact, moves all extremities normally. Muscle strength 5/5 in all extremities, sensation to light touch intact. Brachial reflex 2+ bilaterally.  Psych: Patient is not psychotic, no suicidal or hemocidal ideation.  Labs on Admission: I have personally reviewed following labs and imaging studies  CBC: Recent Labs  Lab 10/17/23 1557  WBC 7.1  NEUTROABS 5.3  HGB 15.3  HCT 45.5  MCV 84.7  PLT 162   Basic Metabolic Panel: Recent Labs  Lab 10/17/23 1557  NA 137  K 4.2  CL 102  CO2 26  GLUCOSE 104*  BUN 9  CREATININE 1.03  CALCIUM 9.7   GFR: Estimated Creatinine Clearance: 57 mL/min (by C-G formula based on SCr of 1.03 mg/dL). Liver Function Tests: Recent Labs  Lab 10/17/23 1557  AST 17  ALT 12  ALKPHOS 57  BILITOT 0.9  PROT 8.3*  ALBUMIN 4.7   No results for input(s): "LIPASE", "AMYLASE" in the last 168 hours. No results for input(s): "AMMONIA" in the last 168 hours. Coagulation Profile: Recent Labs  Lab 10/17/23 1557  INR 1.1   Cardiac Enzymes: No results for input(s): "CKTOTAL", "CKMB", "CKMBINDEX", "TROPONINI" in the last 168 hours. BNP (last 3 results) No results for input(s): "PROBNP" in the last 8760 hours. HbA1C: No results for input(s): "HGBA1C" in the last 72 hours. CBG: Recent Labs  Lab 10/17/23 2139  GLUCAP 118*   Lipid Profile: No results for input(s): "CHOL", "HDL", "LDLCALC", "TRIG", "CHOLHDL", "LDLDIRECT" in the last 72 hours. Thyroid Function Tests: No results for input(s): "TSH", "T4TOTAL", "FREET4", "T3FREE", "THYROIDAB" in the last 72 hours. Anemia Panel: No results for input(s): "VITAMINB12", "FOLATE", "FERRITIN", "TIBC", "IRON",  "RETICCTPCT" in the last 72 hours. Urine analysis:    Component Value Date/Time   COLORURINE YELLOW (A) 10/17/2023 1557   APPEARANCEUR HAZY (A) 10/17/2023 1557   APPEARANCEUR Hazy (A) 05/26/2021 1106   LABSPEC 1.009 10/17/2023 1557   PHURINE 6.0 10/17/2023 1557   GLUCOSEU NEGATIVE 10/17/2023 1557   GLUCOSEU NEGATIVE 05/13/2021 1350   HGBUR NEGATIVE 10/17/2023 1557   BILIRUBINUR NEGATIVE 10/17/2023 1557   BILIRUBINUR Negative 05/26/2021 1106   KETONESUR NEGATIVE 10/17/2023 1557   PROTEINUR NEGATIVE 10/17/2023 1557   UROBILINOGEN 0.2 05/13/2021 1350   NITRITE NEGATIVE 10/17/2023 1557   LEUKOCYTESUR NEGATIVE 10/17/2023 1557   Sepsis Labs: @LABRCNTIP (procalcitonin:4,lacticidven:4) )No results found for this or any previous visit (from the past 240 hours).   Radiological Exams on Admission:   Assessment/Plan Principal Problem:   Stroke [facility]) Active Problems:   Right internal carotid artery aneurysm   HTN (hypertension)   HLD (hyperlipidemia)   Myocardial injury   Hypothyroidism (acquired)   Depression with anxiety   Diabetes mellitus without complication (HCC)   Assessment and Plan:  Stroke [facility]): MRI showed Small acute left MCA infarcts, predominantly affecting the perirolandic region, and extensive chronic ischemia with multiple chronic infarcts. Consulted Dr. Wilford Corner of neurology  -Placed on tele bed for observation - start plavix 300 mg now and then 75 mg daily - start ASA 81 mg daily - Statin: increased dose of home Crestor from 20 to 40 mg daily - CTA of head and neck - will hold oral Bp meds to allow permissive HTN  - fasting lipid panel and HbA1c  - 2D transthoracic echocardiography  - swallowing screen. If fails, will get SLP - PT/OT consult   Hx of right internal carotid artery aneurysm: s/p of  pipeline embolization -f/u CTA of head and neck -on Plavix, aspirin, Crestor  HTN (hypertension):  -prn IV hydralazine for SBP>220 or dBP>110 -hold amlodipine  and Diovan-HCTZ  HLD (hyperlipidemia) -Crestor  Myocardial injury: Troponin 21, no chest pain.  Likely demand ischemia. - On aspirin, Plavix and Crestor - Trend troponin  Hypothyroidism (acquired) -Synthroid  Depression with anxiety -Continue home as needed trazodone  Diabetes mellitus without complication (HCC): Recent A1c 6.1, well-controlled.  Patient states that he is taking pill at home, but does not remember the name of the pill. I do not see any diabetic medication on his med  list. - Sliding scale insulin      DVT ppx: SQ Lovenox  Code Status: Full code   Family Communication:     not done, no family member is at bed side.     Disposition Plan:  Anticipate discharge back to previous environment  Consults called:  Dr. Bonnita Buttner of neurology is consulted.  Admission status and Level of care: Telemetry Medical:    for obs     Dispo: The patient is from: Home              Anticipated d/c is to: Home              Anticipated d/c date is: 1 day              Patient currently is not medically stable to d/c.    Severity of Illness:  The appropriate patient status for this patient is OBSERVATION. Observation status is judged to be reasonable and necessary in order to provide the required intensity of service to ensure the patient's safety. The patient's presenting symptoms, physical exam findings, and initial radiographic and laboratory data in the context of their medical condition is felt to place them at decreased risk for further clinical deterioration. Furthermore, it is anticipated that the patient will be medically stable for discharge from the hospital within 2 midnights of admission.        Date of Service 10/17/2023    Fidencio Hue [facility]   If 7PM-7AM, please contact night-coverage [email or web address] 10/17/2023, 9:45 PM

## 2023-10-17 NOTE — Assessment & Plan Note (Signed)
 Elevated blood pressure reading in the office and at home Has been taking amlodipine 5 mg daily along with valsartan HCT 160-12.5 mg daily

## 2023-10-17 NOTE — ED Notes (Signed)
 This RN gave report to [employer] and performed care handoff. Call light in reach, bed wheels locked, side rail raised, pt updated on plan of care. Rounding completed.

## 2023-10-17 NOTE — Assessment & Plan Note (Signed)
 Symptoms started last night at around 10 PM History of TIA in the past. Cerebellar findings on physical examination Needs emergent CT and MRI of brain Referred to emergency department now for further evaluation

## 2023-10-18 ENCOUNTER — Other Ambulatory Visit: Payer: Self-pay

## 2023-10-18 ENCOUNTER — Observation Stay (HOSPITAL_BASED_OUTPATIENT_CLINIC_OR_DEPARTMENT_OTHER)
Admit: 2023-10-18 | Discharge: 2023-10-18 | Disposition: A | Discharge status: Home / Self Care | Attending: Student in an Organized Health Care Education/Training Program | Admitting: Student in an Organized Health Care Education/Training Program

## 2023-10-18 ENCOUNTER — Other Ambulatory Visit

## 2023-10-18 DIAGNOSIS — E7849 Other hyperlipidemia: Secondary | ICD-10-CM | POA: Diagnosis not present

## 2023-10-18 DIAGNOSIS — I671 Cerebral aneurysm, nonruptured: Secondary | ICD-10-CM | POA: Diagnosis not present

## 2023-10-18 DIAGNOSIS — I6389 Other cerebral infarction: Secondary | ICD-10-CM | POA: Diagnosis not present

## 2023-10-18 DIAGNOSIS — I639 Cerebral infarction, unspecified: Secondary | ICD-10-CM | POA: Diagnosis not present

## 2023-10-18 DIAGNOSIS — E119 Type 2 diabetes mellitus without complications: Secondary | ICD-10-CM | POA: Diagnosis not present

## 2023-10-18 LAB — LIPID PANEL
Cholesterol: 197 mg/dL (ref 0–200)
HDL: 52 mg/dL (ref >40)
LDL Cholesterol: 128 mg/dL — ABNORMAL HIGH (ref 0–99)
Total CHOL/HDL Ratio: 3.8 ratio
Triglycerides: 83 mg/dL (ref <150)
VLDL: 17 mg/dL (ref 0–40)

## 2023-10-18 LAB — GLUCOSE, CAPILLARY
Glucose-Capillary: 120 mg/dL — ABNORMAL HIGH (ref 70–99)
Glucose-Capillary: 130 mg/dL — ABNORMAL HIGH (ref 70–99)

## 2023-10-18 LAB — ECHOCARDIOGRAM COMPLETE BUBBLE STUDY
AR max vel: 3.24 cm2
AV Area VTI: 3.14 cm2
AV Area mean vel: 2.84 cm2
AV Mean grad: 3 mmHg
AV Peak grad: 4.4 mmHg
Ao pk vel: 1.05 m/s
Area-P 1/2: 2.61 cm2
MV VTI: 2.26 cm2
S' Lateral: 2.8 cm

## 2023-10-18 MED ORDER — ROSUVASTATIN CALCIUM 40 MG PO TABS
40.0000 mg | ORAL_TABLET | Freq: Every day | ORAL | 0 refills | Status: AC
  Filled 2023-10-18: qty 30, 30d supply, fill #0

## 2023-10-18 MED ORDER — ASPIRIN 81 MG PO TBEC
81.0000 mg | DELAYED_RELEASE_TABLET | Freq: Every day | ORAL | 12 refills | Status: AC
  Filled 2023-10-18: qty 30, 30d supply, fill #0

## 2023-10-18 MED ORDER — ENSURE ENLIVE PO LIQD
237.0000 mL | Freq: Two times a day (BID) | ORAL | Status: DC
  Administered 2023-10-18: 237 mL via ORAL

## 2023-10-18 MED ORDER — CLOPIDOGREL BISULFATE 75 MG PO TABS
75.0000 mg | ORAL_TABLET | Freq: Every day | ORAL | 2 refills | Status: AC
  Filled 2023-10-18: qty 30, 30d supply, fill #0

## 2023-10-18 NOTE — Consult Note (Signed)
 NEUROLOGY CONSULT NOTE   Date of service: October 18, 2023 Patient Name: Christopher Arroyo MRN:  [id number] DOB:  1945-09-20 Chief Complaint: "right hand incoordination" Requesting Provider: Delfino Lovett, MD  History of Present Illness  Christopher Arroyo is a 78 y.o. male with hx of diabetes, hypertension, hypothyroidism, prior stroke with mild right-sided residual deficits presenting to the emergency department for evaluation of right hand incoordination and weakness.  He reports that he was in his usual state of health at around 8 PM on Sunday, 10/16/2023 watching the [place] golf tournament and by about 10 PM, when he went to change his clothes, he was having a hard time putting his right arm in the sleeve-he felt that his right arm was incoordinated and not obeying his command.  He did not make much of it and went to bed.  He woke up in the morning and was having hard time buttoning his shirt using the right hand and making a tie knot--something that is secondary to him and usually has no problem doing.  He came in for evaluation in the emergency department where an MRI was done that revealed an acute infarct in the left MCA territory, for which neurological consultation was obtained.  He describes his prior stroke as a TIA and does not really describe many deficits other than residual left hand incoordination from that stroke.  He remembers that that stroke was sometime in 2011, when he lived in [place] and he had trouble keeping food in his mouth that would drool whenever he ate but did not remember further details.  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  LKW: 8 PM Sunday, 10/16/2023 Modified rankin score: 1-No significant post stroke disability and can perform usual duties with stroke symptoms IV Thrombolysis: Outside the window EVT: No ELVO NIH stroke scale 0   ROS  Comprehensive ROS performed and pertinent positives documented in HPI    Past History   Past Medical History:  Diagnosis Date   Anxiety and depression    Current use of long term anticoagulation    DAPT (ASA + clopidogrel)   Diabetes mellitus without complication (HCC)    History of hiatal hernia    HLD (hyperlipidemia)    Hypertension    Hypothyroidism    PSA elevation    Right internal carotid artery aneurysm    s/p pipeline embolization on 08/08/2020   T2DM (type 2 diabetes mellitus) (HCC) 09/02/2020   TIA (transient ischemic attack) 2009   left hand weakness, decrease fine motor skills    Past Surgical History:  Procedure Laterality Date   CAROTID ANGIOGRAM Right 02/20/2021   CEREBRAL ANGIOGRAM N/A 04/24/2020   Location: [facility]   SP EMBOLIZATION INTRACRANIAL Right 08/08/2020   4.75 x 14 mm pipeline embolization device (PED) placement to RIGHT ICA cavernous segment aneurysm; Location: [facility]; Surgeon: Johnsie Cancel, MD   TONSILLECTOMY      Family History: Family History  Problem Relation Age of Onset   Colon cancer Mother     Social History  reports that he quit smoking about 41 years ago. His smoking use included cigarettes. He has never used smokeless tobacco. He reports that he does not currently use alcohol. 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  No Known Allergies  Medications   Current Facility-Administered Medications:     stroke: early stages of recovery book, , Does not  apply, Once, Lorretta Harp, MD   acetaminophen (TYLENOL) tablet 650 mg, 650 mg, Oral, Q4H PRN **OR** acetaminophen (TYLENOL) 160 MG/5ML solution 650 mg, 650 mg, Per Tube, Q4H PRN **OR** acetaminophen (TYLENOL) suppository 650 mg, 650 mg, Rectal, Q4H PRN, Lorretta Harp, MD   aspirin EC tablet 81 mg, 81 mg, Oral, Daily, Lorretta Harp, MD, 81 mg at 10/17/23 2141   clopidogrel (PLAVIX) tablet 75 mg, 75 mg, Oral, Daily, Lorretta Harp, MD   dorzolamide-timolol (COSOPT) 2-0.5 % ophthalmic solution 1 drop, 1 drop,  Both Eyes, BID, Lorretta Harp, MD, 1 drop at 10/17/23 2142   enoxaparin (LOVENOX) injection 40 mg, 40 mg, Subcutaneous, Q24H, Lorretta Harp, MD, 40 mg at 10/17/23 2141   hydrALAZINE (APRESOLINE) injection 5 mg, 5 mg, Intravenous, Q2H PRN, Lorretta Harp, MD   insulin aspart (novoLOG) injection 0-5 Units, 0-5 Units, Subcutaneous, QHS, Lorretta Harp, MD   insulin aspart (novoLOG) injection 0-9 Units, 0-9 Units, Subcutaneous, TID WC, Lorretta Harp, MD   levothyroxine (SYNTHROID) tablet 75 mcg, 75 mcg, Oral, QAC breakfast, Lorretta Harp, MD, 75 mcg at 10/18/23 1610   multivitamin with minerals tablet 1 tablet, 1 tablet, Oral, Daily, Lorretta Harp, MD   ondansetron [facility]) injection 4 mg, 4 mg, Intravenous, Q8H PRN, Lorretta Harp, MD   rosuvastatin (CRESTOR) tablet 40 mg, 40 mg, Oral, Daily, Lorretta Harp, MD   senna-docusate (Senokot-S) tablet 1 tablet, 1 tablet, Oral, QHS PRN, Lorretta Harp, MD   traZODone (DESYREL) tablet 25-50 mg, 25-50 mg, Oral, QHS PRN, Lorretta Harp, MD, 50 mg at 10/17/23 2152  Vitals   Vitals:   10/17/23 2137 10/18/23 0029 10/18/23 0507 10/18/23 0739  BP: (!) 161/106 (!) 148/95 (!) 154/99 (!) 148/94  Pulse: 69 60 (!) 57 66  Resp: 18 18 18 16   Temp: (!) 97.5 F (36.4 C) 97.7 F (36.5 C) (!) 97.5 F (36.4 C) 97.6 F (36.4 C)  TempSrc:      SpO2: 99% 100% 100% 99%  Weight: 64 kg     Height: 5\' 7"  (1.702 m)       Body mass index is 22.1 kg/m.  Physical Exam  General: Awake alert in no distress HEENT: Normocephalic atraumatic Lungs: Clear Cardiovascular: Regular rate rhythm Neurological exam Awake alert oriented x 3.  No dysarthria.  No aphasia Cranial nerves II to XII intact Motor examination with no vertical drift but has a mild right pronator drift.  Reduced finger taps on the right. Sensory examination: Intact sensation to light touch all over without extinction Coordination examination reveals no dysmetria Gait testing deferred at this time  Labs/Imaging/Neurodiagnostic studies    CBC:  Recent Labs  Lab 10/17/23 1557  WBC 7.1  NEUTROABS 5.3  HGB 15.3  HCT 45.5  MCV 84.7  PLT 162   Basic Metabolic Panel:  Lab Results  Component Value Date   NA 137 10/17/2023   K 4.2 10/17/2023   CO2 26 10/17/2023   GLUCOSE 104 (H) 10/17/2023   BUN 9 10/17/2023   CREATININE 1.03 10/17/2023   CALCIUM 9.7 10/17/2023   GFRNONAA >60 10/17/2023   Lipid Panel:  Lab Results  Component Value Date   LDLCALC 128 (H) 10/18/2023   HgbA1c:  Lab Results  Component Value Date   HGBA1C 5.7 (H) 10/17/2023   Urine Drug Screen:     Component Value Date/Time   LABOPIA NONE DETECTED 10/17/2023 1557   COCAINSCRNUR NONE DETECTED 10/17/2023 1557   LABBENZ NONE DETECTED 10/17/2023 1557   AMPHETMU NONE DETECTED 10/17/2023 1557  THCU POSITIVE (A) 10/17/2023 1557   LABBARB NONE DETECTED 10/17/2023 1557    Alcohol Level     Component Value Date/Time   ETH <10 10/17/2023 1557   INR  Lab Results  Component Value Date   INR 1.1 10/17/2023   APTT  Lab Results  Component Value Date   APTT 30 10/17/2023   CT Head without contrast(Personally reviewed): No acute findings  CT angio Head and Neck with contrast(Personally reviewed): No emergent LVO.  Enlarged ascending thoracic aorta better characterized on the CT angiogram of the chest from August 16, 2022  MRI Brain(Personally reviewed): Small acute left MCA infarcts, most notably 2.5 cm infarct in the left perirolandic region.  There is large chronic right MCA infarct with cystic encephalomalacia and there are moderate-sized chronic left MCA infarcts in the frontal and parietal lobes.  There are also small to moderate-sized bilateral occipital infarcts.  Chronic petechial blood products are associated with many of these chronic infarcts.  Mild ex vacuo dilatation of the lateral ventricles.  Small chronic bilateral cerebellar infarcts right greater than left.  Moderate cerebral atrophy.  Extensive chronic microvascular ischemic  changes Acute strokes below:   ASSESSMENT   Shain Pauwels is a 78 y.o. male with above past medical history that includes a prior stroke with minimal residual left-sided weakness now presenting with right hand weakness and incoordination.  Outside the window for IV TNKase.  No ELVO to warrant thrombectomy. MRI brain shows small acute left MCA infarcts, most notably in the perirolandic region-hand knob stroke. MRI also reveals multiple chronic infarcts including a large right MCA territory infarct and multiple others as documented above.  Impression: Acute ischemic infarct-etiology under investigation.  Given multiple prior infarcts in multiple vascular territories, strong suspicion for cardioembolic source  RECOMMENDATIONS  Admit to hospitalist Frequent rechecks Telemetry Aspirin 81+ Plavix 75 for now High intensity statin for goal LDL less than 70.  He is currently on rosuvastatin 20 at home.  LDL is 128.  Agree with increasing rosuvastatin to 40 mg to reach the goal DL above. A1c is within goal less than 7. 2D echo with bubble study pending Pending 2D echo results, I would recommend loop recorder placement for him to monitor for any evidence of underlying paroxysmal atrial fibrillation.  Will reach out to the cardiac EP team. PT OT Speech therapy Blood pressure parameters: He is nearing 72 hours from his last known well-okay to normalize blood pressures and not be permissively hypertensive. Plan discussed with Dr. Mason Sole and Suzann Riddle from the cardiac EP team Will follow  -- Signed, Tona Francis, MD [facility]

## 2023-10-18 NOTE — Plan of Care (Signed)

## 2023-10-18 NOTE — Progress Notes (Addendum)
 EP consulted to eval for ILR implant for cryptogenic stroke  MRI brain - small acute L MCA infarcts; extensive chronic ischemia with multiple chronic infarcts  TTE bubble - negative bubble  All EKGs in chart reviewed - no AFib Tele reviewed - no AFib    No MD available for ILR implant. Patient is agreeable to outpatient clinic appt soon for ILR implant.   Reviewed risks/benefits with patient, who verbalized understanding and wished to proceed with implant.   Appt scheduled for 4/22 with Dr. Daneil Dunker. Will send to precert.    Hiran Leard, NP Electrophysiology 10/18/23 4:24 PM

## 2023-10-18 NOTE — TOC Progression Note (Signed)
 Transition of Care [facility]) - Progression Note    Patient Details  Name: Christopher Arroyo MRN: [id number] Date of Birth: Dec 25, 1945  [facility] CM/SW Contact  Crayton Docker, RN 10/18/2023, 5:12 PM  Clinical Narrative:      Discharge orders noted. Patient to discharge home via private transportation arranged.   Expected Discharge Plan and Services    Home/self care     Expected Discharge Date: 10/18/23                 Social Determinants of Health (SDOH) Interventions SDOH Screenings   Food Insecurity: Food Insecurity Present (10/17/2023)  Housing: High Risk (10/17/2023)  Transportation Needs: No Transportation Needs (10/17/2023)  Utilities: At Risk (10/17/2023)  Depression (PHQ2-9): High Risk (10/17/2023)  Social Connections: Moderately Integrated (10/17/2023)  Tobacco Use: Medium Risk (10/17/2023)    Readmission Risk Interventions     No data to display

## 2023-10-18 NOTE — Plan of Care (Signed)
 Problem: Ischemic Stroke/TIA Tissue Perfusion: Goal: Complications of ischemic stroke/TIA will be minimized Outcome: Progressing  Problem: Education: Goal: Knowledge of disease or condition will improve Outcome: Progressing Goal: Knowledge of secondary prevention will improve  Outcome: Progressing Goal: Knowledge of patient specific risk factors will improve  Outcome: Progressing  Problem: Coping: Goal: Will verbalize positive feelings about self Outcome: Progressing Goal: Will identify appropriate support needs Outcome: Progressing   Problem: Health Behavior/Discharge Planning: Goal: Ability to manage health-related needs will improve Outcome: Progressing Goal: Goals will be collaboratively established with patient/family Outcome: Progressing  Problem: Self-Care: Goal: Ability to participate in self-care as condition permits will improve Outcome: Progressing Goal: Verbalization of feelings and concerns over difficulty with self-care will improve Outcome: Progressing Goal: Ability to communicate needs accurately will improve Outcome: Progressing  Problem: Nutrition: Goal: Risk of aspiration will decrease Outcome: Progressing Goal: Dietary intake will improve Outcome: Progressing

## 2023-10-18 NOTE — Evaluation (Signed)
 Speech Language Pathology Evaluation Patient Details Name: Christopher Arroyo MRN: [id number] DOB: 30-Jun-1946 Today's Date: 10/18/2023 Time: 0865-7846 SLP Time Calculation (min) (ACUTE ONLY): 24 min  Problem List:  Patient Active Problem List   Diagnosis Date Noted   Uncontrolled hypertension 10/17/2023   Stroke (HCC) 10/17/2023   HTN (hypertension) 10/17/2023   HLD (hyperlipidemia) 10/17/2023   Diabetes mellitus without complication (HCC) 10/17/2023   Depression with anxiety 10/17/2023   Myocardial injury 10/17/2023   MDD (major depressive disorder) 08/23/2023   Onychomycosis of great toe 03/17/2023   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   Cluster headaches 10/15/2021   Allergic rhinitis 10/15/2021   Chronic bilateral low back pain with bilateral sciatica 05/13/2021   LVH (left ventricular hypertrophy) due to hypertensive disease, without heart failure 05/13/2021   Prediabetes 02/05/2021   Dyslipidemia 02/05/2021   Hepatitis C 11/24/2020   Hepatitis B core antibody positive 11/24/2020   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   Caregiver stress syndrome 08/17/2016   Anxiety 10/11/2011   Hypothyroidism (acquired) 10/11/2011   Insomnia 10/11/2011   Primary hypertension 10/11/2011   Past Medical History:  Past Medical History:  Diagnosis Date   Anxiety and depression    Current use of long term anticoagulation    DAPT (ASA + clopidogrel)   Diabetes mellitus without complication (HCC)    History of hiatal hernia    HLD (hyperlipidemia)    Hypertension    Hypothyroidism    PSA elevation    Right internal carotid artery aneurysm    s/p pipeline embolization on 08/08/2020   T2DM (type 2 diabetes mellitus) (HCC) 09/02/2020   TIA (transient ischemic attack) 2009   left hand weakness, decrease fine motor skills   Past Surgical History:  Past Surgical History:  Procedure Laterality Date   CAROTID ANGIOGRAM Right 02/20/2021   CEREBRAL ANGIOGRAM N/A 04/24/2020   Location: [facility]    SP EMBOLIZATION INTRACRANIAL Right 08/08/2020   4.75 x 14 mm pipeline embolization device (PED) placement to RIGHT ICA cavernous segment aneurysm; Location: [facility]; Surgeon: Christopher America, MD   TONSILLECTOMY     HPI:  Pt is a 78 y.o. male who presents with right hand weakness and numbness. MRI:  Small acute left MCA infarcts. PMH of stroke with baseline gait instability, HTN, HLD, DM, hypothyroidism, depression with anxiety, HCV and HBV, right carotid artery aneurysm (s/p of pipeline embolization), former smoker.   Assessment / Plan / Recommendation Clinical Impression  Pt seen for cognitive-linguistic evaluation. Evaluation completed via informal means and formal screening via [employer] Mental Status Examination (SLUMS). Pt scored 28/30 on SLUMS which is [facility]. Extra time needed for processing/problem solving. Per Primary Care Physician, 08/22/23, "The patient is a 78 YO man coming in for concerns about depression and sleep change. He has had a lot of stress over the last year with several moves and wife into nursing home and struggling with physical and cognitive function. He is feeling overwhelmed by everyday life tasks which now fall to him. He is not sleeping well and getting 2-3 hours per night. Tired all the time. Going on for months." Pt may benefit from OP Neuropsychological testing given clinical presentation and recent MD note. No f/u ST services recommended at this time.    SLP Assessment  SLP Recommendation/Assessment: Patient does not need any further Speech Lanaguage Pathology Services    Recommendations for follow up therapy are one component of a multi-disciplinary discharge planning process, led by the attending physician.  Recommendations may be updated based  on patient status, additional functional criteria and insurance authorization.    Follow Up Recommendations  No SLP follow up    Assistance Recommended at Discharge   (defer to OT/PT)  Functional Status Assessment  Patient has not had a recent decline in their functional status        SLP Evaluation Cognition  Overall Cognitive Status: Within Functional Limits for tasks assessed Orientation Level: Oriented X4 Attention: Focused;Sustained Focused Attention: Appears intact Sustained Attention: Appears intact Memory: Appears intact Awareness: Appears intact Problem Solving: Appears intact Executive Function: Self Monitoring;Sequencing;Organizing Sequencing: Appears intact Organizing: Appears intact Self Monitoring: Appears intact Safety/Judgment: Appears intact Comments: extra time for processing/problem solving       Comprehension  Auditory Comprehension Overall Auditory Comprehension: Appears within functional limits for tasks assessed    Expression Expression Primary Mode of Expression: Verbal Verbal Expression Overall Verbal Expression: Appears within functional limits for tasks assessed   Oral / Motor  Oral Motor/Sensory Function Overall Oral Motor/Sensory Function: Within functional limits Motor Speech Overall Motor Speech: Appears within functional limits for tasks assessed           Dia Forget, M.S., CCC-SLP Speech-Language Pathologist [facility] [phone] (ASCOM)  Adin Honour 10/18/2023, 9:14 AM

## 2023-10-18 NOTE — Care Management Obs Status (Signed)
 MEDICARE OBSERVATION STATUS NOTIFICATION   Patient Details  Name: Christopher Arroyo MRN: [id number] Date of Birth: 31-Jan-1946   Medicare Observation Status Notification Given:  Rudolph Cost, CMA 10/18/2023, 2:28 PM

## 2023-10-18 NOTE — Evaluation (Signed)
 Physical Therapy Evaluation Patient Details Name: Christopher Arroyo MRN: [id number] DOB: Nov 05, 1945 Today's Date: 10/18/2023  History of Present Illness  Pt is a 78 y.o. male who presents with right hand weakness and numbness. MRI:  Small acute left MCA infarcts. PMH of stroke with baseline gait instability, HTN, HLD, DM, hypothyroidism, depression with anxiety, HCV and HBV, right carotid artery aneurysm (s/p of pipeline embolization), former smoker.  Clinical Impression  Pt is a pleasant 78 year old male who was admitted for CVA. Pt performs transfers with supervision and ambulation with cga and no AD. Pt demonstrates deficits with balance- baseline from previous CVA. Would benefit from skilled PT to address above deficits and promote optimal return to PLOF and improve confidence with gait/mobility. Pt will continue to receive skilled PT services while admitted and will defer to TOC/care team for updates regarding disposition planning.       If plan is discharge home, recommend the following: A little help with walking and/or transfers;Assist for transportation   Can travel by private vehicle        Equipment Recommendations None recommended by PT  Recommendations for Other Services       Functional Status Assessment Patient has had a recent decline in their functional status and demonstrates the ability to make significant improvements in function in a reasonable and predictable amount of time.     Precautions / Restrictions Precautions Precautions: Fall Recall of Precautions/Restrictions: Intact Restrictions Weight Bearing Restrictions Per Provider Order: No      Mobility  Bed Mobility               General bed mobility comments: received sitting at EOB upon arrival. Hand off from OT    Transfers Overall transfer level: Needs assistance Equipment used: 1 person hand held assist Transfers: Sit to/from Stand Sit to Stand: Supervision           General transfer  comment: safe technique. Initially used SPC, however not needed    Ambulation/Gait Ambulation/Gait assistance: Contact guard assist Gait Distance (Feet): 200 Feet Assistive device: None Gait Pattern/deviations: Step-through pattern       General Gait Details: with distraction, unsteadiness noted and pt needs slight cga to recover. Reciprocal gait pattern with upright posture and good speed. No AD required  Stairs            Wheelchair Mobility     Tilt Bed    Modified Rankin (Stroke Patients Only)       Balance Overall balance assessment: Mild deficits observed, not formally tested                                           Pertinent Vitals/Pain Pain Assessment Pain Assessment: No/denies pain    Home Living Family/patient expects to be discharged to:: Private residence Living Arrangements: Alone Available Help at Discharge: Family (son lives in [place]) Type of Home: House Home Access: Stairs to enter Entrance Stairs-Rails: None Secretary/administrator of Steps: 1   Home Layout: One level Home Equipment: Grab bars - tub/shower;Cane - single point      Prior Function Prior Level of Function : Independent/Modified Independent;Driving;History of Falls (last six months)             Mobility Comments: IND, reports occasional use of walking stick/SPC; 1 fall when he missed the curb ADLs Comments: IND; grab bars in shower  Extremity/Trunk Assessment   Upper Extremity Assessment Upper Extremity Assessment: Defer to OT evaluation RUE Deficits / Details: RUE ROM limitations at baseline from injury as a kid; RUE coordination and slowed dexterity; sensation intact, but reports the arm goes "limp" at times RUE Coordination: decreased fine motor;decreased gross motor    Lower Extremity Assessment Lower Extremity Assessment: Overall WFL for tasks assessed       Communication   Communication Communication: No apparent  difficulties    Cognition Arousal: Alert Behavior During Therapy: WFL for tasks assessed/performed   PT - Cognitive impairments: No apparent impairments                       PT - Cognition Comments: very pleasant and agreeable to session Following commands: Intact       Cueing Cueing Techniques: Verbal cues     General Comments      Exercises Other Exercises Other Exercises: ambulated with supervision to bathroom with safe technique. INdep with toileting and hand hygiene   Assessment/Plan    PT Assessment Patient needs continued PT services  PT Problem List Decreased activity tolerance;Decreased balance;Decreased mobility       PT Treatment Interventions DME instruction;Gait training;Therapeutic exercise;Balance training    PT Goals (Current goals can be found in the Care Plan section)  Acute Rehab PT Goals Patient Stated Goal: to go home PT Goal Formulation: With patient Time For Goal Achievement: 11/01/23 Potential to Achieve Goals: Good    Frequency Min 1X/week     Co-evaluation               AM-PAC PT "6 Clicks" Mobility  Outcome Measure Help needed turning from your back to your side while in a flat bed without using bedrails?: None Help needed moving from lying on your back to sitting on the side of a flat bed without using bedrails?: None Help needed moving to and from a bed to a chair (including a wheelchair)?: A Little Help needed standing up from a chair using your arms (e.g., wheelchair or bedside chair)?: A Little Help needed to walk in hospital room?: A Little Help needed climbing 3-5 steps with a railing? : A Little 6 Click Score: 20    End of Session Equipment Utilized During Treatment: Gait belt Activity Tolerance: Patient tolerated treatment well Patient left: in bed (seated at EOB) Nurse Communication: Mobility status PT Visit Diagnosis: Unsteadiness on feet (R26.81);Difficulty in walking, not elsewhere classified (R26.2)     Time: 1610-9604 PT Time Calculation (min) (ACUTE ONLY): 11 min   Charges:   PT Evaluation $PT Eval Low Complexity: 1 Low   PT General Charges $$ ACUTE PT VISIT: 1 Visit         Amparo Balk, PT, DPT, GCS [phone]   Jamacia Jester 10/18/2023, 12:29 PM

## 2023-10-18 NOTE — Evaluation (Signed)
 Occupational Therapy Evaluation Patient Details Name: Christopher Arroyo MRN: [id number] DOB: 1946-03-27 Today's Date: 10/18/2023   History of Present Illness   Pt is a 78 y.o. male who presents with right hand weakness and numbness. MRI:  Small acute left MCA infarcts. PMH of stroke with baseline gait instability, HTN, HLD, DM, hypothyroidism, depression with anxiety, HCV and HBV, right carotid artery aneurysm (s/p of pipeline embolization), former smoker.     Clinical Impressions Pt was seen for OT evaluation this date. Prior to hospital admission, pt was living at home alone where he was IND, driving and reports occasional use of a walking stick when he feels unsteady. 1 fall where he fell over a curb within the last 6 months.   Pt presents to acute OT demonstrating impaired ADL performance and functional mobility 2/2 RUE coordination and dexterity deficits with mild weakness. Pt is L hand dominant and reports RUE stiffness/ROM deficits that at chronic from a childhood injury. Pt currently requires MOD I with bed mobility. Good seated balance. Able to don socks with MOD I/increased time and reports minimal difficulty. Able to simulate oral care and opening toothbrush and toothpaste with IND, minimal difficulty noted. Pt with increased difficulty with UB dressing requiring Mod A to don long sleeve, tighter fitting shirt even with cueing. Pt reports his RUE will go "limp" at times. Sensation intact. Provided FMC/GMC exercises to perform daily to maximize RUE functional use and coordination/dexterity.  Pt would benefit from skilled OT services to address noted impairments and functional limitations to maximize safety and independence. Recommend outpatient OT on DC if pt is able to arrange transportation.     If plan is discharge home, recommend the following:   A little help with bathing/dressing/bathroom;A little help with walking and/or transfers;Assistance with cooking/housework     Functional  Status Assessment   Patient has had a recent decline in their functional status and demonstrates the ability to make significant improvements in function in a reasonable and predictable amount of time.     Equipment Recommendations   None recommended by OT     Recommendations for Other Services         Precautions/Restrictions   Precautions Precautions: Fall Restrictions Weight Bearing Restrictions Per Provider Order: No     Mobility Bed Mobility Overal bed mobility: Modified Independent                  Transfers                   General transfer comment: handoff to PT      Balance Overall balance assessment: Mild deficits observed, not formally tested (good seated balance; reports mild balance deficits at baseline at times)                                         ADL either performed or assessed with clinical judgement   ADL Overall ADL's : Needs assistance/impaired     Grooming: Oral care;Sitting;Supervision/safety Grooming Details (indicate cue type and reason): increased time to manage toothpaste and opening toothbrush from plastic d/t R hand deficits         Upper Body Dressing : Moderate assistance;Sitting Upper Body Dressing Details (indicate cue type and reason): unable to get RUE into arm hole even when dressing it first, he pulled it back out once his LUE was in Lower Body Dressing: Supervision/safety;Sitting/lateral leans  Lower Body Dressing Details (indicate cue type and reason): to doff/don socks, increased time and mild difficulty noted per pt                     Vision Baseline Vision/History: 1 Wears glasses;3 Glaucoma       Perception         Praxis         Pertinent Vitals/Pain Pain Assessment Pain Assessment: No/denies pain     Extremity/Trunk Assessment Upper Extremity Assessment Upper Extremity Assessment: Left hand dominant;RUE deficits/detail RUE Deficits / Details: RUE ROM  limitations at baseline from injury as a kid; RUE coordination and slowed dexterity; sensation intact, but reports the arm goes "limp" at times RUE Coordination: decreased fine motor;decreased gross motor   Lower Extremity Assessment Lower Extremity Assessment: Defer to PT evaluation       Communication Communication Communication: No apparent difficulties   Cognition Arousal: Alert Behavior During Therapy: WFL for tasks assessed/performed Cognition: No apparent impairments                               Following commands: Intact       Cueing  General Comments   Cueing Techniques: Verbal cues      Exercises Other Exercises Other Exercises: Edu on role of OT in acute setting.   Shoulder Instructions      Home Living Family/patient expects to be discharged to:: Private residence Living Arrangements: Alone Available Help at Discharge: Family (son lives in [place]) Type of Home: House Home Access: Stairs to enter Secretary/administrator of Steps: 1 Entrance Stairs-Rails: None Home Layout: One level     Bathroom Shower/Tub: Producer, television/film/video: Standard     Home Equipment: Grab bars - tub/shower;Jeananne Mighty - single point      Lives With: Alone    Prior Functioning/Environment Prior Level of Function : Independent/Modified Independent;Driving;History of Falls (last six months)             Mobility Comments: IND, reports occasional use of walking stick/SPC; 1 fall when he missed the curb ADLs Comments: IND; grab bars in shower    OT Problem List: Decreased strength;Decreased coordination;Impaired balance (sitting and/or standing)   OT Treatment/Interventions: Self-care/ADL training;Balance training;Therapeutic exercise;Therapeutic activities;Patient/family education      OT Goals(Current goals can be found in the care plan section)   Acute Rehab OT Goals Patient Stated Goal: return home/regain functional use of RUE OT Goal  Formulation: With patient Time For Goal Achievement: 11/01/23 Potential to Achieve Goals: Good ADL Goals Pt Will Perform Grooming: with set-up;sitting Pt Will Perform Upper Body Bathing: with set-up;sitting Pt Will Perform Upper Body Dressing: with set-up;sitting Additional ADL Goal #1: Pt will demo improved R hand coordination and increased dexterity speed during ADL performance 2/2 trials while donning shirt.   OT Frequency:  Min 2X/week    Co-evaluation              AM-PAC OT "6 Clicks" Daily Activity     Outcome Measure Help from another person eating meals?: None Help from another person taking care of personal grooming?: A Little Help from another person toileting, which includes using toliet, bedpan, or urinal?: A Little Help from another person bathing (including washing, rinsing, drying)?: A Little Help from another person to put on and taking off regular upper body clothing?: A Lot Help from another person to put on and taking off regular  lower body clothing?: A Little 6 Click Score: 18   End of Session    Activity Tolerance: Patient tolerated treatment well Patient left: in bed (with handoff to PT)  OT Visit Diagnosis: Other abnormalities of gait and mobility (R26.89);Muscle weakness (generalized) (M62.81)                Time: 4782-9562 OT Time Calculation (min): 19 min Charges:  OT General Charges $OT Visit: 1 Visit OT Evaluation $OT Eval Low Complexity: 1 Low Azile Minardi, OTR/L 10/18/23, 11:26 AM  Kobie Whidby E Shantale Holtmeyer 10/18/2023, 11:22 AM

## 2023-10-18 NOTE — Discharge Summary (Signed)
 Physician Discharge Summary   Patient: Christopher Arroyo MRN: [id number] DOB: 09-18-1945  Admit date:     10/17/2023  Discharge date: 10/18/23  Discharge Physician: Delfino Lovett   PCP: Georgina Quint, MD   Recommendations at discharge:   Follow-up with PCP and neurology as requested  Discharge Diagnoses: Principal Problem:   Stroke [facility]) Active Problems:   Right internal carotid artery aneurysm   HTN (hypertension)   HLD (hyperlipidemia)   Myocardial injury   Hypothyroidism (acquired)   Depression with anxiety   Diabetes mellitus without complication [facility])  Hospital Course: Assessment and Plan:  78 y.o. male with hx of diabetes, hypertension, hypothyroidism, prior stroke with mild right-sided residual deficits presenting to the emergency department for evaluation of right hand incoordination and weakness   Stroke [facility]): MRI showed Small acute left MCA infarcts, predominantly affecting the perirolandic region, and extensive chronic ischemia with multiple chronic infarcts.  Evaluated by Dr. Wilford Corner of neurology   CT angio head and neck showed no emergent LVO. Neurology recommends aspirin 81 mg and Plavix 75 mg for now.  Increase rosuvastatin to 40 mg to reach target LDL goal. 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  Likely demand ischemia. - On aspirin, Plavix and Crestor   Hypothyroidism (acquired) -Synthroid   Depression with anxiety -Continue home as needed trazodone   Diabetes mellitus without complication (HCC): Recent A1c 6.1, well-controlled.             Consultants: Neurology Disposition:  Home Diet recommendation:  Discharge Diet Orders (From admission, onward)     Start     Ordered   10/18/23 0000  Diet - low sodium heart healthy        10/18/23 1600           Carb modified diet DISCHARGE MEDICATION: Allergies as of 10/18/2023   No Known Allergies      Medication List     TAKE these medications    amLODipine 5 MG tablet Commonly known as: NORVASC TAKE 1 TABLET (5 MG TOTAL) BY MOUTH DAILY.   aspirin EC 81 MG tablet Take 1 tablet (81 mg total) by mouth daily. Swallow whole. Start taking on: October 19, 2023   clopidogrel 75 MG tablet Commonly known as: PLAVIX Take 1 tablet (75 mg total) by mouth daily. Start taking on: October 19, 2023   [facility] Itch Relief 2 % Gel Generic drug: DIPHENHYDRAMINE HCL (TOPICAL) APPLY 1 APPLICATION. TOPICALLY IN THE MORNING AND AT BEDTIME.   dorzolamide-timolol 2-0.5 % ophthalmic solution Commonly known as: COSOPT INSTILL 1 DROP INTO BOTH EYES TWICE A DAY   levothyroxine 75 MCG tablet Commonly known as: SYNTHROID TAKE 1 TABLET BY MOUTH DAILY BEFORE BREAKFAST.   multivitamin with minerals Tabs tablet Take 1 tablet by mouth daily.   rosuvastatin 40 MG tablet Commonly known as: CRESTOR Take 1 tablet (40 mg total) by mouth daily. Start taking on: October 19, 2023 What changed:  medication strength how much to take   traZODone 50 MG tablet Commonly known as: DESYREL Take 0.5-1 tablets (25-50 mg total) by mouth at bedtime as needed for sleep.   valsartan-hydrochlorothiazide 160-12.5 MG  tablet Commonly known as: DIOVAN-HCT TAKE 1 TABLET BY MOUTH EVERY DAY        Follow-up Information     Georgina Quint, MD. Schedule an appointment as soon as possible for a visit in 1 week(s).   Specialty: Internal Medicine Why: Hospital follow up, [facility] Discharge F/UP Contact information: [address] [place] [address] [phone]         Lonell Face, MD. Schedule an appointment as soon as  possible for a visit in 2 week(s).   Specialty: Neurology Why: [facility] Discharge F/UP Contact information: [address] [facility] [place] [address] [phone]                Discharge Exam: Ceasar Mons Weights   10/17/23 1545 10/17/23 2137  Weight: 65 kg 40 kg   78 year old male lying in the bed comfortably without any acute distress Lungs clear to auscultation bilaterally Heart regular rate and rhythm Abdomen soft, benign Skin no rash or lesion Neuro alert and awake, nonfocal exam.  He does have mild right pronator drift  Condition at discharge: good  The results of significant diagnostics from this hospitalization (including imaging, microbiology, ancillary and laboratory) are listed below for reference.   Imaging Studies: ECHOCARDIOGRAM COMPLETE BUBBLE STUDY Result Date: 10/18/2023    ECHOCARDIOGRAM REPORT   Patient Name:   Christopher Arroyo Date of Exam: 10/18/2023 Medical Rec #:  [id number]    Height:       67.0 in Accession #:    [id number]   Weight:       141.1 lb Date of Birth:  1946/04/04   BSA:          1.744 m Patient Age:    77 years     BP:           158/99 mmHg Patient Gender: M            HR:           74 bpm. Exam Location:  [facility] Procedure: 2D Echo, Cardiac Doppler, Color Doppler and Saline Contrast Bubble            Study (Both Spectral and Color Flow Doppler were utilized during            procedure). Indications:     Stroke 434.91 / I63.9  History:         Patient has no prior history of Echocardiogram examinations.                  TIA; Risk Factors:Diabetes and Hypertension.  Sonographer:     Cristela Blue Referring Phys:  [id number] ASHISH ARORA Diagnosing Phys: Rozell Searing Custovic IMPRESSIONS  1. Left ventricular ejection fraction, by estimation, is 60 to 65%. The left ventricle has normal function. The left ventricle has no regional wall motion abnormalities. Left ventricular diastolic parameters are consistent with Grade I diastolic  dysfunction (impaired relaxation).  2. Right ventricular systolic function is normal. The right ventricular size is normal.  3. The mitral valve is normal in structure. Mild mitral valve regurgitation. No evidence of mitral stenosis.  4. 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 Aortic valve regurgitation is mild. No aortic stenosis is present.  5. Aortic dilatation noted. There is mild dilatation of the ascending aorta.  6. The inferior vena cava is normal in size with greater than 50% respiratory variability, suggesting right atrial pressure of 3 mmHg. FINDINGS  Left Ventricle: Left  ventricular ejection fraction, by estimation, is 60 to 65%. The left ventricle has normal function. The left ventricle has no regional wall motion abnormalities. The left ventricular internal cavity size was normal in size. There is  no left ventricular hypertrophy. Left ventricular diastolic parameters are consistent with Grade I diastolic dysfunction (impaired relaxation). Right Ventricle: The right ventricular size is normal. No increase in right ventricular wall thickness. Right ventricular systolic function is normal. Left Atrium: Left atrial size was normal in size. Right Atrium: Right atrial size was normal in size. Pericardium: There is no evidence of pericardial effusion. Mitral Valve: The mitral valve is normal in structure. Mild mitral valve regurgitation. No evidence of mitral valve stenosis. MV peak gradient, 5.8 mmHg. The mean mitral valve gradient is 2.0 mmHg. Tricuspid Valve: The tricuspid valve is normal in structure. Tricuspid valve regurgitation is trivial. 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 Aortic valve regurgitation is mild. No aortic stenosis is present. Aortic valve mean gradient measures 3.0 mmHg. Aortic valve peak gradient measures 4.4 mmHg. Aortic valve area, by VTI measures 3.14 cm. Pulmonic Valve: The  pulmonic valve was normal in structure. Pulmonic valve regurgitation is not visualized. Aorta: Aortic dilatation noted. There is mild dilatation of the ascending aorta. Venous: The inferior vena cava is normal in size with greater than 50% respiratory variability, suggesting right atrial pressure of 3 mmHg. IAS/Shunts: No atrial level shunt detected by color flow Doppler. Agitated saline contrast was given intravenously to evaluate for intracardiac shunting.  LEFT VENTRICLE PLAX 2D LVIDd:         4.30 cm   Diastology LVIDs:         2.80 cm   LV e' medial:    5.44 cm/s LV PW:         1.10 cm   LV E/e' medial:  9.5 LV IVS:        0.90 cm   LV e' lateral:   5.11 cm/s LVOT diam:     2.20 cm   LV E/e' lateral: 10.2 LV SV:         56 LV SV Index:   32 LVOT Area:     3.80 cm  RIGHT VENTRICLE RV Basal diam:  2.50 cm RV Mid diam:    2.10 cm RV S prime:     12.90 cm/s TAPSE (M-mode): 2.0 cm LEFT ATRIUM             Index       RIGHT ATRIUM           Index LA diam:        2.70 cm 1.55 cm/m  RA Area:     10.00 cm LA Vol (A2C):   14.3 ml 8.20 ml/m  RA Volume:   20.60 ml  11.81 ml/m LA Vol (A4C):   11.9 ml 6.83 ml/m LA Biplane Vol: 14.0 ml 8.03 ml/m  AORTIC VALVE AV Area (Vmax):    3.24 cm AV Area (Vmean):   2.84 cm AV Area (VTI):     3.14 cm AV Vmax:           105.00 cm/s AV Vmean:          75.700 cm/s AV VTI:            0.179 m AV Peak Grad:      4.4 mmHg AV Mean Grad:      3.0 mmHg LVOT Vmax:  89.40 cm/s LVOT Vmean:        56.600 cm/s LVOT VTI:          0.148 m LVOT/AV VTI ratio: 0.83  AORTA Ao Root diam: 4.15 cm MITRAL VALVE                TRICUSPID VALVE MV Area (PHT): 2.61 cm     TR Peak grad:   16.6 mmHg MV Area VTI:   2.26 cm     TR Vmax:        204.00 cm/s MV Peak grad:  5.8 mmHg MV Mean grad:  2.0 mmHg     SHUNTS MV Vmax:       1.20 m/s     Systemic VTI:  0.15 m MV Vmean:      55.7 cm/s    Systemic Diam: 2.20 cm MV Decel Time: 291 msec MV E velocity: 51.90 cm/s MV A velocity: 104.00 cm/s MV E/A ratio:   0.50 Sabina Custovic Electronically signed by Isabell Manzanilla Signature Date/Time: 10/18/2023/3:58:59 PM    Final    CT Angio Head Neck W WO CM Result Date: 10/17/2023 CLINICAL DATA:  Stroke/TIA, determine embolic source EXAM: CT ANGIOGRAPHY HEAD AND NECK WITH AND WITHOUT CONTRAST TECHNIQUE: Multidetector CT imaging of the head and neck was performed using the standard protocol during bolus administration of intravenous contrast. Multiplanar CT image reconstructions and MIPs were obtained to evaluate the vascular anatomy. Carotid stenosis measurements (when applicable) are obtained utilizing NASCET criteria, using the distal internal carotid diameter as the denominator. RADIATION DOSE REDUCTION: This exam was performed according to the departmental dose-optimization program which includes automated exposure control, adjustment of the mA and/or kV according to patient size and/or use of iterative reconstruction technique. CONTRAST:  75mL OMNIPAQUE IOHEXOL 350 MG/ML SOLN COMPARISON:  Same day CT head.  CTA chest August 16, 2022. FINDINGS: CTA NECK FINDINGS Aortic arch: Great vessel origins are patent. Enlarged ascending thoracic aorta, better characterized on CTA of the chest from February 12, 24. Right carotid system: No evidence of dissection, stenosis (50% or greater), or occlusion. Left carotid system: No evidence of dissection, stenosis (50% or greater), or occlusion. Vertebral arteries: Left dominant. No evidence of dissection, stenosis (50% or greater), or occlusion. Skeleton: No acute abnormality on limited assessment. Other neck: No acute abnormality on limited assessment. Upper chest: Emphysema. Review of the MIP images confirms the above findings CTA HEAD FINDINGS Anterior circulation: Bilateral intracranial ICAs are patent. Right ICA stent is patent. Bilaterally MCAs and ACAs are patent without proximal hemodynamically significant stenosis. Posterior circulation: Bilateral intradural vertebral  arteries, basilar artery and bilateral posterior sphenoid are patent without proximal hemodynamically significant stenosis. Venous sinuses: As permitted by contrast timing, patent. Review of the MIP images confirms the above findings IMPRESSION: 1. No emergent large vessel occlusion or proximal hemodynamically significant stenosis. 2. Enlarged ascending thoracic aorta, better characterized on CTA of the chest from February 12, 24. Electronically Signed   By: Stevenson Elbe M.D.   On: 10/17/2023 22:26   MR BRAIN WO CONTRAST Result Date: 10/17/2023 CLINICAL DATA:  Neuro deficit, acute, stroke suspected. EXAM: MRI HEAD WITHOUT CONTRAST TECHNIQUE: Multiplanar, multiecho pulse sequences of the brain and surrounding structures were obtained without intravenous contrast. COMPARISON:  Head CT 10/17/2023 FINDINGS: Brain: There are small acute left MCA infarcts, most notably a 2.5 cm infarct in the left perirolandic region. There is a large chronic right MCA infarct with cystic encephalomalacia, and there are moderate-sized chronic left MCA infarcts in  the frontal and parietal lobes. There are also small to moderate-sized bilateral occipital infarcts. Chronic petechial blood products are associated with many of the chronic infarcts. There is mild ex vacuo dilatation of the lateral ventricles. There are small chronic bilateral cerebellar infarcts, right larger than left. There is moderate cerebral atrophy. No mass, midline shift, or extra-axial fluid collection is identified. Vascular: Major intracranial vascular flow voids are preserved. Skull and upper cervical spine: Unremarkable bone marrow signal. Sinuses/Orbits: Unremarkable orbits. Paranasal sinuses and mastoid air cells are clear. Other: None. IMPRESSION: 1. Small acute left MCA infarcts, predominantly affecting the perirolandic region. 2. Extensive chronic ischemia with multiple chronic infarcts as above. Electronically Signed   By: Aundra Lee M.D.   On:  10/17/2023 20:30   CT HEAD WO CONTRAST Result Date: 10/17/2023 CLINICAL DATA:  Neuro deficit, acute, stroke suspected EXAM: CT HEAD WITHOUT CONTRAST TECHNIQUE: Contiguous axial images were obtained from the base of the skull through the vertex without intravenous contrast. RADIATION DOSE REDUCTION: This exam was performed according to the departmental dose-optimization program which includes automated exposure control, adjustment of the mA and/or kV according to patient size and/or use of iterative reconstruction technique. COMPARISON:  None Available. FINDINGS: Brain: There is periventricular white matter decreased attenuation consistent with small vessel ischemic changes. Ventricles, sulci and cisterns are prominent consistent with age related involutional changes. No acute intracranial hemorrhage, mass effect or shift. No hydrocephalus. Encephalomalacia consistent with chronic right MCA and left PCA CVAs. Vascular: No hyperdense vessel or unexpected calcification. Skull: Normal. Negative for fracture or focal lesion. Sinuses/Orbits: No acute finding. IMPRESSION: Atrophy and chronic small vessel ischemic changes. Chronic CVAs. No acute intracranial process identified. Electronically Signed   By: Sydell Eva M.D.   On: 10/17/2023 19:52    Microbiology: Results for orders placed or performed in visit on 05/26/21  Microscopic Examination     Status: Abnormal   Collection Time: 05/26/21 11:06 AM   Urine  Result Value Ref Range Status   WBC, UA 0-5 0 - 5 /hpf Final   RBC, Urine 0-2 0 - 2 /hpf Final   Epithelial Cells (non renal) 0-10 0 - 10 /hpf Final   Casts Present (A) None seen /lpf Final   Cast Type Hyaline casts N/A Final   Bacteria, UA None seen None seen/Few Final    Labs: CBC: Recent Labs  Lab 10/17/23 1557  WBC 7.1  NEUTROABS 5.3  HGB 15.3  HCT 45.5  MCV 84.7  PLT 162   Basic Metabolic Panel: Recent Labs  Lab 10/17/23 1557  NA 137  K 4.2  CL 102  CO2 26  GLUCOSE  104*  BUN 9  CREATININE 1.03  CALCIUM 9.7   Liver Function Tests: Recent Labs  Lab 10/17/23 1557  AST 17  ALT 12  ALKPHOS 57  BILITOT 0.9  PROT 8.3*  ALBUMIN 4.7   CBG: Recent Labs  Lab 10/17/23 2139 10/18/23 0737 10/18/23 1123  GLUCAP 118* 120* 130*    Discharge time spent: greater than 30 minutes.  Signed: Brenna Cam, MD [facility] 10/18/2023

## 2023-10-19 ENCOUNTER — Ambulatory Visit: Attending: Cardiology

## 2023-10-19 ENCOUNTER — Telehealth: Payer: Self-pay | Admitting: Cardiology

## 2023-10-19 ENCOUNTER — Telehealth: Payer: Self-pay

## 2023-10-19 ENCOUNTER — Other Ambulatory Visit: Payer: Self-pay

## 2023-10-19 DIAGNOSIS — I639 Cerebral infarction, unspecified: Secondary | ICD-10-CM

## 2023-10-19 NOTE — Telephone Encounter (Signed)
-----   Message from Nurse Concha Deed T sent at 10/19/2023  3:50 PM EDT ----- Regarding: RE: LOOP IMPLANT No problem, I have it ordered.   Thanks! ----- Message ----- From: Avni Traore, NP Sent: 10/19/2023   3:44 PM EDT To: Lorriane Rote, LPN; Wyn Heater, RN Subject: RE: LOOP IMPLANT                               I called  him to follow-up, and he is agreeable to a 2 week live Zio to eval for afib after CVA.  Could you help order this and mail to his home? I confirmed his home address. [place] to read.   Thank you, Starleen Trussell ----- Message ----- From: Chauvigne, Carlyle, RN Sent: 10/19/2023   1:51 PM EDT To: Lorriane Rote, LPN; Adaline Holly, NP Subject: RE: LOOP IMPLANT                               I called him to let him know the time change and he cancelled. He doesn't want a loop recorder. ----- Message ----- From: Emberleigh Reily, NP Sent: 10/18/2023   6:34 PM EDT To: Lorriane Rote, LPN; Wyn Heater, RN Subject: RE: LOOP IMPLANT                               Can you call him to let him know the new time? His DC summary from hospital has the old appt time. ----- Message ----- From: Chauvigne, Carlyle, RN Sent: 10/18/2023   5:00 PM EDT To: Lorriane Rote, LPN; Adaline Holly, NP Subject: RE: LOOP IMPLANT                               I moved him to 1140am so he wasn't in the middle of clinic and added note that he wants to use [employer].  Thanks!!! Luther Saltness ----- Message ----- From: Lorriane Rote, LPN Sent: 8/41/3244   4:23 PM EDT To: Carlyle Chauvigne, RN; Adaline Holly, NP; # Subject: LOOP IMPLANT                                   Please pre-cert for possible loop implant with Dr.Parker on 04/22.   Thank you!

## 2023-10-19 NOTE — Transitions of Care (Post Inpatient/ED Visit) (Signed)
 10/19/2023  Name: Christopher Arroyo MRN: [id number] DOB: Apr 30, 1946  Today's TOC FU Call Status: Today's TOC FU Call Status:: Successful TOC FU Call Completed TOC FU Call Complete Date: 10/19/23 Patient's Name and Date of Birth confirmed.  Transition Care Management Follow-up Telephone Call Date of Discharge: 10/18/23 Discharge Facility[facility]) Type of Discharge: Emergency Department Reason for ED Visit:  (cerebral infarction) How have you been since you were released from the hospital?: Better Any questions or concerns?: No  Items Reviewed: Did you receive and understand the discharge instructions provided?: Yes Medications obtained,verified, and reconciled?: Yes (Medications Reviewed) Any new allergies since your discharge?: No Dietary orders reviewed?: Yes Do you have support at home?: No  Medications Reviewed Today: Medications Reviewed Today     Reviewed by Karena Addison, LPN (Licensed Practical Nurse) on 10/19/23 at 1440  Med List Status: <None>   Medication Order Taking? Sig Documenting Provider Last Dose Status Informant  amLODipine (NORVASC) 5 MG tablet [id number] No TAKE 1 TABLET (5 MG TOTAL) BY MOUTH DAILY. Georgina Quint, MD 10/17/2023 Active Pharmacy Records, Other  aspirin EC 81 MG tablet [id number]  Take 1 tablet (81 mg total) by mouth daily. Swallow whole. Delfino Lovett, MD  Active   clopidogrel (PLAVIX) 75 MG tablet [id number]  Take 1 tablet (75 mg total) by mouth daily. Delfino Lovett, MD  Active   [facility] RELIEF 2 % GEL [id number] No APPLY 1 APPLICATION. TOPICALLY IN THE MORNING AND AT BEDTIME. Georgina Quint, MD Taking Active Pharmacy Records, Other           Med Note [facility]   Mon Oct 17, 2023  7:08 PM) prn  dorzolamide-timolol (COSOPT) 2-0.5 % ophthalmic solution [id number] No INSTILL 1 DROP INTO BOTH EYES TWICE A DAY Georgina Quint, MD 10/17/2023 Active Pharmacy Records, Other  levothyroxine  (SYNTHROID) 75 MCG tablet [id number] No TAKE 1 TABLET BY MOUTH DAILY BEFORE BREAKFAST. Georgina Quint, MD 10/17/2023 Active Pharmacy Records, Other  Multiple Vitamin (MULTIVITAMIN WITH MINERALS) TABS tablet [id number] No Take 1 tablet by mouth daily. [provider] 10/17/2023 Active Self, Pharmacy Records, Other  rosuvastatin (CRESTOR) 40 MG tablet [id number]  Take 1 tablet (40 mg total) by mouth daily. Delfino Lovett, MD  Active   traZODone (DESYREL) 50 MG tablet [id number] No Take 0.5-1 tablets (25-50 mg total) by mouth at bedtime as needed for sleep. Myrlene Broker, MD Taking Active Pharmacy Records, Other           Med Note [facility], [facility] A   Mon Oct 17, 2023  7:05 PM) prn  valsartan-hydrochlorothiazide (DIOVAN-HCT) 160-12.5 MG tablet [id number] No TAKE 1 TABLET BY MOUTH EVERY DAY Georgina Quint, MD 10/17/2023 Active Pharmacy Records, Other            Home Care and Equipment/Supplies: Were Home Health Services Ordered?: No Any new equipment or medical supplies ordered?: NA  Functional Questionnaire: Do you need assistance with bathing/showering or dressing?: No Do you need assistance with meal preparation?: No Do you need assistance with eating?: No Do you have difficulty maintaining continence: No Do you need assistance with getting out of bed/getting out of a chair/moving?: No Do you have difficulty managing or taking your medications?: No  Follow up appointments reviewed: PCP Follow-up appointment confirmed?: Yes Date of PCP follow-up appointment?: 10/26/23 [facility] Follow-up appointment confirmed?: NA Do you need transportation to your follow-up appointment?: No Do you understand care options if  your condition(s) worsen?: Yes-patient verbalized understanding    SIGNATURE Darrall Ellison, LPN [facility] Nurse Health Advisor Direct Dial [phone]

## 2023-10-19 NOTE — Telephone Encounter (Signed)
error 

## 2023-10-25 ENCOUNTER — Institutional Professional Consult (permissible substitution): Admitting: Cardiology

## 2023-10-26 ENCOUNTER — Inpatient Hospital Stay: Admitting: Emergency Medicine

## 2023-10-30 DIAGNOSIS — E039 Hypothyroidism, unspecified: Secondary | ICD-10-CM | POA: Diagnosis not present

## 2023-10-30 DIAGNOSIS — M1990 Unspecified osteoarthritis, unspecified site: Secondary | ICD-10-CM | POA: Diagnosis not present

## 2023-10-30 DIAGNOSIS — Z7982 Long term (current) use of aspirin: Secondary | ICD-10-CM | POA: Diagnosis not present

## 2023-10-30 DIAGNOSIS — E1142 Type 2 diabetes mellitus with diabetic polyneuropathy: Secondary | ICD-10-CM | POA: Diagnosis not present

## 2023-10-30 DIAGNOSIS — Z833 Family history of diabetes mellitus: Secondary | ICD-10-CM | POA: Diagnosis not present

## 2023-10-30 DIAGNOSIS — J439 Emphysema, unspecified: Secondary | ICD-10-CM | POA: Diagnosis not present

## 2023-10-30 DIAGNOSIS — F324 Major depressive disorder, single episode, in partial remission: Secondary | ICD-10-CM | POA: Diagnosis not present

## 2023-10-30 DIAGNOSIS — Z8249 Family history of ischemic heart disease and other diseases of the circulatory system: Secondary | ICD-10-CM | POA: Diagnosis not present

## 2023-10-30 DIAGNOSIS — E1136 Type 2 diabetes mellitus with diabetic cataract: Secondary | ICD-10-CM | POA: Diagnosis not present

## 2023-10-30 DIAGNOSIS — Z87891 Personal history of nicotine dependence: Secondary | ICD-10-CM | POA: Diagnosis not present

## 2023-10-30 DIAGNOSIS — F419 Anxiety disorder, unspecified: Secondary | ICD-10-CM | POA: Diagnosis not present

## 2023-10-30 DIAGNOSIS — E785 Hyperlipidemia, unspecified: Secondary | ICD-10-CM | POA: Diagnosis not present

## 2023-10-30 DIAGNOSIS — Z008 Encounter for other general examination: Secondary | ICD-10-CM | POA: Diagnosis not present

## 2023-11-01 ENCOUNTER — Ambulatory Visit (INDEPENDENT_AMBULATORY_CARE_PROVIDER_SITE_OTHER): Admitting: Emergency Medicine

## 2023-11-01 ENCOUNTER — Encounter: Payer: Self-pay | Admitting: Emergency Medicine

## 2023-11-01 VITALS — BP 142/98 | HR 72 | Temp 98.4°F | Ht 67.0 in | Wt 149.8 lb

## 2023-11-01 DIAGNOSIS — Z09 Encounter for follow-up examination after completed treatment for conditions other than malignant neoplasm: Secondary | ICD-10-CM

## 2023-11-01 DIAGNOSIS — I10 Essential (primary) hypertension: Secondary | ICD-10-CM | POA: Diagnosis not present

## 2023-11-01 DIAGNOSIS — E785 Hyperlipidemia, unspecified: Secondary | ICD-10-CM

## 2023-11-01 DIAGNOSIS — I639 Cerebral infarction, unspecified: Secondary | ICD-10-CM

## 2023-11-01 NOTE — Progress Notes (Signed)
 Christopher Arroyo 78 y.o.   Chief Complaint  Patient presents with   Hospitalization Follow-up    Continued fatigue, right hand is still weak and bothering him    HISTORY OF PRESENT ILLNESS: This is a 78 y.o. male here for hospital discharge follow-up Overall doing fine.  Has no complaints or medical concerns Wearing Zio patch today. Hospital discharge summary as follows: Physician Discharge Summary    Patient: Christopher Arroyo MRN: [id number] DOB: 11-Jul-1945  Admit date:     10/17/2023  Discharge date: 10/18/23  Discharge Physician: Brenna Cam    PCP: Elvira Hammersmith, MD    Recommendations at discharge:    Follow-up with PCP and neurology as requested   Discharge Diagnoses: Principal Problem:   Stroke [facility]) Active Problems:   Right internal carotid artery aneurysm   HTN (hypertension)   HLD (hyperlipidemia)   Myocardial injury   Hypothyroidism (acquired)   Depression with anxiety   Diabetes mellitus without complication [facility])   Hospital Course: Assessment and Plan:   78 y.o. male with hx of diabetes, hypertension, hypothyroidism, prior stroke with mild right-sided residual deficits presenting to the emergency department for evaluation of right hand incoordination and weakness    Stroke [facility]): MRI showed Small acute left MCA infarcts, predominantly affecting the perirolandic region, and extensive chronic ischemia with multiple chronic infarcts.  Evaluated by Dr. Bonnita Buttner of neurology   CT angio head and neck showed no emergent LVO. Neurology recommends aspirin  81 mg and Plavix  75 mg for now.  Increase rosuvastatin  to 40 mg to reach target LDL goal. 2D echo did not show any thrombus or major pathology Will need outpatient loop monitoring/recorder for evaluation of paroxysmal A-fib   Hx of right internal carotid artery aneurysm: s/p of  pipeline embolization - CTA of head and neck did not show any acute findings -on Plavix , aspirin , Crestor    HTN (hypertension):     HLD (hyperlipidemia) -Crestor    Myocardial injury: Troponin 21, no chest pain.  Likely demand ischemia. - On aspirin , Plavix  and Crestor    Hypothyroidism (acquired) -Synthroid    Depression with anxiety -Continue home as needed trazodone    Diabetes mellitus without complication (HCC): Recent A1c 6.1, well-controlled.              HPI   Prior to Admission medications   Medication Sig Start Date End Date Taking? Authorizing Provider  amLODipine  (NORVASC ) 5 MG tablet TAKE 1 TABLET (5 MG TOTAL) BY MOUTH DAILY. 01/14/23  Yes SagardiaIsidro Margo, MD  aspirin  EC 81 MG tablet Take 1 tablet (81 mg total) by mouth daily. Swallow whole. 10/19/23  Yes Brenna Cam, MD  clopidogrel  (PLAVIX ) 75 MG tablet Take 1 tablet (75 mg total) by mouth daily. 10/19/23 01/17/24 Yes Brenna Cam, MD  [facility] RELIEF 2 % GEL APPLY 1 APPLICATION. TOPICALLY IN THE MORNING AND AT BEDTIME. 10/19/22  Yes Gwendlyn Hanback, Isidro Margo, MD  dorzolamide -timolol  (COSOPT ) 2-0.5 % ophthalmic solution INSTILL 1 DROP INTO BOTH EYES TWICE A DAY 07/20/22  Yes Haru Shaff, Isidro Margo, MD  levothyroxine  (SYNTHROID ) 75 MCG tablet TAKE 1 TABLET BY MOUTH DAILY BEFORE BREAKFAST. 09/09/23  Yes Jammie Troup Jose, MD  Multiple Vitamin (MULTIVITAMIN WITH MINERALS) TABS tablet Take 1 tablet by mouth daily.   Yes [provider]  rosuvastatin  (CRESTOR ) 40 MG tablet Take 1 tablet (40 mg total) by mouth daily. 10/19/23 11/18/23 Yes Brenna Cam, MD  traZODone  (DESYREL ) 50 MG tablet Take 0.5-1 tablets (25-50 mg total) by mouth at bedtime as  needed for sleep. 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   Cluster headaches 10/15/2021   Allergic rhinitis 10/15/2021   Chronic bilateral low back pain with bilateral sciatica 05/13/2021   LVH (left ventricular hypertrophy) due to hypertensive disease, without heart failure 05/13/2021   Prediabetes 02/05/2021   Dyslipidemia 02/05/2021   Hepatitis C 11/24/2020   Hepatitis B core antibody positive 11/24/2020   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   Caregiver stress syndrome 08/17/2016   Anxiety 10/11/2011   Hypothyroidism (acquired) 10/11/2011   Insomnia 10/11/2011   Primary hypertension 10/11/2011    Past Medical History:  Diagnosis Date   Anxiety and depression    Current use of long term anticoagulation    DAPT (ASA + clopidogrel )   Diabetes mellitus without complication (HCC)    History of hiatal hernia    HLD (hyperlipidemia)    Hypertension    Hypothyroidism    PSA elevation    Right internal carotid artery aneurysm    s/p pipeline embolization on 08/08/2020   T2DM (type 2 diabetes mellitus) (HCC) 09/02/2020   TIA (transient ischemic attack) 2009   left hand weakness, decrease fine motor skills    Past Surgical History:  Procedure Laterality Date   CAROTID ANGIOGRAM Right 02/20/2021   CEREBRAL ANGIOGRAM N/A 04/24/2020   Location: [facility]   SP EMBOLIZATION INTRACRANIAL Right 08/08/2020   4.75 x 14 mm pipeline embolization device (PED) placement to RIGHT ICA cavernous segment aneurysm; Location: [facility]; Surgeon: Miranda America, MD   TONSILLECTOMY      Social History   Socioeconomic History   Marital status: Married    Spouse name: Not on file   Number of children: Not on file   Years of education: Not on file   Highest education level: Not on file  Occupational History   Not  on file  Tobacco Use   Smoking status: Former    Current packs/day: 0.00    Types: Cigarettes    Quit date: 41    Years since quitting: 41.3   Smokeless tobacco: Never  Vaping Use   Vaping status: Never Used  Substance and Sexual Activity   Alcohol use: Not Currently   Drug use: Not Currently    Types: Heroin, Marijuana    Comment: 1972 quit   Sexual activity: Not on file  Other Topics Concern   Not on file  Social History Narrative   Not on file   Social Drivers of Health   Financial Resource Strain: Not on file  Food Insecurity: Food Insecurity Present (10/17/2023)   Hunger Vital Sign    Worried About Running Out of Food in the Last Year: Sometimes true    Ran Out of Food in the Last Year: Sometimes true  Transportation Needs: No Transportation Needs (10/17/2023)   PRAPARE - Administrator, Civil Service (Medical): No    Lack of Transportation (Non-Medical): No  Physical Activity: Not on file  Stress: Not on file  Social Connections: Moderately Integrated (10/17/2023)   Social Connection and Isolation Panel [NHANES]    Frequency of Communication with Friends and Family: More than three times a week    Frequency of Social Gatherings with Friends and Family: More than three times a week    Attends Religious Services: More than 4 times per year    Active Member of [employer] or Organizations: No    Attends Banker Meetings: Never    Marital Status: Married  Catering manager Violence: Not At Risk (10/17/2023)   Humiliation, Afraid, Rape, and Kick questionnaire    Fear of Current or Ex-Partner: No    Emotionally Abused: No    Physically Abused: No    Sexually Abused: No    Family History  Problem Relation Age of Onset   Colon cancer Mother      Review of Systems  Constitutional: Negative.  Negative for chills and fever.  HENT: Negative.  Negative for congestion and sore throat.   Respiratory: Negative.  Negative for cough and shortness of  breath.   Cardiovascular: Negative.  Negative for chest pain and palpitations.  Gastrointestinal:  Negative for abdominal pain, diarrhea, nausea and vomiting.  Genitourinary: Negative.  Negative for dysuria and hematuria.  Skin: Negative.  Negative for rash.  Neurological: Negative.  Negative for dizziness and headaches.  All other systems reviewed and are negative.   Vitals:   11/01/23 1550  BP: (!) 142/98  Pulse: 72  Temp: 98.4 F (36.9 C)  SpO2: 97%    Physical Exam Vitals reviewed.  Constitutional:      Appearance: Normal appearance.  HENT:     Head: Normocephalic.  Eyes:     Extraocular Movements: Extraocular movements intact.     Pupils: Pupils are equal, round, and reactive to light.  Cardiovascular:     Rate and Rhythm: Normal rate and regular rhythm.     Pulses: Normal pulses.     Heart sounds: Normal heart sounds.  Pulmonary:     Effort: Pulmonary effort is normal.     Breath sounds: Normal breath sounds.  Abdominal:     Palpations: Abdomen is soft.     Tenderness: There is no abdominal tenderness.  Musculoskeletal:     Cervical back: No tenderness.  Lymphadenopathy:     Cervical: No cervical adenopathy.  Skin:    General: Skin is warm and dry.  Neurological:     General: No focal deficit present.     Mental Status: He is alert and oriented to person, place, and time.  Psychiatric:        Mood and Affect: Mood normal.        Behavior: Behavior normal.      ASSESSMENT & PLAN: A total of 46 minutes was spent with the patient and counseling/coordination of care regarding preparing for this visit, review of most recent office visit notes, review of multiple chronic medical conditions and their management, review of most recent hospital discharge summary, review of all medications, review of most recent bloodwork results, review of health maintenance items, education on nutrition, prognosis, documentation, and need for follow up.   Problem List Items  Addressed This Visit       Cardiovascular and Mediastinum   Stroke [facility]) - Primary   Clinical stable.  Still having some residual symptoms but minor. Secondary stroke prevention measures discussed Importance of hypertension control addressed Continue amlodipine  and valsartan  HCT Continue rosuvastatin  40 mg daily Continue daily baby  aspirin  and Plavix  75 mg daily Needs follow-up with neurologist as scheduled Not diabetic Diet and nutrition discussed Follow-up in 3 months.  Earlier as needed       HTN (hypertension)   Elevated blood pressure reading in the office Advised to monitor blood pressure readings at home daily over the next couple of weeks and keep a log.  Advised to contact the office if numbers persistently abnormal. Continue amlodipine  5 mg daily and valsartan  HCT 160-12.5 mg daily        Other   Dyslipidemia   Presently taking rosuvastatin  20 mg daily Diet and nutrition discussed      Other Visit Diagnoses       Hospital discharge follow-up          Patient Instructions  Stroke Prevention Some medical conditions and lifestyle choices can lead to a higher risk for a stroke. You can help to prevent a stroke by eating healthy foods and exercising. It also helps to not smoke and to manage any health problems you may have. How can this condition affect me? A stroke is an emergency. It should be treated right away. A stroke can lead to brain damage or threaten your life. There is a better chance of surviving and getting better after a stroke if you get medical help right away. What can increase my risk? The following medical conditions may increase your risk of a stroke: Diseases of the heart and blood vessels (cardiovascular disease). High blood pressure (hypertension). Diabetes. High cholesterol. Sickle cell disease. Problems with blood clotting. Being very overweight. Sleeping problems (obstructivesleep apnea). Other risk factors include: Being older than  age 7. A history of blood clots, stroke, or mini-stroke (TIA). Race, ethnic background, or a family history of stroke. Smoking or using tobacco products. Taking birth control pills, especially if you smoke. Heavy alcohol and drug use. Not being active. What actions can I take to prevent this? Manage your health conditions High cholesterol. Eat a healthy diet. If this is not enough to manage your cholesterol, you may need to take medicines. Take medicines as told by your doctor. High blood pressure. Try to keep your blood pressure below 130/80. If your blood pressure cannot be managed through a healthy diet and regular exercise, you may need to take medicines. Take medicines as told by your doctor. Ask your doctor if you should check your blood pressure at home. Have your blood pressure checked every year. Diabetes. Eat a healthy diet and get regular exercise. If your blood sugar (glucose) cannot be managed through diet and exercise, you may need to take medicines. Take medicines as told by your doctor. Talk to your doctor about getting checked for sleeping problems. Signs of a problem can include: Snoring a lot. Feeling very tired. Make sure that you manage any other conditions you have. Nutrition  Follow instructions from your doctor about what to eat or drink. You may be told to: Eat and drink fewer calories each day. Limit how much salt (sodium) you use to 1,500 milligrams (mg) each day. Use only healthy fats for cooking, such as olive oil, canola oil, and sunflower oil. Eat healthy foods. To do this: Choose foods that are high in fiber. These include whole grains, and fresh fruits and vegetables. Eat at least 5 servings of fruits and vegetables a day. Try to fill one-half of your plate with fruits and vegetables at each meal. Choose low-fat (lean) proteins. These include low-fat cuts of meat, chicken without skin, fish,  tofu, beans, and nuts. Eat low-fat dairy products. Avoid  foods that: Are high in salt. Have saturated fat. Have trans fat. Have cholesterol. Are processed or pre-made. Count how many carbohydrates you eat and drink each day. Lifestyle If you drink alcohol: Limit how much you have to: 0-1 drink a day for women who are not pregnant. 0-2 drinks a day for men. Know how much alcohol is in your drink. In the [place] one drink equals one 12 oz bottle of beer ( ), one 5 oz glass of wine ( ), or one 1 oz glass of hard liquor (44mL). Do not smoke or use any products that have nicotine or tobacco. If you need help quitting, ask your doctor. Avoid secondhand smoke. Do not use drugs. Activity  Try to stay at a healthy weight. Get at least 30 minutes of exercise on most days, such as: Fast walking. Biking. Swimming. Medicines Take over-the-counter and prescription medicines only as told by your doctor. Avoid taking birth control pills. Talk to your doctor about the risks of taking birth control pills if: You are over 34 years old. You smoke. You get very bad headaches. You have had a blood clot. Where to find more information American Stroke Association: www.strokeassociation.org Get help right away if: You or a loved one has any signs of a stroke. "BE FAST" is an easy way to remember the warning signs: B - Balance. Dizziness, sudden trouble walking, or loss of balance. E - Eyes. Trouble seeing or a change in how you see. F - Face. Sudden weakness or loss of feeling of the face. The face or eyelid may droop on one side. A - Arms. Weakness or loss of feeling in an arm. This happens all of a sudden and most often on one side of the body. S - Speech. Sudden trouble speaking, slurred speech, or trouble understanding what people say. T - Time. Time to call emergency services. Write down what time symptoms started. You or a loved one has other signs of a stroke, such as: A sudden, very bad headache with no known cause. Feeling like you may  vomit (nausea). Vomiting. A seizure. These symptoms may be an emergency. Get help right away. Call your local emergency services (911 in the [place] Do not wait to see if the symptoms will go away. Do not drive yourself to the hospital. Summary You can help to prevent a stroke by eating healthy, exercising, and not smoking. It also helps to manage any health problems you have. Do not smoke or use any products that contain nicotine or tobacco. Get help right away if you or a loved one has any signs of a stroke. This information is not intended to replace advice given to you by your health care provider. Make sure you discuss any questions you have with your health care provider. Document Revised: 05/24/2022 Document Reviewed: 05/24/2022 Elsevier Patient Education  2024 Elsevier Inc.    Maryagnes Small, MD [facility]

## 2023-11-01 NOTE — Patient Instructions (Signed)
 Stroke Prevention Some medical conditions and lifestyle choices can lead to a higher risk for a stroke. You can help to prevent a stroke by eating healthy foods and exercising. It also helps to not smoke and to manage any health problems you may have. How can this condition affect me? A stroke is an emergency. It should be treated right away. A stroke can lead to brain damage or threaten your life. There is a better chance of surviving and getting better after a stroke if you get medical help right away. What can increase my risk? The following medical conditions may increase your risk of a stroke: Diseases of the heart and blood vessels (cardiovascular disease). High blood pressure (hypertension). Diabetes. High cholesterol. Sickle cell disease. Problems with blood clotting. Being very overweight. Sleeping problems (obstructivesleep apnea). Other risk factors include: Being older than age 70. A history of blood clots, stroke, or mini-stroke (TIA). Race, ethnic background, or a family history of stroke. Smoking or using tobacco products. Taking birth control pills, especially if you smoke. Heavy alcohol and drug use. Not being active. What actions can I take to prevent this? Manage your health conditions High cholesterol. Eat a healthy diet. If this is not enough to manage your cholesterol, you may need to take medicines. Take medicines as told by your doctor. High blood pressure. Try to keep your blood pressure below 130/80. If your blood pressure cannot be managed through a healthy diet and regular exercise, you may need to take medicines. Take medicines as told by your doctor. Ask your doctor if you should check your blood pressure at home. Have your blood pressure checked every year. Diabetes. Eat a healthy diet and get regular exercise. If your blood sugar (glucose) cannot be managed through diet and exercise, you may need to take medicines. Take medicines as told by your  doctor. Talk to your doctor about getting checked for sleeping problems. Signs of a problem can include: Snoring a lot. Feeling very tired. Make sure that you manage any other conditions you have. Nutrition  Follow instructions from your doctor about what to eat or drink. You may be told to: Eat and drink fewer calories each day. Limit how much salt (sodium) you use to 1,500 milligrams (mg) each day. Use only healthy fats for cooking, such as olive oil, canola oil, and sunflower oil. Eat healthy foods. To do this: Choose foods that are high in fiber. These include whole grains, and fresh fruits and vegetables. Eat at least 5 servings of fruits and vegetables a day. Try to fill one-half of your plate with fruits and vegetables at each meal. Choose low-fat (lean) proteins. These include low-fat cuts of meat, chicken without skin, fish, tofu, beans, and nuts. Eat low-fat dairy products. Avoid foods that: Are high in salt. Have saturated fat. Have trans fat. Have cholesterol. Are processed or pre-made. Count how many carbohydrates you eat and drink each day. Lifestyle If you drink alcohol: Limit how much you have to: 0-1 drink a day for women who are not pregnant. 0-2 drinks a day for men. Know how much alcohol is in your drink. In the [place] one drink equals one 12 oz bottle of beer ( ), one 5 oz glass of wine ( ), or one 1 oz glass of hard liquor (44mL). Do not smoke or use any products that have nicotine or tobacco. If you need help quitting, ask your doctor. Avoid secondhand smoke. Do not use drugs. Activity  Try to stay at a  healthy weight. Get at least 30 minutes of exercise on most days, such as: Fast walking. Biking. Swimming. Medicines Take over-the-counter and prescription medicines only as told by your doctor. Avoid taking birth control pills. Talk to your doctor about the risks of taking birth control pills if: You are over 16 years old. You smoke. You get  very bad headaches. You have had a blood clot. Where to find more information American Stroke Association: www.strokeassociation.org Get help right away if: You or a loved one has any signs of a stroke. "BE FAST" is an easy way to remember the warning signs: B - Balance. Dizziness, sudden trouble walking, or loss of balance. E - Eyes. Trouble seeing or a change in how you see. F - Face. Sudden weakness or loss of feeling of the face. The face or eyelid may droop on one side. A - Arms. Weakness or loss of feeling in an arm. This happens all of a sudden and most often on one side of the body. S - Speech. Sudden trouble speaking, slurred speech, or trouble understanding what people say. T - Time. Time to call emergency services. Write down what time symptoms started. You or a loved one has other signs of a stroke, such as: A sudden, very bad headache with no known cause. Feeling like you may vomit (nausea). Vomiting. A seizure. These symptoms may be an emergency. Get help right away. Call your local emergency services (911 in the [place] Do not wait to see if the symptoms will go away. Do not drive yourself to the hospital. Summary You can help to prevent a stroke by eating healthy, exercising, and not smoking. It also helps to manage any health problems you have. Do not smoke or use any products that contain nicotine or tobacco. Get help right away if you or a loved one has any signs of a stroke. This information is not intended to replace advice given to you by your health care provider. Make sure you discuss any questions you have with your health care provider. Document Revised: 05/24/2022 Document Reviewed: 05/24/2022 Elsevier Patient Education  2024 [employer].

## 2023-11-01 NOTE — Assessment & Plan Note (Signed)
 Presently taking rosuvastatin  20 mg daily Diet and nutrition discussed

## 2023-11-01 NOTE — Assessment & Plan Note (Addendum)
 Elevated blood pressure reading in the office Advised to monitor blood pressure readings at home daily over the next couple of weeks and keep a log.  Advised to contact the office if numbers persistently abnormal. Continue amlodipine  5 mg daily and valsartan  HCT 160-12.5 mg daily

## 2023-11-01 NOTE — Assessment & Plan Note (Addendum)
 Clinical stable.  Still having some residual symptoms but minor. Secondary stroke prevention measures discussed Importance of hypertension control addressed Continue amlodipine  and valsartan  HCT Continue rosuvastatin  40 mg daily Continue daily baby aspirin  and Plavix  75 mg daily Needs follow-up with neurologist as scheduled Not diabetic Diet and nutrition discussed Follow-up in 3 months.  Earlier as needed

## 2023-11-03 ENCOUNTER — Encounter

## 2023-11-04 ENCOUNTER — Encounter: Payer: Self-pay | Admitting: Family Medicine

## 2023-11-04 ENCOUNTER — Ambulatory Visit (INDEPENDENT_AMBULATORY_CARE_PROVIDER_SITE_OTHER): Admitting: Family Medicine

## 2023-11-04 ENCOUNTER — Ambulatory Visit: Payer: Self-pay

## 2023-11-04 ENCOUNTER — Telehealth: Payer: Self-pay | Admitting: Emergency Medicine

## 2023-11-04 ENCOUNTER — Other Ambulatory Visit: Payer: Self-pay | Admitting: Radiology

## 2023-11-04 VITALS — BP 150/87 | HR 70 | Temp 97.9°F | Resp 20 | Ht 67.0 in | Wt 146.4 lb

## 2023-11-04 DIAGNOSIS — F39 Unspecified mood [affective] disorder: Secondary | ICD-10-CM | POA: Diagnosis not present

## 2023-11-04 DIAGNOSIS — F4389 Other reactions to severe stress: Secondary | ICD-10-CM | POA: Diagnosis not present

## 2023-11-04 DIAGNOSIS — G459 Transient cerebral ischemic attack, unspecified: Secondary | ICD-10-CM

## 2023-11-04 DIAGNOSIS — I639 Cerebral infarction, unspecified: Secondary | ICD-10-CM

## 2023-11-04 NOTE — Telephone Encounter (Signed)
 Copied from CRM [id number]. Topic: Clinical - Medical Advice >> Nov 04, 2023 12:30 PM Allyne Areola wrote: Reason for CRM: Patient was seen on Tuesday 11/01/2023 and was going to be referred for physical and occupational therapy. He has not heard about scheduling this and wanted to follow up with Dr.Sagardia.

## 2023-11-04 NOTE — Telephone Encounter (Signed)
LVM for patient to call back regarding referral

## 2023-11-04 NOTE — Telephone Encounter (Signed)
Scheduled with Dr. Volanda Napoleon

## 2023-11-04 NOTE — Progress Notes (Signed)
 "  SUBJECTIVE:   Chief Complaint  Patient presents with   Mental Health Problem    Feeling sad, has though of self-harm, low energy.    HPI Presents for acute visit  Discussed the use of AI scribe software for clinical note transcription with the patient, who gave verbal consent to proceed.  History of Present Illness Christopher Arroyo is a 78 year old male who presents with depressive symptoms and recent minor stroke.  He has been experiencing depressive symptoms, including anhedonia, disinterest in previously enjoyed activities, and feelings of being overwhelmed. He has thoughts of self-harm, particularly since his wife's health deteriorated, but denies any plans or previous attempts. His faith as a [employer] and support from close friends help him refrain from self-harm.  Approximately one week ago, he noticed difficulty tying his tie with his right hand, which led to a consultation with his primary care physician. Subsequent evaluation at the emergency room confirmed a minor stroke through CT and MRI imaging. He was discharged with new medications and advised to perform exercises for his hand, along with physical and occupational therapy.  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 He manages all household responsibilities since his wife's health declined significantly after moving to a new house in [place]. His wife is now in a nursing home due to her inability to walk and cognitive decline, which has left him feeling overwhelmed and responsible for all household duties.  He seeks support in managing his current situation, preferring to avoid medication if possible, and desires someone to talk to in order to 'sort through all of this madness'. He has not yet discussed these issues with his primary care doctor but is in the process of transferring his care to a provider closer to his home.    PERTINENT PMH / PSH: As above  OBJECTIVE:  BP (!)  150/87   Pulse 70   Temp 97.9 F (36.6 C)   Resp 20   Ht 5' 7 (1.702 m)   Wt 146 lb 6 oz (66.4 kg)   SpO2 98%   BMI 22.93 kg/m    Physical Exam Vitals reviewed.  Constitutional:      General: He is not in acute distress.    Appearance: Normal appearance. He is normal weight. He is not ill-appearing, toxic-appearing or diaphoretic.  Eyes:     General:        Right eye: No discharge.        Left eye: No discharge.  Cardiovascular:     Rate and Rhythm: Normal rate and regular rhythm.     Heart sounds: Normal heart sounds.  Pulmonary:     Effort: Pulmonary effort is normal.     Breath sounds: Normal breath sounds.  Abdominal:     General: Bowel sounds are normal.  Musculoskeletal:        General: Normal range of motion.     Cervical back: Normal range of motion.  Skin:    General: Skin is warm and dry.  Neurological:     Mental Status: He is alert and oriented to person, place, and time. Mental status is at baseline.  Psychiatric:        Attention and Perception: Attention normal.        Mood and Affect: Mood is depressed. Affect is flat.        Speech: Speech normal.        Behavior: Behavior normal. Behavior is  cooperative.        Thought Content: Thought content normal.        Judgment: Judgment normal.           11/04/2023    2:45 PM 10/17/2023    2:12 PM 08/22/2023    2:26 PM 03/17/2023    1:50 PM 10/25/2022    1:33 PM  Depression screen PHQ 2/9  Decreased Interest 3 2 1  0 0  Down, Depressed, Hopeless 2 2 2  0 0  PHQ - 2 Score 5 4 3  0 0  Altered sleeping 2 2 3   0  Tired, decreased energy 1 2 2   0  Change in appetite 0 2 3  0  Feeling bad or failure about yourself  3 2 2   0  Trouble concentrating 0 2 3  0  Moving slowly or fidgety/restless 0 2 0  0  Suicidal thoughts 3 0 2  0  PHQ-9 Score 14 16 18   0  Difficult doing work/chores Very difficult Somewhat difficult Very difficult        11/04/2023    2:45 PM 10/17/2023    2:12 PM  GAD 7 : Generalized  Anxiety Score  Nervous, Anxious, on Edge 3 2  Control/stop worrying 3 2  Worry too much - different things 3 2  Trouble relaxing 1 2  Restless 3 2  Easily annoyed or irritable 3 2  Afraid - awful might happen 0 2  Total GAD 7 Score 16 14  Anxiety Difficulty Very difficult Somewhat difficult    ASSESSMENT/PLAN:  Mood disorder (HCC) Assessment & Plan: Significant depressive symptoms related to caregiving stress. No current medication interest. Seeking counseling support.  No active plan SI, access to firearms and no intention for self harm.   - Provided 24hr crisis line information. - Urgent referral to therapist for counseling. - Discussed medication options if interested. Provided information. - Encouraged follow-up with primary care next week. Appointment scheduled - Provided information on 24-hour behavioral health center in [place].    Orders: -     Ambulatory referral to Psychology  Caregiver stress syndrome -     Ambulatory referral to Psychology    Total of 35 minutes spent with patient, greater than 50% of time spent face to face on counseling and coordination of care, specifically mental concerns.      PDMP reviewed  Return in about 4 days (around 11/08/2023) for PCP.  Glenys Ferrari, MD "

## 2023-11-04 NOTE — Assessment & Plan Note (Addendum)
 Significant depressive symptoms related to caregiving stress. No current medication interest. Seeking counseling support.  No active plan SI, access to firearms and no intention for self harm.   - Provided 24hr crisis line information. - Urgent referral to therapist for counseling. - Discussed medication options if interested. Provided information. - Encouraged follow-up with primary care next week. Appointment scheduled - Provided information on 24-hour behavioral health center in [place].

## 2023-11-04 NOTE — Telephone Encounter (Addendum)
 Copied from CRM [id number]. Topic: Clinical - Red Word Triage >> Nov 04, 2023  9:17 AM Bambi Bonine D wrote: Red Word that prompted transfer to Nurse Triage: Depression, Suicidal thoughts  Patient stated that he thinks he has manic depression and also have been having suicidal thoughts.   Chief Complaint: Depression, prior thoughts of suicide, does not have a plan. Several life changes have occurred. Symptoms: Above Frequency: several weeks Pertinent Negatives: Patient denies self harm today Disposition: [] ED /[] Urgent Care (no appt availability in office) / [x] Appointment(In office/virtual)/ []  [facility]/ [] Home Care/ [] Refused Recommended Disposition /[] [facility]/ []  Follow-up with PCP Additional Notes: Appointment made per [facility] in [place].  Reason for Disposition  Sometimes has thoughts of suicide  Answer Assessment - Initial Assessment Questions 1. CONCERN: "What happened that made you call today?"     Depressed 2. DEPRESSION SYMPTOM SCREENING: "How are you feeling overall?" (e.g., decreased energy, increased sleeping or difficulty sleeping, difficulty concentrating, feelings of sadness, guilt, hopelessness, or worthlessness)     Sad 3. RISK OF HARM - SUICIDAL IDEATION:  "Do you ever have thoughts of hurting or killing yourself?"  (e.g., yes, no, no but preoccupation with thoughts about death)   - INTENT:  "Do you have thoughts of hurting or killing yourself right NOW?" (e.g., yes, no, N/A)   - PLAN: "Do you have a specific plan for how you would do this?" (e.g., gun, knife, overdose, no plan, N/A)     Yes, no plan 4. RISK OF HARM - HOMICIDAL IDEATION:  "Do you ever have thoughts of hurting or killing someone else?"  (e.g., yes, no, no but preoccupation with thoughts about death)   - INTENT:  "Do you have thoughts of hurting or killing someone right NOW?" (e.g., yes, no, N/A)   - PLAN: "Do you have a specific plan for how you would do this?" (e.g., gun,  knife, no plan, N/A)      no 5. FUNCTIONAL IMPAIRMENT: "How have things been going for you overall? Have you had more difficulty than usual doing your normal daily activities?"  (e.g., better, same, worse; self-care, school, work, interactions)     overwhelmed 6. SUPPORT: "Who is with you now?" "Who do you live with?" "Do you have family or friends who you can talk to?"      Friends 7. THERAPIST: "Do you have a counselor or therapist? Name?"     no 8. STRESSORS: "Has there been any new stress or recent changes in your life?"     Yes 9. ALCOHOL USE OR SUBSTANCE USE (DRUG USE): "Do you drink alcohol or use any illegal drugs?"     no 10. OTHER: "Do you have any other physical symptoms right now?" (e.g., fever)       no 11. PREGNANCY: "Is there any chance you are pregnant?" "When was your last menstrual period?"       N/a  Protocols used: Depression-A-AH

## 2023-11-04 NOTE — Patient Instructions (Addendum)
 It was a pleasure meeting you today. Thank you for allowing me to take part in your health care.  Our goals for today as we discussed include:  Referral sent to [facility] therapy  For Mental Health Concerns  [facility] [phone] Address: [address] [place], [place] [address] Hours: Open 24/7, No appointment required.    24 hour crisis line [phone]     This is a list of the screening recommended for you and due dates:  Health Maintenance  Topic Date Due   Eye exam for diabetics  Never done   Yearly kidney health urinalysis for diabetes  Never done   Zoster (Shingles) Vaccine (2 of 2) 10/28/2020   Complete foot exam   02/05/2022   COVID-19 Vaccine (6 - 2024-25 season) 03/06/2023   Flu Shot  02/03/2024   Hemoglobin A1C  04/17/2024   Medicare Annual Wellness Visit  09/11/2024   Yearly kidney function blood test for diabetes  10/16/2024   Colon Cancer Screening  01/07/2025   Pneumonia Vaccine  Completed   Hepatitis C Screening  Completed   HPV Vaccine  Aged Out   Meningitis B Vaccine  Aged Out   DTaP/Tdap/Td vaccine  Discontinued      If you have any questions or concerns, please do not hesitate to call the office at [phone].  I look forward to our next visit and until then take care and stay safe.  Regards,   Valli Gaw, MD   [facility]

## 2023-11-08 ENCOUNTER — Ambulatory Visit: Admitting: Emergency Medicine

## 2023-11-14 ENCOUNTER — Telehealth: Payer: Self-pay | Admitting: Cardiology

## 2023-11-14 NOTE — Telephone Encounter (Signed)
 Patient is requesting to know how he should send the Zio Patch Heart Monitor back. Please advise.

## 2023-11-14 NOTE — Telephone Encounter (Signed)
 Pt calling to inform the nurse that he figured out how to send the Zio monitor back.

## 2023-11-24 ENCOUNTER — Ambulatory Visit: Admitting: Occupational Therapy

## 2023-11-24 ENCOUNTER — Ambulatory Visit

## 2023-11-30 ENCOUNTER — Encounter

## 2023-12-12 DIAGNOSIS — H401122 Primary open-angle glaucoma, left eye, moderate stage: Secondary | ICD-10-CM | POA: Diagnosis not present

## 2023-12-16 ENCOUNTER — Other Ambulatory Visit: Payer: Self-pay | Admitting: Emergency Medicine

## 2023-12-16 DIAGNOSIS — I119 Hypertensive heart disease without heart failure: Secondary | ICD-10-CM

## 2023-12-16 DIAGNOSIS — E785 Hyperlipidemia, unspecified: Secondary | ICD-10-CM

## 2023-12-16 DIAGNOSIS — I10 Essential (primary) hypertension: Secondary | ICD-10-CM

## 2023-12-21 DIAGNOSIS — H401111 Primary open-angle glaucoma, right eye, mild stage: Secondary | ICD-10-CM | POA: Diagnosis not present

## 2023-12-21 DIAGNOSIS — H401122 Primary open-angle glaucoma, left eye, moderate stage: Secondary | ICD-10-CM | POA: Diagnosis not present

## 2023-12-21 DIAGNOSIS — G453 Amaurosis fugax: Secondary | ICD-10-CM | POA: Diagnosis not present

## 2023-12-21 DIAGNOSIS — H47391 Other disorders of optic disc, right eye: Secondary | ICD-10-CM | POA: Diagnosis not present

## 2024-01-19 ENCOUNTER — Other Ambulatory Visit: Payer: Self-pay | Admitting: Emergency Medicine

## 2024-01-19 DIAGNOSIS — E039 Hypothyroidism, unspecified: Secondary | ICD-10-CM

## 2024-03-06 DIAGNOSIS — H401111 Primary open-angle glaucoma, right eye, mild stage: Secondary | ICD-10-CM | POA: Diagnosis not present

## 2024-03-23 DIAGNOSIS — H401122 Primary open-angle glaucoma, left eye, moderate stage: Secondary | ICD-10-CM | POA: Diagnosis not present

## 2024-04-06 DIAGNOSIS — H401122 Primary open-angle glaucoma, left eye, moderate stage: Secondary | ICD-10-CM | POA: Diagnosis not present

## 2024-04-06 DIAGNOSIS — H04123 Dry eye syndrome of bilateral lacrimal glands: Secondary | ICD-10-CM | POA: Diagnosis not present

## 2024-04-06 DIAGNOSIS — H401111 Primary open-angle glaucoma, right eye, mild stage: Secondary | ICD-10-CM | POA: Diagnosis not present

## 2024-04-06 DIAGNOSIS — Z9889 Other specified postprocedural states: Secondary | ICD-10-CM | POA: Diagnosis not present

## 2024-04-23 DIAGNOSIS — M9903 Segmental and somatic dysfunction of lumbar region: Secondary | ICD-10-CM | POA: Diagnosis not present

## 2024-04-23 DIAGNOSIS — M9905 Segmental and somatic dysfunction of pelvic region: Secondary | ICD-10-CM | POA: Diagnosis not present

## 2024-04-23 DIAGNOSIS — M5136 Other intervertebral disc degeneration, lumbar region with discogenic back pain only: Secondary | ICD-10-CM | POA: Diagnosis not present

## 2024-04-23 DIAGNOSIS — M545 Low back pain, unspecified: Secondary | ICD-10-CM | POA: Diagnosis not present

## 2024-04-24 DIAGNOSIS — M5136 Other intervertebral disc degeneration, lumbar region with discogenic back pain only: Secondary | ICD-10-CM | POA: Diagnosis not present

## 2024-04-24 DIAGNOSIS — M9903 Segmental and somatic dysfunction of lumbar region: Secondary | ICD-10-CM | POA: Diagnosis not present

## 2024-04-24 DIAGNOSIS — M545 Low back pain, unspecified: Secondary | ICD-10-CM | POA: Diagnosis not present

## 2024-04-24 DIAGNOSIS — M9905 Segmental and somatic dysfunction of pelvic region: Secondary | ICD-10-CM | POA: Diagnosis not present

## 2024-04-25 DIAGNOSIS — M545 Low back pain, unspecified: Secondary | ICD-10-CM | POA: Diagnosis not present

## 2024-04-25 DIAGNOSIS — M9905 Segmental and somatic dysfunction of pelvic region: Secondary | ICD-10-CM | POA: Diagnosis not present

## 2024-04-25 DIAGNOSIS — M9903 Segmental and somatic dysfunction of lumbar region: Secondary | ICD-10-CM | POA: Diagnosis not present

## 2024-04-25 DIAGNOSIS — M5136 Other intervertebral disc degeneration, lumbar region with discogenic back pain only: Secondary | ICD-10-CM | POA: Diagnosis not present

## 2024-05-04 DIAGNOSIS — M9905 Segmental and somatic dysfunction of pelvic region: Secondary | ICD-10-CM | POA: Diagnosis not present

## 2024-05-04 DIAGNOSIS — M9903 Segmental and somatic dysfunction of lumbar region: Secondary | ICD-10-CM | POA: Diagnosis not present

## 2024-05-04 DIAGNOSIS — M545 Low back pain, unspecified: Secondary | ICD-10-CM | POA: Diagnosis not present

## 2024-05-04 DIAGNOSIS — M5136 Other intervertebral disc degeneration, lumbar region with discogenic back pain only: Secondary | ICD-10-CM | POA: Diagnosis not present

## 2024-06-05 ENCOUNTER — Ambulatory Visit: Payer: Self-pay

## 2024-06-05 ENCOUNTER — Ambulatory Visit: Admitting: Emergency Medicine

## 2024-06-05 ENCOUNTER — Encounter: Payer: Self-pay | Admitting: Emergency Medicine

## 2024-06-05 VITALS — BP 130/70 | HR 65 | Temp 97.4°F | Ht 67.0 in | Wt 147.0 lb

## 2024-06-05 DIAGNOSIS — E039 Hypothyroidism, unspecified: Secondary | ICD-10-CM

## 2024-06-05 DIAGNOSIS — B351 Tinea unguium: Secondary | ICD-10-CM

## 2024-06-05 DIAGNOSIS — M5441 Lumbago with sciatica, right side: Secondary | ICD-10-CM | POA: Diagnosis not present

## 2024-06-05 DIAGNOSIS — G8929 Other chronic pain: Secondary | ICD-10-CM | POA: Diagnosis not present

## 2024-06-05 DIAGNOSIS — E785 Hyperlipidemia, unspecified: Secondary | ICD-10-CM | POA: Diagnosis not present

## 2024-06-05 DIAGNOSIS — R2681 Unsteadiness on feet: Secondary | ICD-10-CM

## 2024-06-05 DIAGNOSIS — R7303 Prediabetes: Secondary | ICD-10-CM | POA: Diagnosis not present

## 2024-06-05 DIAGNOSIS — I10 Essential (primary) hypertension: Secondary | ICD-10-CM

## 2024-06-05 DIAGNOSIS — R2689 Other abnormalities of gait and mobility: Secondary | ICD-10-CM | POA: Diagnosis not present

## 2024-06-05 DIAGNOSIS — M5442 Lumbago with sciatica, left side: Secondary | ICD-10-CM

## 2024-06-05 NOTE — Assessment & Plan Note (Signed)
 Recommend evaluation by physical therapy Referral placed today

## 2024-06-05 NOTE — Telephone Encounter (Signed)
 FYI Only or Action Required?: this afternoon. Pt was calling to cancel appt this afternoon. Pt will now come into office after triage.  Patient was last seen in primary care on 11/04/2023 by Hope Merle, MD.  Called Nurse Triage reporting Toe Pain and Neurologic Problem.  Symptoms began toe has been ongoing for a long time. Pt has tried otc counter medications without success. Pt refused medication from podiatrist d/t possible liver damage. Pt then mentioned that he needs OT d/t increased neurological issues. In the past 1 month, Pt is knocking over glasses on the table, is having issues tying his shoes and is having issues buckling his seat belt. Pt has hx of stroke. .  Interventions attempted:  for toe has tried otc medications.  Symptoms are: unchanged.  Triage Disposition: See PCP When Office is Open (Within 3 Days)  Patient/caregiver understands and will follow disposition?: yes                           Copied from CRM #[id number]. Topic: Clinical - Red Word Triage >> Jun 05, 2024  8:02 AM Mesmerise C wrote: [facility] that prompted transfer to Nurse Triage: Patient's right big toe is sore and is toenail is turning black, has taking otc meds no improvement states it's painful to walk has an appt for 12/2 needs to reschedule Reason for Disposition  [1] MODERATE pain (e.g., interferes with normal activities, limping) AND [2] present > 3 days  [1] Weakness of arm / hand, or leg / foot AND [2] is a chronic symptom (recurrent or ongoing AND present > 4 weeks)  Answer Assessment - Initial Assessment Questions 1. ONSET: When did the pain start?      *No Answer* 2. LOCATION: Where is the pain located?   (e.g., around nail, entire toe, at foot joint)      Right big toe 3. PAIN: How bad is the pain?    (Scale 1-10; or mild, moderate, severe)     *No Answer* 4. APPEARANCE: What does the toe look like? (e.g., redness, swelling, bruising, pallor)     dark 5. CAUSE:  What do you think is causing the toe pain?     unsure 6. OTHER SYMPTOMS: Do you have any other symptoms? (e.g., leg pain, rash, fever, numbness)     *No Answer*  Answer Assessment - Initial Assessment Questions 1. SYMPTOM: What is the main symptom you are concerned about? (e.g., weakness, numbness)     Knocking things over 2. ONSET: When did this start? (e.g., minutes, hours, days; while sleeping)     1 month ago 3. LAST NORMAL: When was the last time you (the patient) were normal (no symptoms)?     1 month ago 4. PATTERN Does this come and go, or has it been constant since it started?  Is it present now?     constant 5. CARDIAC SYMPTOMS: Have you had any of the following symptoms: chest pain, difficulty breathing, palpitations?     no 6. NEUROLOGIC SYMPTOMS: Have you had any of the following symptoms: headache, dizziness, vision loss, double vision, changes in speech, unsteady on your feet?    Nothing new  Protocols used: Toe Pain-A-AH, Neurologic Deficit-A-AH

## 2024-06-05 NOTE — Assessment & Plan Note (Signed)
 BP Readings from Last 3 Encounters:  06/05/24 130/70  11/04/23 (!) 150/87  11/01/23 (!) 142/98  Well-controlled hypertension Continue amlodipine  5 mg daily and valsartan  HCT 160-12.5 mg daily Cardiovascular risks associated with uncontrolled hypertension discussed Diet and nutrition discussed

## 2024-06-05 NOTE — Patient Instructions (Signed)

## 2024-06-05 NOTE — Assessment & Plan Note (Signed)
 Presently taking rosuvastatin  20 mg daily Diet and nutrition discussed

## 2024-06-05 NOTE — Assessment & Plan Note (Signed)
Clinically euthyroid.  Continue Synthroid 75 mcg daily. 

## 2024-06-05 NOTE — Assessment & Plan Note (Signed)
Advanced.  Recommend podiatry evaluation. Referral placed today.

## 2024-06-05 NOTE — Assessment & Plan Note (Signed)
 Lab Results  Component Value Date   HGBA1C 5.7 (H) 10/17/2023  Diet and nutrition discussed Stable.  No signs of diabetes

## 2024-06-05 NOTE — Progress Notes (Signed)
 Christopher Arroyo 78 y.o.   Chief Complaint  Patient presents with   Follow-up    Patient would like to have  ot and pt for recent stroke and fungus on right big toe but it has not gotten better    HISTORY OF PRESENT ILLNESS: This is a 78 y.o. male here for follow-up of chronic medical conditions Still struggling with toenail fungus on right foot Also requesting referral to physical therapy for gait problems.  No recent falls. No other complaints or medical concerns today.  HPI   Prior to Admission medications   Medication Sig Start Date End Date Taking? Authorizing Provider  amLODipine  (NORVASC ) 5 MG tablet TAKE 1 TABLET (5 MG TOTAL) BY MOUTH DAILY. 12/16/23  Yes SagardiaEmil Schanz, MD  aspirin  EC 81 MG tablet Take 1 tablet (81 mg total) by mouth daily. Swallow whole. 10/19/23  Yes Maree Hue, MD  [facility] RELIEF 2 % GEL APPLY 1 APPLICATION. TOPICALLY IN THE MORNING AND AT BEDTIME. 10/19/22  Yes Josey Forcier, Emil Schanz, MD  dorzolamide -timolol  (COSOPT ) 2-0.5 % ophthalmic solution INSTILL 1 DROP INTO BOTH EYES TWICE A DAY 07/20/22  Yes Jerriyah Louis, Emil Schanz, MD  levothyroxine  (SYNTHROID ) 75 MCG tablet TAKE 1 TABLET BY MOUTH DAILY BEFORE BREAKFAST. 01/19/24  Yes Trinika Cortese, Emil Schanz, MD  Multiple Vitamin (MULTIVITAMIN WITH MINERALS) TABS tablet Take 1 tablet by mouth daily.   Yes [provider]  rosuvastatin  (CRESTOR ) 20 MG tablet TAKE 1 TABLET BY MOUTH EVERY DAY 12/16/23  Yes Abeni Finchum, Emil Schanz, MD  rosuvastatin  (CRESTOR ) 40 MG tablet Take 1 tablet (40 mg total) by mouth daily. 10/19/23 06/05/24 Yes Maree Hue, MD  traZODone  (DESYREL ) 50 MG tablet Take 0.5-1 tablets (25-50 mg total) by mouth at bedtime as needed for sleep. 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   Cluster headaches 10/15/2021   Allergic rhinitis 10/15/2021   Chronic bilateral low back pain with bilateral sciatica 05/13/2021   LVH (left ventricular hypertrophy) due to hypertensive disease, without heart failure 05/13/2021   Prediabetes 02/05/2021   Dyslipidemia 02/05/2021   Hepatitis C 11/24/2020   Hepatitis B core antibody positive 11/24/2020   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   Caregiver stress syndrome 08/17/2016   Anxiety 10/11/2011   Hypothyroidism (acquired) 10/11/2011   Insomnia 10/11/2011   Primary hypertension 10/11/2011    Past Medical History:  Diagnosis Date   Anxiety and depression    Current use of long term anticoagulation    DAPT (ASA + clopidogrel )   Diabetes mellitus without complication (HCC)    History of hiatal hernia    HLD (hyperlipidemia)    Hypertension    Hypothyroidism    PSA elevation    Right internal carotid artery aneurysm    s/p pipeline embolization on 08/08/2020   T2DM (type 2 diabetes mellitus) (HCC) 09/02/2020   TIA (transient ischemic attack) 2009   left hand weakness, decrease fine motor skills    Past Surgical History:  Procedure Laterality Date   CAROTID ANGIOGRAM Right 02/20/2021   CEREBRAL ANGIOGRAM N/A 04/24/2020   Location: [facility]   SP EMBOLIZATION INTRACRANIAL Right 08/08/2020   4.75 x 14 mm pipeline embolization device (PED) placement to RIGHT ICA cavernous segment aneurysm; Location: [facility]; Surgeon: Dallas Busman, MD   TONSILLECTOMY      Social History   Socioeconomic History   Marital status: Married    Spouse name: Not on file   Number of children: Not on file    Years of education: Not on file   Highest education level: Not on file  Occupational History   Not on file  Tobacco Use   Smoking status: Former    Current packs/day: 0.00    Types: Cigarettes    Quit date: 1    Years since quitting: 41.9   Smokeless tobacco: Never  Vaping Use   Vaping status: Never Used  Substance and Sexual Activity   Alcohol use: Not Currently   Drug use: Not Currently    Types: Heroin, Marijuana    Comment: 1972 quit   Sexual activity: Not on file  Other Topics Concern   Not on file  Social History Narrative   Not on file   Social Drivers of Health   Financial Resource Strain: Not on file  Food Insecurity: Food Insecurity Present (10/17/2023)   Hunger Vital Sign    Worried About Running Out of Food in the Last Year: Sometimes true    Ran Out of Food in the Last Year: Sometimes true  Transportation Needs: No Transportation Needs (10/17/2023)   PRAPARE - Administrator, Civil Service (Medical): No    Lack of Transportation (Non-Medical): No  Physical Activity: Not on file  Stress: Not on file  Social Connections: Moderately Integrated (10/17/2023)   Social Connection and Isolation Panel    Frequency of Communication with Friends and Family: More than three times a week    Frequency of Social Gatherings with Friends and Family: More than three times a week    Attends Religious Services: More than 4 times per year    Active Member of [employer] or Organizations: No    Attends Banker Meetings: Never    Marital Status: Married  Catering Manager Violence: Not At Risk (10/17/2023)   Humiliation, Afraid, Rape, and Kick questionnaire    Fear of Current or Ex-Partner: No    Emotionally Abused: No    Physically Abused: No    Sexually Abused: No    Family History  Problem Relation Age of Onset   Colon cancer Mother      Review of Systems  Constitutional: Negative.  Negative for chills and fever.  HENT: Negative.  Negative for  congestion and sore throat.   Respiratory: Negative.  Negative for cough and shortness of breath.   Cardiovascular: Negative.  Negative for chest pain and palpitations.  Gastrointestinal:  Negative for abdominal pain, diarrhea, nausea and vomiting.  Genitourinary: Negative.  Negative for dysuria and hematuria.  Skin: Negative.  Negative for rash.  Neurological: Negative.  Negative for dizziness and headaches.  All other systems reviewed and are negative.   Today's Vitals   06/05/24 1420 06/05/24 1428  BP: (!) 140/80 (!) 140/80  Pulse: 65   Temp: (!) 97.4 F (36.3 C)   TempSrc: Oral   SpO2: 97%   Weight: 147 lb (66.7 kg)   Height: 5' 7 (1.702 m)    Body mass index is 23.02 kg/m.   Physical  Exam Vitals reviewed.  Constitutional:      Appearance: Normal appearance.  HENT:     Head: Normocephalic.     Mouth/Throat:     Mouth: Mucous membranes are moist.     Pharynx: Oropharynx is clear.  Eyes:     Extraocular Movements: Extraocular movements intact.     Conjunctiva/sclera: Conjunctivae normal.     Pupils: Pupils are equal, round, and reactive to light.  Cardiovascular:     Rate and Rhythm: Normal rate and regular rhythm.     Pulses: Normal pulses.     Heart sounds: Normal heart sounds.  Pulmonary:     Effort: Pulmonary effort is normal.     Breath sounds: Normal breath sounds.  Musculoskeletal:     Cervical back: No tenderness.  Lymphadenopathy:     Cervical: No cervical adenopathy.  Skin:    General: Skin is warm and dry.     Capillary Refill: Capillary refill takes less than 2 seconds.  Neurological:     General: No focal deficit present.     Mental Status: He is alert and oriented to person, place, and time.  Psychiatric:        Mood and Affect: Mood normal.        Behavior: Behavior normal.      ASSESSMENT & PLAN: A total of 42 minutes was spent with the patient and counseling/coordination of care regarding preparing for this visit, review of most  recent office visit notes, review of multiple chronic medical conditions and their management, review of all medications, review of most recent bloodwork results, review of health maintenance items, education on nutrition, prognosis, documentation, and need for follow up.   Problem List Items Addressed This Visit       Cardiovascular and Mediastinum   Primary hypertension - Primary   BP Readings from Last 3 Encounters:  06/05/24 130/70  11/04/23 (!) 150/87  11/01/23 (!) 142/98  Well-controlled hypertension Continue amlodipine  5 mg daily and valsartan  HCT 160-12.5 mg daily Cardiovascular risks associated with uncontrolled hypertension discussed Diet and nutrition discussed          Endocrine   Hypothyroidism (acquired)   Clinically euthyroid Continue Synthroid  75 mcg daily        Nervous and Auditory   Chronic bilateral low back pain with bilateral sciatica   Contributing to some of his gait difficulties Recommend physical therapy Referral placed today        Musculoskeletal and Integument   Onychomycosis of great toe   Advanced.  Recommend podiatry evaluation. Referral placed today.      Relevant Orders   Ambulatory referral to Podiatry     Other   Prediabetes   Lab Results  Component Value Date   HGBA1C 5.7 (H) 10/17/2023  Diet and nutrition discussed Stable.  No signs of diabetes       Dyslipidemia   Presently taking rosuvastatin  20 mg daily Diet and nutrition discussed       Balance problem   Recommend evaluation by physical therapy Referral placed today      Relevant Orders   PT gait training   Other Visit Diagnoses       Gait instability       Relevant Orders   PT gait training      Patient Instructions  Fungal Nail Infection A fungal nail infection is a common infection of the toenails or fingernails. This condition affects toenails more often than fingernails. It often affects the great, or big, toes.  More than one nail may be  infected. The condition can be passed from person to person (is contagious). What are the causes? This condition is caused by a fungus, such as yeast or molds. Several types of fungi can cause the infection. These fungi are common in moist and warm areas. If your hands or feet come into contact with the fungus, it may get into a crack in your fingernail or toenail or in the surrounding skin, and cause an infection. What increases the risk? The following factors may make you more likely to develop this condition: Being of older age. Having certain medical conditions, such as: Athlete's foot. Diabetes. Poor circulation. A weak body defense system (immune system). Walking barefoot in areas where the fungus thrives, such as showers or locker rooms. Wearing shoes and socks that cause your feet to sweat. Having a nail injury or a recent nail surgery. What are the signs or symptoms? Symptoms of this condition include: A pale spot on the nail. Thickening of the nail. A nail that becomes yellow, brown, or white. A brittle or ragged nail edge. A nail that has lifted away from the nail bed. How is this diagnosed? This condition is diagnosed with a physical exam. Your health care provider may take a scraping or clipping from your nail to test for the fungus. How is this treated? Treatment is not needed for mild infections. If you have significant nail changes, treatment may include: Antifungal medicines taken by mouth (orally). You may need to take the medicine for several weeks or several months, and you may not see the results for a long time. These medicines can cause side effects. Ask your health care provider what problems to watch for. Antifungal nail polish or nail cream. These may be used along with oral antifungal medicines. Laser treatment of the nail. Surgery to remove the nail. This may be needed for the most severe infections. It can take a long time, usually up to a year, for the  infection to go away. The infection may also come back. Follow these instructions at home: Medicines Take or apply over-the-counter and prescription medicines only as told by your health care provider. Ask your health care provider about using over-the-counter mentholated ointment on your nails. Nail care Trim your nails often. Wash and dry your hands and feet every day. Keep your feet dry. To do this: Wear absorbent socks, and change your socks frequently. Wear shoes that allow air to circulate, such as sandals or canvas tennis shoes. Throw out old shoes. If you go to a nail salon, make sure you choose one that uses clean instruments. Use antifungal foot powder on your feet and in your shoes. General instructions Do not share personal items, such as towels or nail clippers. Do not walk barefoot in shower rooms or locker rooms. Wear rubber gloves if you are working with your hands in wet areas. Keep all follow-up visits. This is important. Contact a health care provider if: You have redness, pain, or pus near the toenail or fingernail. Your infection is not getting better, or it is getting worse after several months. You have more circulation problems near the toenail or fingernail. You have brown or black discoloration of the nail that spreads to the surrounding skin. Summary A fungal nail infection is a common infection of the toenails or fingernails. Treatment is not needed for mild infections. If you have significant nail changes, treatment may include taking medicine orally and applying medicine to your nails.  It can take a long time, usually up to a year, for the infection to go away. The infection may also come back. Take or apply over-the-counter and prescription medicines only as told by your health care provider. This information is not intended to replace advice given to you by your health care provider. Make sure you discuss any questions you have with your health care  provider. Document Revised: 09/22/2020 Document Reviewed: 09/22/2020 Elsevier Patient Education  2024 Elsevier Inc.    Emil Schaumann, MD [facility]

## 2024-06-05 NOTE — Assessment & Plan Note (Signed)
 Contributing to some of his gait difficulties Recommend physical therapy Referral placed today

## 2024-06-06 DIAGNOSIS — M9903 Segmental and somatic dysfunction of lumbar region: Secondary | ICD-10-CM | POA: Diagnosis not present

## 2024-06-06 DIAGNOSIS — M5136 Other intervertebral disc degeneration, lumbar region with discogenic back pain only: Secondary | ICD-10-CM | POA: Diagnosis not present

## 2024-06-06 DIAGNOSIS — M545 Low back pain, unspecified: Secondary | ICD-10-CM | POA: Diagnosis not present

## 2024-06-06 DIAGNOSIS — M9905 Segmental and somatic dysfunction of pelvic region: Secondary | ICD-10-CM | POA: Diagnosis not present

## 2024-06-07 DIAGNOSIS — H401111 Primary open-angle glaucoma, right eye, mild stage: Secondary | ICD-10-CM | POA: Diagnosis not present

## 2024-06-07 DIAGNOSIS — H401122 Primary open-angle glaucoma, left eye, moderate stage: Secondary | ICD-10-CM | POA: Diagnosis not present

## 2024-06-12 ENCOUNTER — Ambulatory Visit: Admitting: Podiatry

## 2024-06-12 DIAGNOSIS — H401111 Primary open-angle glaucoma, right eye, mild stage: Secondary | ICD-10-CM | POA: Diagnosis not present

## 2024-06-12 DIAGNOSIS — H04123 Dry eye syndrome of bilateral lacrimal glands: Secondary | ICD-10-CM | POA: Diagnosis not present

## 2024-06-12 DIAGNOSIS — Z9889 Other specified postprocedural states: Secondary | ICD-10-CM | POA: Diagnosis not present

## 2024-06-12 DIAGNOSIS — H401122 Primary open-angle glaucoma, left eye, moderate stage: Secondary | ICD-10-CM | POA: Diagnosis not present

## 2024-06-14 ENCOUNTER — Telehealth: Payer: Self-pay

## 2024-06-14 NOTE — Telephone Encounter (Signed)
 Call patient and find out more information regarding his situation.  Provide letter as needed.
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
## 2024-06-14 NOTE — Telephone Encounter (Signed)
 Please advise

## 2024-06-14 NOTE — Telephone Encounter (Signed)
 Called patient and LVM.    1st attempt
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
## 2024-06-15 NOTE — Telephone Encounter (Unsigned)
 Copied from CRM #[id number]. Topic: General - Other >> Jun 15, 2024  9:26 AM Wess RAMAN wrote: Reason for CRM: Patient missed call from Rosalva Lex RAMAN, CMA.  Callback #: [phone]

## 2024-06-19 ENCOUNTER — Ambulatory Visit: Admitting: Podiatry

## 2024-06-19 ENCOUNTER — Encounter: Payer: Self-pay | Admitting: Emergency Medicine

## 2024-06-19 DIAGNOSIS — L603 Nail dystrophy: Secondary | ICD-10-CM

## 2024-06-19 NOTE — Telephone Encounter (Signed)
 I have written and placed inside office box to signed before mailing

## 2024-06-19 NOTE — Progress Notes (Signed)
 Subjective:  Patient ID: Christopher Arroyo, male    DOB: [birth year],  MRN: [id number]  Chief Complaint  Patient presents with   Nail Problem    78 y.o. male presents with the above complaint.  Patient presents with right hallux thickened regular dystrophic mycotic nail x 1 pain on palpation hurts with ambulation or shoe pressure he would like to have it removed he has been dealing with this for quite some time he does not want to oral medication to treat nail fungus.  Pain scale 7 out of 10 dull aching nature.   Review of Systems: Negative except as noted in the HPI. Denies N/V/F/Ch.  Past Medical History:  Diagnosis Date   Anxiety and depression    Current use of long term anticoagulation    DAPT (ASA + clopidogrel )   Diabetes mellitus without complication (HCC)    History of hiatal hernia    HLD (hyperlipidemia)    Hypertension    Hypothyroidism    PSA elevation    Right internal carotid artery aneurysm    s/p pipeline embolization on 08/08/2020   T2DM (type 2 diabetes mellitus) (HCC) 09/02/2020   TIA (transient ischemic attack) 2009   left hand weakness, decrease fine motor skills   Current Medications[1]  Tobacco Use History[2]  Allergies[3] Objective:  There were no vitals filed for this visit. There is no height or weight on file to calculate BMI. Constitutional Well developed. Well nourished.  Vascular Dorsalis pedis pulses palpable bilaterally. Posterior tibial pulses palpable bilaterally. Capillary refill normal to all digits.  No cyanosis or clubbing noted. Pedal hair growth normal.  Neurologic Normal speech. Oriented to person, place, and time. Epicritic sensation to light touch grossly present bilaterally.  Dermatologic Pain on palpation of the entire/total nail on 1st digit of the right No other open wounds. No skin lesions.  Orthopedic: Normal joint ROM without pain or crepitus bilaterally. No visible deformities. No bony tenderness.    Radiographs: None Assessment:   1. Nail dystrophy    Plan:  Patient was evaluated and treated and all questions answered.  Nail contusion/dystrophy hallux, right -Patient elects to proceed with minor surgery to remove entire toenail today. Consent reviewed and signed by patient. -Entire/total nail excised. See procedure note. -Educated on post-procedure care including soaking. Written instructions provided and reviewed. -Patient to follow up in 2 weeks for nail check.  Procedure: Excision of entire/total nail with phenol matricectomy Location: Right 1st toe digit Anesthesia: Lidocaine  1% plain; 1.5 mL and Marcaine 0.5% plain; 1.5 mL, digital block. Skin Prep: Betadine. Dressing: Silvadene; telfa; dry, sterile, compression dressing. Technique: Following skin prep, the toe was exsanguinated and a tourniquet was secured at the base of the toe. The affected nail border was freed and excised.  Phenol was applied in standard technique no complication noted.  The tourniquet was then removed and sterile dressing applied. Disposition: Patient tolerated procedure well. Patient to return in 2 weeks for follow-up.   No follow-ups on file.     [1]  Current Outpatient Medications:    amLODipine  (NORVASC ) 5 MG tablet, TAKE 1 TABLET (5 MG TOTAL) BY MOUTH DAILY., Disp: 90 tablet, Rfl: 3   aspirin  EC 81 MG tablet, Take 1 tablet (81 mg total) by mouth daily. Swallow whole., Disp: 30 tablet, Rfl: 12   [facility] ITCH RELIEF 2 % GEL, APPLY 1 APPLICATION. TOPICALLY IN THE MORNING AND AT BEDTIME., Disp: 118 g, Rfl: 2   dorzolamide -timolol  (COSOPT ) 2-0.5 % ophthalmic solution, INSTILL 1 DROP  INTO BOTH EYES TWICE A DAY, Disp: 10 mL, Rfl: 3   levothyroxine  (SYNTHROID ) 75 MCG tablet, TAKE 1 TABLET BY MOUTH DAILY BEFORE BREAKFAST., Disp: 90 tablet, Rfl: 1   Multiple Vitamin (MULTIVITAMIN WITH MINERALS) TABS tablet, Take 1 tablet by mouth daily., Disp: , Rfl:    rosuvastatin  (CRESTOR ) 20 MG tablet, TAKE 1 TABLET  BY MOUTH EVERY DAY, Disp: 90 tablet, Rfl: 1   rosuvastatin  (CRESTOR ) 40 MG tablet, Take 1 tablet (40 mg total) by mouth daily., Disp: 30 tablet, Rfl: 0   traZODone  (DESYREL ) 50 MG tablet, Take 0.5-1 tablets (25-50 mg total) by mouth at bedtime as needed for sleep., Disp: 30 tablet, Rfl: 3   valsartan -hydrochlorothiazide  (DIOVAN -HCT) 160-12.5 MG tablet, TAKE 1 TABLET BY MOUTH EVERY DAY, Disp: 90 tablet, Rfl: 3 [2]  Social History Tobacco Use  Smoking Status Former   Current packs/day: 0.00   Types: Cigarettes   Quit date: 1984   Years since quitting: 41.9  Smokeless Tobacco Never  [3] No Known Allergies

## 2024-06-19 NOTE — Telephone Encounter (Signed)
 Signed. Thanks.

## 2024-06-19 NOTE — Patient Instructions (Signed)

## 2024-06-20 NOTE — Telephone Encounter (Signed)
 I have dropped this off in the mailbox to be mailed to the patient address and I have called and informed him of this as well. He verbalized his gratitude and appreciation

## 2024-08-06 ENCOUNTER — Ambulatory Visit: Payer: Self-pay

## 2024-08-06 NOTE — Telephone Encounter (Signed)
 FYI Only or Action Required?: FYI only for provider: urgent care .  Patient was last seen in primary care on 06/05/2024 by Purcell Emil Schanz, MD.  Called Nurse Triage reporting Elbow Injury and Shoulder Injury.  Symptoms began 08/04/24.  Interventions attempted: OTC medications: Tylenol  and Ice application.  Symptoms are: unchanged.  Triage Disposition: See HCP Within 4 Hours (Or PCP Triage)  Patient/caregiver understands and will follow disposition?:   Reason for Disposition  Can't move injured elbow normally (i.e., bend or straighten completely)  Answer Assessment - Initial Assessment Questions Wednesday slipped on ice and fell backward landing on his back, he did not hit head. He had immediate left arm, shoulder and elbow pain. He had tingling in bilateral hands after fall that has resolved. Neck is sore. EMS evaluated him after fall, he was not transported to hospital. No bruising or open wounds. Has full ROM of motion but elbow is popping with movement. Neck is stiff and sore but has full ROM. Shoulder more painful with movement. Calling today for continued pain and stiffness. Urgent care advised, patient plans on [facility] today. Will call back for any follow up needs.   1. MECHANISM: How did the injury happen?     Flipped on ice landed on back 2. ONSET: When did the injury happen? (e.g., minutes, hours ago)      Wednesday  3. LOCATION: What part of the elbow is injured?      Left shoulder and elbow  4. APPEARANCE of INJURY: What does the injury look like?      No change 5. SEVERITY: Can you use the elbow normally?  Can you bend it and straighten it fully?     Popping noises 6. SIZE: For cuts, bruises, or swelling, ask: How large is it? (e.g., inches or centimeters; entire joint)      denies 7. PAIN: Is there pain? If Yes, ask: How bad is the pain?  (Scale 0-10; or none, mild, moderate, severe)     Varies, can be severe  8. TETANUS: For any breaks in the  skin, ask: When was your last tetanus booster?     No breaks in skin 9. OTHER SYMPTOMS: Do you have any other symptoms?  (e.g., numbness in hand)     Denies  Protocols used: Elbow Injury-A-AH Message from Montie POUR sent at 08/06/2024  9:14 AM EST  Reason for Triage: He fell last Wednesday on ice and he hurt his left shoulder. He fell backward and did not hit his head. Left elbow pops when he moves it. It is wanting to see if he should get an x-ray or what to do. Pain level up to a 7 and getting worse

## 2024-09-10 ENCOUNTER — Encounter
